# Patient Record
Sex: Female | Born: 1950 | Race: Black or African American | Hispanic: No | Marital: Married | Smoking: Never smoker
Health system: Southern US, Community
[De-identification: ages and names within clinical notes are randomized; demographics above are authoritative.]

## PROBLEM LIST (undated history)

## (undated) DIAGNOSIS — I82409 Acute embolism and thrombosis of unspecified deep veins of unspecified lower extremity: Secondary | ICD-10-CM

## (undated) DIAGNOSIS — I10 Essential (primary) hypertension: Secondary | ICD-10-CM

## (undated) DIAGNOSIS — E7800 Pure hypercholesterolemia, unspecified: Secondary | ICD-10-CM

## (undated) DIAGNOSIS — F329 Major depressive disorder, single episode, unspecified: Secondary | ICD-10-CM

## (undated) DIAGNOSIS — F32A Depression, unspecified: Secondary | ICD-10-CM

## (undated) DIAGNOSIS — Z8719 Personal history of other diseases of the digestive system: Secondary | ICD-10-CM

## (undated) DIAGNOSIS — M1990 Unspecified osteoarthritis, unspecified site: Secondary | ICD-10-CM

## (undated) HISTORY — PX: BACK SURGERY: SHX140

## (undated) HISTORY — PX: KNEE SURGERY: SHX244

## (undated) HISTORY — PX: ABDOMINAL HYSTERECTOMY: SHX81

## (undated) HISTORY — PX: APPENDECTOMY: SHX54

---

## 2006-09-09 ENCOUNTER — Ambulatory Visit (HOSPITAL_COMMUNITY): Admission: RE | Admit: 2006-09-09 | Discharge: 2006-09-09 | Payer: Self-pay | Admitting: Internal Medicine

## 2007-03-29 ENCOUNTER — Encounter: Admission: RE | Admit: 2007-03-29 | Discharge: 2007-03-29 | Payer: Self-pay | Admitting: Internal Medicine

## 2007-04-05 ENCOUNTER — Encounter: Admission: RE | Admit: 2007-04-05 | Discharge: 2007-04-05 | Payer: Self-pay | Admitting: Internal Medicine

## 2007-04-30 ENCOUNTER — Inpatient Hospital Stay (HOSPITAL_COMMUNITY): Admission: RE | Admit: 2007-04-30 | Discharge: 2007-05-03 | Payer: Self-pay | Admitting: Neurological Surgery

## 2007-06-01 ENCOUNTER — Encounter: Admission: RE | Admit: 2007-06-01 | Discharge: 2007-06-01 | Payer: Self-pay | Admitting: Neurological Surgery

## 2007-06-29 ENCOUNTER — Encounter: Admission: RE | Admit: 2007-06-29 | Discharge: 2007-06-29 | Payer: Self-pay | Admitting: Neurological Surgery

## 2007-10-19 ENCOUNTER — Emergency Department (HOSPITAL_BASED_OUTPATIENT_CLINIC_OR_DEPARTMENT_OTHER): Admission: EM | Admit: 2007-10-19 | Discharge: 2007-10-19 | Payer: Self-pay | Admitting: Emergency Medicine

## 2008-01-31 ENCOUNTER — Encounter: Admission: RE | Admit: 2008-01-31 | Discharge: 2008-01-31 | Payer: Self-pay | Admitting: Neurological Surgery

## 2008-06-06 ENCOUNTER — Encounter: Admission: RE | Admit: 2008-06-06 | Discharge: 2008-06-06 | Payer: Self-pay | Admitting: Neurological Surgery

## 2008-06-21 ENCOUNTER — Ambulatory Visit: Payer: Self-pay | Admitting: Diagnostic Radiology

## 2008-06-21 ENCOUNTER — Emergency Department (HOSPITAL_BASED_OUTPATIENT_CLINIC_OR_DEPARTMENT_OTHER): Admission: EM | Admit: 2008-06-21 | Discharge: 2008-06-21 | Payer: Self-pay | Admitting: Emergency Medicine

## 2008-09-17 ENCOUNTER — Emergency Department (HOSPITAL_BASED_OUTPATIENT_CLINIC_OR_DEPARTMENT_OTHER): Admission: EM | Admit: 2008-09-17 | Discharge: 2008-09-17 | Payer: Self-pay | Admitting: Emergency Medicine

## 2008-09-18 ENCOUNTER — Ambulatory Visit (HOSPITAL_BASED_OUTPATIENT_CLINIC_OR_DEPARTMENT_OTHER): Admission: RE | Admit: 2008-09-18 | Discharge: 2008-09-18 | Payer: Self-pay | Admitting: Emergency Medicine

## 2008-09-18 ENCOUNTER — Ambulatory Visit: Payer: Self-pay | Admitting: Diagnostic Radiology

## 2008-11-10 ENCOUNTER — Encounter (INDEPENDENT_AMBULATORY_CARE_PROVIDER_SITE_OTHER): Payer: Self-pay | Admitting: Orthopedic Surgery

## 2008-11-10 ENCOUNTER — Ambulatory Visit (HOSPITAL_BASED_OUTPATIENT_CLINIC_OR_DEPARTMENT_OTHER): Admission: RE | Admit: 2008-11-10 | Discharge: 2008-11-10 | Payer: Self-pay | Admitting: Orthopedic Surgery

## 2010-05-28 NOTE — Op Note (Signed)
NAMEMERLEAN, PIZZINI              ACCOUNT NO.:  [id number]   MEDICAL RECORD NO.:  [id number]          PATIENT TYPE:  INP   LOCATION:  3022                         FACILITY:  [facility]   PHYSICIAN:  Tia Alert, MD     DATE OF BIRTH:  [birth year]   DATE OF PROCEDURE:  04/30/2007  DATE OF DISCHARGE:                               OPERATIVE REPORT   PREOPERATIVE DIAGNOSIS:  Spondylolisthesis L4-L5 with spinal stenosis L4-  L5 with back and left leg pain.   POSTOPERATIVE DIAGNOSIS:  Spondylolisthesis L4-L5 with spinal stenosis  L4-L5 with back and left leg pain.   PROCEDURES:  1. Decompressive lumbar hemilaminectomy, hemi facetectomy, and      foraminotomies of L4-L5 with decompression of the L4 and the L5      nerve roots requiring more work than is typically required in      posterior lumbar interbody fusion procedure.  2. Posterior lumbar interbody fusion L4-L5 utilizing 10 x 22-mm PEEK      interbody cage packed with local autograft and Actifuse and a 10 x      22-mm Tangent interbody bone wedge.  3. Intertransverse arthrodesis L4-L5 utilizing locally harvested      morselized autologous bone graft and Actifuse putty.  4. Nonsegmental fixation L4-L5 utilizing the Legacy pedicle screw      system.   SURGEON:  Marikay Alar, MD.   ASSISTANT:  Kathaleen Maser. Pool, M.D.   ANESTHESIA:  General endotracheal.   COMPLICATIONS:  None apparent.   INDICATIONS FOR PROCEDURE:  Ms. Lindamood is a 60 year old female who is  referred with back pain and severe left leg pain.  She had an MRI which  showed significant degenerative disk disease at L5-S1 with Modic  endplate changes and a broad-based disk bulge with a degenerative  anterolisthesis of L4-L5 with stenosis at that level.  She had more of  an L5 radiculopathy on the left side.  I recommended a two-level placed  at L4-L5 and L5-S1.  She understood the risks, benefits, and expected  outcome and wished to proceed.   FINDINGS AT OPERATION:  At  the time of operation, we felt like the L5-S1  level was quite stable and likely was fused at the facet level.  We  found no movement when pulling on the spinous process with a Leksell  rongeur.  She did not have significant nerve compression at that level  though she did have degenerative disk disease but did not have a long  history of chronic mechanical diskogenic-like back pain and therefore,  we felt it was reasonable to proceed with PLIF at only L4-L5 where she  had segmental instability and spinal stenosis.  This was an  intraoperative decision.   DESCRIPTION OF PROCEDURE:  The patient was taken to the operating room  and after induction of adequate generalized endotracheal anesthesia, she  was placed in prone position on chest rolls and all pressure points were  padded.  Her lumbar region was prepped with DuraPrep and then draped in  the usual sterile fashion.  A 10 mL of local anesthesia was injected and  a dorsal midline incision was made and carried down through the  lumbosacral fascia.  The fascia was opened and the paraspinous  musculature was taken down in subperiosteal fashion to expose the L4-L5  and L5-S1.  Intraoperative fluoroscopy confirmed the level.  We pulled  on the spinous processes with Leksell rongeur.  The L4-L5 level was  quite loose, however, L5-S1 seemed to not move at all.  It seemed to be  fused at the facet level and the  fluoroscopic images made it look like  it was fused by bridging osteophyte at L5-S1.  We considered our  options.  We decided it was probably less risky to leave this alone and  not perform a decompression and an instrumented fusion at this level and  simply move forward with a decompression instrumented fusion at the L4-  L5 level where she had segmental instability and spinal stenosis,  especially given her preoperative symptoms.  Therefore, we removed the  spinous process of L4 and then utilizing the Kerrison punch and Leksell   rongeur, completed the laminectomy, hemi facetectomy, and foraminotomies  at L4-L5.  The underlying yellow ligament was opened and removed to  expose the underlying dura.  The L4 and L5 nerve roots were identified  and we marched along the pedicle walls to decompress these distally into  their respective foramina.  We coagulated the epidural venous  vasculature and identified our disk space.  We incised the disk space  and used sequential distraction to distract the disk space up to 10 mm  and had a fairly nice reduction of her spondylolisthesis.  We then used  the rotating cutter, round scraper and a 10-mm cutting chisel to prepare  the endplates for PLIF.  We prepared the midline with an Epstein  curette.  Once the complete diskectomy was performed, we placed a 10 x  22-mm Tangent interbody bone wedge on the patient's left side and 10 x  22-mm PEEK interbody cage packed with local autograft  and Actifuse on  the patient's right side and the midline was packed with local autograft  and Actifuse.  We then turned our attention to the nonsegmental  fixation.  We localized the pedicle screw entry zones at L4-L5 utilizing  surface landmarks and lateral fluoroscopy.  We probed each pedicle with  a pedicle probe, tapped each pedicle with a 5-5 tap then placed a 65 x  45-mm pedicle screws into the pedicles of L4-L5 after probing the  pedicles of the ball probe to assure no break in the cortex.  We then  decorticated the transverse processes and laid a mixture of autograft  and Actifuse out over these to complete our intertransverse arthrodesis  L4-L5.  We then placed lordotic rods into the multiaxial screw heads of  the pedicle screws and locked these into position with locking caps and  anti-torque device after achieving compression of our grafts.  We then  irrigated with saline solution containing bacitracin, dried all bleeding  points, lined the dura with Gelfoam, placed a medium Hemovac  drain  through a separate stab incision and then closed the muscle and the  fascia with 0 Vicryl, closed the subcutaneous and subcuticular tissue  with 2-0 and 3-0 Vicryl, and closed the skin with benzoin and Steri-  Strips.  The drapes were removed.  A sterile dressing was applied.  The  patient was awakened from general anesthesia and transferred to the  recovery room in stable condition.  At the end of the  procedure, all  sponge, needle, and instrument counts were correct.      Tia Alert, MD  Electronically Signed     DSJ/MEDQ  D:  04/30/2007  T:  05/01/2007  Job:  [id number]

## 2010-05-28 NOTE — Discharge Summary (Signed)
Kristin Anthony, Kristin Anthony              ACCOUNT NO.:  [id number]   MEDICAL RECORD NO.:  [id number]          PATIENT TYPE:  INP   LOCATION:  3022                         FACILITY:  [facility]   PHYSICIAN:  Tia Alert, MD     DATE OF BIRTH:  11/24/1950   DATE OF ADMISSION:  04/30/2007  DATE OF DISCHARGE:  05/03/2007                               DISCHARGE SUMMARY   ADMISSION DIAGNOSES:  1. Spondylolisthesis, L4-5 with spinal stenosis.  2. Back and left leg pain.   PROCEDURE:  Posterior lumbar interbody fusion, L4-5.   SECONDARY DIAGNOSIS:  Diabetes mellitus.   BRIEF HISTORY OF PRESENT ILLNESS:  Ms. Knouff is a 60 year old female  who is referred with back and severe left leg pain.  She had an MRI,  which showed degenerative disk disease at L5-S1 with motor endplate  changes and a anterior listhesis at L4-5 with associated stenosis.  She  had a left L5 radiculopathy.  We recommended a posterior lumbar  interbody fusion at L4-5.  She understood the risks, benefits, and  suspected outcome and wished to proceed.   HOSPITAL COURSE:  The patient was admitted on April 30, 2007, and taken  to the operating room where she underwent a posterior lumbar interbody  fusion at L4-5.  The patient tolerated the procedure well and taken to  recovery room and then to the floor in stable condition.  For details of  the operative procedure, please see the dictated operative note.  The  patient's hospital course was routine.  There were no complications.  She spent the first night at bedrest with a Dilaudid PCA protocol.  Foley catheter in place.  She was started on a sliding-scale insulin for  her diabetes mellitus and she was continued on her home Lantus and  Avandia.  She did very well following surgery.  She had improvement in  her radiculopathy.  She had appropriate back soreness.  She was allowed  out of bed on postop day #1, in a horseshoe brace.  She ambulated well  without difficulty.  Her incision  remained clean, dry, and intact.  She  was advanced to a regular diet, which she tolerated well.  Her blood  sugars ran in the low 200s to mid 100 and was nicely covered with  sliding-scale insulin.  Her Dilaudid PCA was discontinued on postop day  #2 and she did well with this on oral pain medications rating her back  pain about of 3/10 on the pain scale.  She denied any leg pain or any  numbness, tingling, or weakness.  Her incision remained clean, dry, and  intact.  She was afebrile with stable vital signs.  She was discharged  home in stable condition on May 03, 2007, with plans to follow up in 2  weeks.   FINAL DIAGNOSIS:  Posterior lumbar interbody fusion at L4-5.      Tia Alert, MD  Electronically Signed     DSJ/MEDQ  D:  05/03/2007  T:  05/03/2007  Job:  [id number]

## 2010-10-08 LAB — CBC
MCV: 91.8
Platelets: 202

## 2010-10-08 LAB — DIFFERENTIAL
Basophils Absolute: 0
Basophils Relative: 1
Lymphocytes Relative: 44
Monocytes Relative: 11
Neutro Abs: 2.6
Neutrophils Relative %: 41 — ABNORMAL LOW

## 2010-10-08 LAB — APTT: aPTT: 29

## 2010-10-08 LAB — BASIC METABOLIC PANEL
CO2: 25
Chloride: 102
GFR calc Af Amer: >60
Glucose, Bld: 155 — ABNORMAL HIGH
Potassium: 4.2
Sodium: 135

## 2010-10-08 LAB — POCT I-STAT GLUCOSE: Operator id: 125961

## 2010-10-08 LAB — TYPE AND SCREEN: ABO/RH(D): O POS

## 2010-10-08 LAB — PROTIME-INR
INR: 0.9
Prothrombin Time: 12.5

## 2010-10-12 ENCOUNTER — Emergency Department (HOSPITAL_BASED_OUTPATIENT_CLINIC_OR_DEPARTMENT_OTHER)
Admission: EM | Admit: 2010-10-12 | Discharge: 2010-10-12 | Disposition: A | Discharge status: Home / Self Care | Payer: Managed Care, Other (non HMO) | Attending: Emergency Medicine | Admitting: Emergency Medicine

## 2010-10-12 ENCOUNTER — Encounter: Payer: Self-pay | Admitting: *Deleted

## 2010-10-12 ENCOUNTER — Emergency Department (HOSPITAL_BASED_OUTPATIENT_CLINIC_OR_DEPARTMENT_OTHER): Payer: Managed Care, Other (non HMO)

## 2010-10-12 ENCOUNTER — Emergency Department (INDEPENDENT_AMBULATORY_CARE_PROVIDER_SITE_OTHER): Payer: Managed Care, Other (non HMO)

## 2010-10-12 DIAGNOSIS — M25559 Pain in unspecified hip: Secondary | ICD-10-CM

## 2010-10-12 DIAGNOSIS — I10 Essential (primary) hypertension: Secondary | ICD-10-CM | POA: Insufficient documentation

## 2010-10-12 DIAGNOSIS — E119 Type 2 diabetes mellitus without complications: Secondary | ICD-10-CM | POA: Insufficient documentation

## 2010-10-12 DIAGNOSIS — M5430 Sciatica, unspecified side: Secondary | ICD-10-CM | POA: Insufficient documentation

## 2010-10-12 DIAGNOSIS — X500XXA Overexertion from strenuous movement or load, initial encounter: Secondary | ICD-10-CM

## 2010-10-12 DIAGNOSIS — R109 Unspecified abdominal pain: Secondary | ICD-10-CM

## 2010-10-12 DIAGNOSIS — E7800 Pure hypercholesterolemia, unspecified: Secondary | ICD-10-CM | POA: Insufficient documentation

## 2010-10-12 HISTORY — DX: Essential (primary) hypertension: I10

## 2010-10-12 HISTORY — DX: Pure hypercholesterolemia, unspecified: E78.00

## 2010-10-12 MED ORDER — HYDROCODONE-ACETAMINOPHEN 5-500 MG PO TABS: 1.0000 | ORAL_TABLET | Freq: Four times a day (QID) | ORAL | Status: AC | PRN

## 2010-10-12 MED ORDER — PREDNISONE 10 MG PO TABS: 20.0000 mg | ORAL_TABLET | Freq: Every day | ORAL | Status: AC

## 2010-10-12 MED ORDER — KETOROLAC TROMETHAMINE 60 MG/2ML IM SOLN
60.0000 mg | Freq: Once | INTRAMUSCULAR | Status: AC
  Administered 2010-10-12: 60 mg via INTRAMUSCULAR
  Filled 2010-10-12: qty 2

## 2010-10-12 MED ORDER — OXYCODONE-ACETAMINOPHEN 5-325 MG PO TABS
2.0000 | ORAL_TABLET | Freq: Once | ORAL | Status: AC
  Administered 2010-10-12: 2 via ORAL
  Filled 2010-10-12: qty 2

## 2010-10-12 NOTE — ED Notes (Signed)
C/o right hip and side pain after doing heavy lifting

## 2010-10-12 NOTE — ED Provider Notes (Signed)
History     CSN: [id number] Arrival date & time: 10/12/2010  2:41 AM  Chief Complaint  Patient presents with  . Hip Pain    (Consider location/radiation/quality/duration/timing/severity/associated sxs/prior treatment) HPI Comments: Has been doing housework and carrying a heavy vacuum cleaner up the stairs.  No having bad pain in right buttock and hip.  Patient is a 60 y.o. female presenting with hip pain. The history is provided by the patient.  Hip Pain This is a new problem. The current episode started 2 days ago. The problem occurs constantly. The problem has been rapidly worsening. Pertinent negatives include no chest pain and no shortness of breath. The symptoms are aggravated by bending, standing and walking. The symptoms are relieved by nothing. She has tried ASA for the symptoms.    Past Medical History  Diagnosis Date  . Hypertension   . Diabetes mellitus   . High cholesterol     Past Surgical History  Procedure Date  . Back surgery   . Abdominal hysterectomy   . Appendectomy     History reviewed. No pertinent family history.  History  Substance Use Topics  . Smoking status: Never Smoker   . Smokeless tobacco: Not on file  . Alcohol Use: No    OB History    Grav Para Term Preterm Abortions TAB SAB Ect Mult Living                  Review of Systems  Respiratory: Negative for shortness of breath.   Cardiovascular: Negative for chest pain.  All other systems reviewed and are negative.    Allergies  Review of patient's allergies indicates not on file.  Home Medications   Current Outpatient Rx  Name Route Sig Dispense Refill  . AMLODIPINE BESY-BENAZEPRIL HCL 10-20 MG PO CAPS Oral Take 1 capsule by mouth daily.      Marland Kitchen ESCITALOPRAM OXALATE 10 MG PO TABS Oral Take 10 mg by mouth daily.      . INSULIN GLARGINE 100 UNIT/ML [place] SOLN Subcutaneous Inject into the skin at bedtime.      Marland Kitchen SIMVASTATIN 10 MG PO TABS Oral Take 10 mg by mouth at bedtime.         BP 122/67  Pulse 82  Temp(Src) 98.1 F (36.7 C) (Oral)  Resp 18  Ht 5\' 1"  (1.549 m)  Wt 188 lb (85.276 kg)  BMI 35.52 kg/m2  SpO2 100%  Physical Exam  Constitutional: She is oriented to person, place, and time. She appears well-developed and well-nourished.       Appears uncomfortable.  HENT:  Head: Normocephalic and atraumatic.  Neck: Normal range of motion. Neck supple.  Musculoskeletal:       There is ttp over the right hip and buttock along with pain with range of motion.    Neurological: She is alert and oriented to person, place, and time.       Dtr's are 2+ and equal in the ble.  Strength is 5/5 in the ble.    ED Course  Procedures (including critical care time)  Labs Reviewed - No data to display No results found.   No diagnosis found.    MDM  Xrays show chronic changes but no acute process.  Pain sounds like sciatica to me.          Geoffery Lyons, MD 10/12/10 [phone]

## 2011-02-12 ENCOUNTER — Other Ambulatory Visit: Payer: Self-pay | Admitting: Gastroenterology

## 2011-03-18 ENCOUNTER — Emergency Department (INDEPENDENT_AMBULATORY_CARE_PROVIDER_SITE_OTHER): Payer: Managed Care, Other (non HMO)

## 2011-03-18 ENCOUNTER — Emergency Department (HOSPITAL_BASED_OUTPATIENT_CLINIC_OR_DEPARTMENT_OTHER)
Admission: EM | Admit: 2011-03-18 | Discharge: 2011-03-18 | Disposition: A | Discharge status: Home / Self Care | Payer: Managed Care, Other (non HMO) | Attending: Emergency Medicine | Admitting: Emergency Medicine

## 2011-03-18 DIAGNOSIS — I10 Essential (primary) hypertension: Secondary | ICD-10-CM | POA: Insufficient documentation

## 2011-03-18 DIAGNOSIS — R109 Unspecified abdominal pain: Secondary | ICD-10-CM

## 2011-03-18 DIAGNOSIS — E7800 Pure hypercholesterolemia, unspecified: Secondary | ICD-10-CM | POA: Insufficient documentation

## 2011-03-18 DIAGNOSIS — R110 Nausea: Secondary | ICD-10-CM | POA: Insufficient documentation

## 2011-03-18 DIAGNOSIS — K7689 Other specified diseases of liver: Secondary | ICD-10-CM | POA: Insufficient documentation

## 2011-03-18 DIAGNOSIS — Z9079 Acquired absence of other genital organ(s): Secondary | ICD-10-CM | POA: Insufficient documentation

## 2011-03-18 DIAGNOSIS — K760 Fatty (change of) liver, not elsewhere classified: Secondary | ICD-10-CM

## 2011-03-18 DIAGNOSIS — E119 Type 2 diabetes mellitus without complications: Secondary | ICD-10-CM | POA: Insufficient documentation

## 2011-03-18 DIAGNOSIS — R10811 Right upper quadrant abdominal tenderness: Secondary | ICD-10-CM | POA: Insufficient documentation

## 2011-03-18 LAB — COMPREHENSIVE METABOLIC PANEL
ALT: 11 U/L (ref 0–35)
AST: 14 U/L (ref 0–37)
Albumin: 3.6 g/dL (ref 3.5–5.2)
Alkaline Phosphatase: 77 U/L (ref 39–117)
BUN: 14 mg/dL (ref 6–23)
Chloride: 103 mEq/L (ref 96–112)
Potassium: 3.9 mEq/L (ref 3.5–5.1)
Sodium: 137 mEq/L (ref 135–145)
Total Bilirubin: 0.2 mg/dL — ABNORMAL LOW (ref 0.3–1.2)
Total Protein: 7.1 g/dL (ref 6.0–8.3)

## 2011-03-18 LAB — CBC
HCT: 37 % (ref 36.0–46.0)
Hemoglobin: 12.2 g/dL (ref 12.0–15.0)
MCHC: 33 g/dL (ref 30.0–36.0)
MCV: 91.8 fL (ref 78.0–100.0)
RDW: 12.1 % (ref 11.5–15.5)

## 2011-03-18 LAB — LIPASE, BLOOD: Lipase: 40 U/L (ref 11–59)

## 2011-03-18 LAB — DIFFERENTIAL
Basophils Absolute: 0 10*3/uL (ref 0.0–0.1)
Basophils Relative: 1 % (ref 0–1)
Eosinophils Relative: 3 % (ref 0–5)
Monocytes Absolute: 0.7 10*3/uL (ref 0.1–1.0)
Monocytes Relative: 9 % (ref 3–12)
Neutro Abs: 3.2 10*3/uL (ref 1.7–7.7)

## 2011-03-18 LAB — URINALYSIS, ROUTINE W REFLEX MICROSCOPIC
Bilirubin Urine: NEGATIVE
Leukocytes, UA: NEGATIVE
Nitrite: NEGATIVE
Specific Gravity, Urine: 1.023 (ref 1.005–1.030)
Urobilinogen, UA: 2 mg/dL — ABNORMAL HIGH (ref 0.0–1.0)
pH: 6.5 (ref 5.0–8.0)

## 2011-03-18 MED ORDER — MORPHINE SULFATE 4 MG/ML IJ SOLN
4.0000 mg | Freq: Once | INTRAMUSCULAR | Status: AC
  Administered 2011-03-18: 4 mg via INTRAVENOUS
  Filled 2011-03-18: qty 1

## 2011-03-18 MED ORDER — HYDROCODONE-ACETAMINOPHEN 5-500 MG PO TABS: 1.0000 | ORAL_TABLET | Freq: Four times a day (QID) | ORAL | Status: AC | PRN

## 2011-03-18 MED ORDER — ONDANSETRON HCL 4 MG/2ML IJ SOLN
4.0000 mg | Freq: Once | INTRAMUSCULAR | Status: AC
  Administered 2011-03-18: 4 mg via INTRAVENOUS
  Filled 2011-03-18: qty 2

## 2011-03-18 NOTE — ED Provider Notes (Signed)
History     CSN: [id number]  Arrival date & time 03/18/11  1503   First MD Initiated Contact with Patient 03/18/11 1521      Chief Complaint  Patient presents with  . Abdominal Pain    Pt. reports after she eats she has onset of R side abd. pain.    (Consider location/radiation/quality/duration/timing/severity/associated sxs/prior treatment) HPI Comments: Pt states that she has had intermittent right sided upper abdominal pain over the last 2 months, but it seems to be increasing in nature:pt states that it is worse with eating although she is unsure if it is all food or certain foods  Patient is a 61 y.o. female presenting with abdominal pain. The history is provided by the patient. No language interpreter was used.  Abdominal Pain The primary symptoms of the illness include abdominal pain and nausea. The primary symptoms of the illness do not include fever, vomiting or diarrhea. The current episode started more than 2 days ago. The onset of the illness was gradual.  Symptoms associated with the illness do not include urgency, frequency or back pain.    Past Medical History  Diagnosis Date  . Hypertension   . Diabetes mellitus   . High cholesterol     Past Surgical History  Procedure Date  . Back surgery   . Abdominal hysterectomy   . Appendectomy     No family history on file.  History  Substance Use Topics  . Smoking status: Never Smoker   . Smokeless tobacco: Not on file  . Alcohol Use: No    OB History    Grav Para Term Preterm Abortions TAB SAB Ect Mult Living                  Review of Systems  Constitutional: Negative for fever.  Gastrointestinal: Positive for nausea and abdominal pain. Negative for vomiting and diarrhea.  Genitourinary: Negative for urgency and frequency.  Musculoskeletal: Negative for back pain.  All other systems reviewed and are negative.    Allergies  Codeine  Home Medications   Current Outpatient Rx  Name Route Sig  Dispense Refill  . METFORMIN HCL 1000 MG PO TABS Oral Take 1,000 mg by mouth 2 (two) times daily with a meal.    . PIOGLITAZONE HCL 15 MG PO TABS Oral Take 15 mg by mouth daily.    Marland Kitchen AMLODIPINE BESY-BENAZEPRIL HCL 10-20 MG PO CAPS Oral Take 1 capsule by mouth daily.      Marland Kitchen ESCITALOPRAM OXALATE 10 MG PO TABS Oral Take 10 mg by mouth daily.      . INSULIN GLARGINE 100 UNIT/ML [place] SOLN Subcutaneous Inject into the skin at bedtime.      Marland Kitchen SIMVASTATIN 10 MG PO TABS Oral Take 10 mg by mouth at bedtime.        BP 137/62  Pulse 83  Temp(Src) 98.3 F (36.8 C) (Oral)  Resp 18  Ht 5\' 9"  (1.753 m)  Wt 188 lb (85.276 kg)  BMI 27.76 kg/m2  SpO2 98%  Physical Exam  Nursing note and vitals reviewed. Constitutional: She is oriented to person, place, and time. She appears well-developed and well-nourished.  HENT:  Head: Normocephalic and atraumatic.  Cardiovascular: Normal rate and regular rhythm.   Pulmonary/Chest: Effort normal.  Abdominal: Soft. Bowel sounds are normal. There is tenderness in the right upper quadrant.  Musculoskeletal: Normal range of motion.  Neurological: She is alert and oriented to person, place, and time.  Skin: Skin is warm  and dry.  Psychiatric: She has a normal mood and affect.    ED Course  Procedures (including critical care time)  Labs Reviewed  URINALYSIS, ROUTINE W REFLEX MICROSCOPIC - Abnormal; Notable for the following:    Urobilinogen, UA 2.0 (*)    All other components within normal limits  COMPREHENSIVE METABOLIC PANEL - Abnormal; Notable for the following:    Glucose, Bld 170 (*)    Total Bilirubin 0.2 (*)    GFR calc non Af Amer 67 (*)    GFR calc Af Amer 78 (*)    All other components within normal limits  LIPASE, BLOOD  CBC  DIFFERENTIAL   US Abdomen Complete  03/18/2011  *RADIOLOGY REPORT*  Clinical Data:  Post prandial right-sided abdominal pain for the past 2 months.  COMPLETE ABDOMINAL ULTRASOUND  Comparison:  09/09/2006  Findings:   Gallbladder:  No gallstones, gallbladder wall thickening, or pericholecystic fluid.  Common bile duct:  Measures up to 6 mm in diameter, within normal limits.  Liver:  Echogenic, suggesting diffuse hepatic steatosis.  No focal lesion observed.  IVC:  Normal where visualized; portions not well seen due to overlying bowel gas.  Pancreas:  Not well seen due to overlying bowel gas.  Spleen:  Measures 5.1 cm craniocaudad and appears normal.  Right Kidney:  Measures 11.2 cm in length and appears normal.  Left Kidney:  Measures 11.8 cm in length and appears normal.  Abdominal aorta:  Normal distally, not well seen proximally or midportion.  IMPRESSION:  1.  Echogenic liver, suggesting diffuse hepatic steatosis. 2.  Poor visualization of portions of the pancreas, abdominal aorta, and IVC due to overlying bowel gas. 3.   Otherwise, no significant abnormality identified.  Original Report Authenticated By: Dellia Cloud, M.D.     1. Abdominal pain   2. Hepatic steatosis       MDM  Discussed fatty liver with pt:no other acute finding:will send home and treat pt symptomatically       Teressa Lower, NP 03/18/11 1738

## 2011-03-18 NOTE — Discharge Instructions (Signed)
Abdominal Pain Abdominal pain can be caused by many things. Your caregiver decides the seriousness of your pain by an examination and possibly blood tests and X-rays. Many cases can be observed and treated at home. Most abdominal pain is not caused by a disease and will probably improve without treatment. However, in many cases, more time must pass before a clear cause of the pain can be found. Before that point, it may not be known if you need more testing, or if hospitalization or surgery is needed. HOME CARE INSTRUCTIONS   Do not take laxatives unless directed by your caregiver.   Take pain medicine only as directed by your caregiver.   Only take over-the-counter or prescription medicines for pain, discomfort, or fever as directed by your caregiver.   Try a clear liquid diet (broth, tea, or water) for as long as directed by your caregiver. Slowly move to a bland diet as tolerated.  SEEK IMMEDIATE MEDICAL CARE IF:   The pain does not go away.   You have a fever.   You keep throwing up (vomiting).   The pain is felt only in portions of the abdomen. Pain in the right side could possibly be appendicitis. In an adult, pain in the left lower portion of the abdomen could be colitis or diverticulitis.   You pass bloody or black tarry stools.  MAKE SURE YOU:   Understand these instructions.   Will watch your condition.   Will get help right away if you are not doing well or get worse.  Document Released: 10/09/2004 Document Revised: 12/19/2010 Document Reviewed: 08/18/2007 [facility] Patient Information 2012 [place], [place].Fatty Liver Fatty liver is the accumulation of fat in liver cells. It is also called hepatosteatosis or steatohepatitis. It is normal for your liver to contain some fat. If fat is more than 5 to 10% of your liver's weight, you have fatty liver.  There are often no symptoms (problems) for years while damage is still occurring. People often learn about their fatty liver when  they have medical tests for other reasons. Fat can damage your liver for years or even decades without causing problems. When it becomes severe, it can cause fatigue, weight loss, weakness, and confusion. This makes you more likely to develop more serious liver problems. The liver is the largest organ in the body. It does a lot of work and often gives no warning signs when it is sick until late in a disease. The liver has many important jobs including:  Breaking down foods.   Storing vitamins, iron, and other minerals.   Making proteins.   Making bile for food digestion.   Breaking down many products including medications, alcohol and some poisons.  CAUSES  There are a number of different conditions, medications, and poisons that can cause a fatty liver. Eating too many calories causes fat to build up in the liver. Not processing and breaking fats down normally may also cause this. Certain conditions, such as obesity, diabetes, and high triglycerides also cause this. Most fatty liver patients tend to be middle-aged and over weight.  Some causes of fatty liver are:  Alcohol over consumption.   Malnutrition.   Steroid use.   Valproic acid toxicity.   Obesity.   Cushing's syndrome.   Poisons.   Tetracycline in high dosages.   Pregnancy.   Diabetes.   Hyperlipidemia.   Rapid weight loss.  Some people develop fatty liver even having none of these conditions. SYMPTOMS  Fatty liver most often causes  no problems. This is called asymptomatic.  It can be diagnosed with blood tests and also by a liver biopsy.   It is one of the most common causes of minor elevations of liver enzymes on routine blood tests.   Specialized Imaging of the liver using ultrasound, CT (computed tomography) scan, or MRI (magnetic resonance imaging) can suggest a fatty liver but a biopsy is needed to confirm it.   A biopsy involves taking a small sample of liver tissue. This is done by using a needle.  It is then looked at under a microscope by a specialist.  TREATMENT  It is important to treat the cause. Simple fatty liver without a medical reason may not need treatment.  Weight loss, fat restriction, and exercise in overweight patients produces inconsistent results but is worth trying.   Fatty liver due to alcohol toxicity may not improve even with stopping drinking.   Good control of diabetes may reduce fatty liver.   Lower your triglycerides through diet, medication or both.   Eat a balanced, healthy diet.   Increase your physical activity.   Get regular checkups from a liver specialist.   There are no medical or surgical treatments for a fatty liver or NASH, but improving your diet and increasing your exercise may help prevent or reverse some of the damage.  PROGNOSIS  Fatty liver may cause no damage or it can lead to an inflammation of the liver. This is, called steatohepatitis. When it is linked to alcohol abuse, it is called alcoholic steatohepatitis. It often is not linked to alcohol. It is then called nonalcoholic steatohepatitis, or NASH. Over time the liver may become scarred and hardened. This condition is called cirrhosis. Cirrhosis is serious and may lead to liver failure or cancer. NASH is one of the leading causes of cirrhosis. About 10-20% of Americans have fatty liver and a smaller 2-5% has NASH. Document Released: 02/14/2005 Document Revised: 12/19/2010 Document Reviewed: 04/09/2005 [facility] Patient Information 2012 [place], [place].

## 2011-03-18 NOTE — ED Notes (Signed)
Pt. Reports she is in pain in the R side of the abd. At present time.  Pt. Ate lunch at approx. 1430 today.  Pt. Reports she ate grits today.

## 2011-03-19 NOTE — ED Provider Notes (Signed)
Medical screening examination/treatment/procedure(s) were performed by non-physician practitioner and as supervising physician I was immediately available for consultation/collaboration.   Geoffery Lyons, MD 03/19/11 [phone]

## 2011-07-25 ENCOUNTER — Emergency Department (HOSPITAL_BASED_OUTPATIENT_CLINIC_OR_DEPARTMENT_OTHER)
Admission: EM | Admit: 2011-07-25 | Discharge: 2011-07-25 | Disposition: A | Discharge status: Home / Self Care | Payer: Managed Care, Other (non HMO) | Attending: Emergency Medicine | Admitting: Emergency Medicine

## 2011-07-25 ENCOUNTER — Encounter (HOSPITAL_BASED_OUTPATIENT_CLINIC_OR_DEPARTMENT_OTHER): Payer: Self-pay

## 2011-07-25 DIAGNOSIS — Z79899 Other long term (current) drug therapy: Secondary | ICD-10-CM | POA: Insufficient documentation

## 2011-07-25 DIAGNOSIS — E7800 Pure hypercholesterolemia, unspecified: Secondary | ICD-10-CM | POA: Insufficient documentation

## 2011-07-25 DIAGNOSIS — I10 Essential (primary) hypertension: Secondary | ICD-10-CM | POA: Insufficient documentation

## 2011-07-25 DIAGNOSIS — Z794 Long term (current) use of insulin: Secondary | ICD-10-CM | POA: Insufficient documentation

## 2011-07-25 DIAGNOSIS — E119 Type 2 diabetes mellitus without complications: Secondary | ICD-10-CM | POA: Insufficient documentation

## 2011-07-25 DIAGNOSIS — M7660 Achilles tendinitis, unspecified leg: Secondary | ICD-10-CM

## 2011-07-25 MED ORDER — NAPROXEN 500 MG PO TABS: 500.0000 mg | ORAL_TABLET | Freq: Two times a day (BID) | ORAL | Status: AC

## 2011-07-25 MED ORDER — HYDROCODONE-ACETAMINOPHEN 5-325 MG PO TABS: ORAL_TABLET | ORAL | Status: AC

## 2011-07-25 MED ORDER — IBUPROFEN 400 MG PO TABS
600.0000 mg | ORAL_TABLET | Freq: Once | ORAL | Status: AC
  Administered 2011-07-25: 600 mg via ORAL
  Filled 2011-07-25: qty 1

## 2011-07-25 NOTE — ED Notes (Signed)
MD at bedside. 

## 2011-07-25 NOTE — Discharge Instructions (Signed)
 Achilles Tendinitis Tendinitis a swelling and soreness of the tendon. The pain in the tendon (cord-like structure which attaches muscle to bone) is produced by tiny tears and the inflammation present in that tendon. It commonly occurs at the shoulders, heels, and elbows. It is usually caused by overusing the tendon and joint involved. Achilles tendinitis involves the Achilles tendon. This is the large tendon in the back of the leg just above the foot. It attaches the large muscles of the lower leg to the heel bone (called calcaneus).  This diagnosis (learning what is wrong) is made by examination. X-rays will be generally be normal if only tendinitis is present. HOME CARE INSTRUCTIONS   Apply ice to the injury for 15 to 20 minutes, 3 to 4 times per day. Put the ice in a plastic bag and place a towel between the bag of ice and your skin.   Try to avoid use other than gentle range of motion while the tendon is painful. Do not resume use until instructed by your caregiver. Then begin use gradually. Do not increase use to the point of pain. If pain does develop, decrease use and continue the above measures. Gradually increase activities that do not cause discomfort until you gradually achieve normal use.   Only take over-the-counter or prescription medicines for pain, discomfort, or fever as directed by your caregiver.  SEEK MEDICAL CARE IF:   Your pain and swelling increase or pain is uncontrolled with medications.   You develop new, unexplained problems (symptoms) or an increase of the symptoms that brought you to your caregiver.   You develop an inability to move your toes or foot, develop warmth and swelling in your foot, or begin running an unexplained temperature.  MAKE SURE YOU:   Understand these instructions.   Will watch your condition.   Will get help right away if you are not doing well or get worse.  Document Released: 10/09/2004 Document Revised: 12/19/2010 Document Reviewed:  08/18/2007 [facility] Patient Information 2012 [place], [place].    Narcotic and benzodiazepine use may cause drowsiness, slowed breathing or dependence.  Please use with caution and do not drive, operate machinery or watch young children alone while taking them.  Taking combinations of these medications or drinking alcohol  will potentiate these effects.

## 2011-07-25 NOTE — ED Provider Notes (Signed)
History     CSN: [id number]  Arrival date & time 07/25/11  1002   First MD Initiated Contact with Patient 07/25/11 1009      Chief Complaint  Patient presents with  . Ankle Pain  . Foot Pain    (Consider location/radiation/quality/duration/timing/severity/associated sxs/prior treatment) HPI Comments: Pt reports severe pain to back of ankle along achilles of left foot without injury.  No abrasions, laceration, no twist of ankle.  She is disabled due to back problems, denies running, jumping, lifting or pushing anything sig.  No h/o gout.  No redness to skin.  She had wrapped it, put ice on it without much relief.  Lasting for 1+ days.   No other joint or limb problems.  Denies numbness or weakness.  Movement of ankle and letting leg hang down makes worse.    Patient is a 61 y.o. female presenting with ankle pain and lower extremity pain. The history is provided by the patient.  Ankle Pain  Pertinent negatives include no numbness.  Foot Pain    Past Medical History  Diagnosis Date  . Hypertension   . Diabetes mellitus   . High cholesterol     Past Surgical History  Procedure Date  . Back surgery   . Abdominal hysterectomy   . Appendectomy     History reviewed. No pertinent family history.  History  Substance Use Topics  . Smoking status: Never Smoker   . Smokeless tobacco: Not on file  . Alcohol Use: No    OB History    Grav Para Term Preterm Abortions TAB SAB Ect Mult Living                  Review of Systems  Constitutional: Negative for fever and chills.  Musculoskeletal: Positive for joint swelling and arthralgias.  Skin: Negative for color change, rash and wound.  Neurological: Positive for weakness. Negative for numbness.    Allergies  Codeine  Home Medications   Current Outpatient Rx  Name Route Sig Dispense Refill  . AMLODIPINE BESY-BENAZEPRIL HCL 10-20 MG PO CAPS Oral Take 1 capsule by mouth daily.      . ASPIRIN 81 MG PO TABS Oral Take 81  mg by mouth daily.    Marland Kitchen ESCITALOPRAM OXALATE 10 MG PO TABS Oral Take 10 mg by mouth daily.      Marland Kitchen HYDROCODONE-ACETAMINOPHEN 5-325 MG PO TABS  1-2 tablets po q 6 hours prn moderate to severe pain 20 tablet 0  . INSULIN GLARGINE 100 UNIT/ML [place] SOLN Subcutaneous Inject 32 Units into the skin at bedtime.     Marland Kitchen NAPROXEN 500 MG PO TABS Oral Take 1 tablet (500 mg total) by mouth 2 (two) times daily. Take with food to help prevent upset stomach. Discontinue if severe abdominal pain occurs. 20 tablet 0  . PIOGLITAZONE HCL-METFORMIN HCL 15-500 MG PO TABS Oral Take 1 tablet by mouth 2 (two) times daily with a meal.    . SIMVASTATIN 10 MG PO TABS Oral Take 10 mg by mouth at bedtime.        BP 128/73  Pulse 107  Temp 98.2 F (36.8 C) (Oral)  Resp 16  Ht 5\' 7"  (1.702 m)  Wt 188 lb (85.276 kg)  BMI 29.44 kg/m2  SpO2 98%  Physical Exam  Nursing note and vitals reviewed. Constitutional: She appears well-developed and well-nourished.  Cardiovascular:  Pulses:      Dorsalis pedis pulses are 2+ on the left side.  Posterior tibial pulses are 2+ on the left side.  Musculoskeletal: She exhibits no edema.       Left ankle: She exhibits swelling. She exhibits normal range of motion, no deformity, no laceration and normal pulse. No lateral malleolus, no medial malleolus, no head of 5th metatarsal and no proximal fibula tenderness found. Achilles tendon exhibits pain. Achilles tendon exhibits no defect and normal Thompson's test results.       Feet:  Neurological: She is alert. She has normal strength. No sensory deficit.  Skin: Skin is warm, dry and intact. No abrasion, no ecchymosis, no laceration and no rash noted. No pallor.    ED Course  Procedures (including critical care time)  Labs Reviewed - No data to display No results found.   1. Achilles tendonitis       MDM  Atraumatic.  Focal pain, consistent with tendonitis.  No warmth, rednesss, no rash, intact sensation, not weak, except  slightly limited due to pain.  RICE at home, analgesic prescription.          Gavin Pound. Cashmere Harmes, MD 07/25/11 1032

## 2011-07-25 NOTE — ED Notes (Signed)
Pt reports she awakened with ankle/heel pain this am.  Denies injury.

## 2012-01-25 ENCOUNTER — Emergency Department (HOSPITAL_BASED_OUTPATIENT_CLINIC_OR_DEPARTMENT_OTHER)
Admission: EM | Admit: 2012-01-25 | Discharge: 2012-01-25 | Discharge status: Against Medical Advice | Payer: Managed Care, Other (non HMO) | Attending: Emergency Medicine | Admitting: Emergency Medicine

## 2012-01-25 ENCOUNTER — Encounter (HOSPITAL_BASED_OUTPATIENT_CLINIC_OR_DEPARTMENT_OTHER): Payer: Self-pay | Admitting: *Deleted

## 2012-01-25 DIAGNOSIS — K089 Disorder of teeth and supporting structures, unspecified: Secondary | ICD-10-CM | POA: Insufficient documentation

## 2012-01-25 NOTE — ED Notes (Signed)
C/o right upper tooth pain that started one week ago. Denies fevers. Taking ibuprofen without relief. Does  Not currently have a dentist.

## 2012-07-07 ENCOUNTER — Emergency Department (HOSPITAL_BASED_OUTPATIENT_CLINIC_OR_DEPARTMENT_OTHER): Payer: Managed Care, Other (non HMO)

## 2012-07-07 ENCOUNTER — Emergency Department (HOSPITAL_BASED_OUTPATIENT_CLINIC_OR_DEPARTMENT_OTHER)
Admission: EM | Admit: 2012-07-07 | Discharge: 2012-07-07 | Disposition: A | Discharge status: Home / Self Care | Payer: Managed Care, Other (non HMO) | Attending: Emergency Medicine | Admitting: Emergency Medicine

## 2012-07-07 ENCOUNTER — Encounter (HOSPITAL_BASED_OUTPATIENT_CLINIC_OR_DEPARTMENT_OTHER): Payer: Self-pay | Admitting: Family Medicine

## 2012-07-07 DIAGNOSIS — S61219A Laceration without foreign body of unspecified finger without damage to nail, initial encounter: Secondary | ICD-10-CM

## 2012-07-07 DIAGNOSIS — S62639A Displaced fracture of distal phalanx of unspecified finger, initial encounter for closed fracture: Secondary | ICD-10-CM | POA: Insufficient documentation

## 2012-07-07 DIAGNOSIS — S6710XA Crushing injury of unspecified finger(s), initial encounter: Secondary | ICD-10-CM | POA: Insufficient documentation

## 2012-07-07 DIAGNOSIS — E119 Type 2 diabetes mellitus without complications: Secondary | ICD-10-CM | POA: Insufficient documentation

## 2012-07-07 DIAGNOSIS — I10 Essential (primary) hypertension: Secondary | ICD-10-CM | POA: Insufficient documentation

## 2012-07-07 DIAGNOSIS — E7800 Pure hypercholesterolemia, unspecified: Secondary | ICD-10-CM | POA: Insufficient documentation

## 2012-07-07 DIAGNOSIS — Y9289 Other specified places as the place of occurrence of the external cause: Secondary | ICD-10-CM | POA: Insufficient documentation

## 2012-07-07 DIAGNOSIS — Y9389 Activity, other specified: Secondary | ICD-10-CM | POA: Insufficient documentation

## 2012-07-07 DIAGNOSIS — W230XXA Caught, crushed, jammed, or pinched between moving objects, initial encounter: Secondary | ICD-10-CM | POA: Insufficient documentation

## 2012-07-07 DIAGNOSIS — Z794 Long term (current) use of insulin: Secondary | ICD-10-CM | POA: Insufficient documentation

## 2012-07-07 DIAGNOSIS — Z79899 Other long term (current) drug therapy: Secondary | ICD-10-CM | POA: Insufficient documentation

## 2012-07-07 DIAGNOSIS — S61209A Unspecified open wound of unspecified finger without damage to nail, initial encounter: Secondary | ICD-10-CM | POA: Insufficient documentation

## 2012-07-07 DIAGNOSIS — Z23 Encounter for immunization: Secondary | ICD-10-CM | POA: Insufficient documentation

## 2012-07-07 MED ORDER — GELATIN ABSORBABLE 12-7 MM EX MISC
1.0000 | Freq: Once | CUTANEOUS | Status: AC
  Administered 2012-07-07: 1 via TOPICAL
  Filled 2012-07-07: qty 1

## 2012-07-07 MED ORDER — CEPHALEXIN 500 MG PO CAPS: 500.0000 mg | ORAL_CAPSULE | Freq: Four times a day (QID) | ORAL | Status: DC

## 2012-07-07 MED ORDER — TETANUS-DIPHTH-ACELL PERTUSSIS 5-2.5-18.5 LF-MCG/0.5 IM SUSP
0.5000 mL | Freq: Once | INTRAMUSCULAR | Status: AC
  Administered 2012-07-07: 0.5 mL via INTRAMUSCULAR
  Filled 2012-07-07: qty 0.5

## 2012-07-07 MED ORDER — HYDROCODONE-ACETAMINOPHEN 5-325 MG PO TABS: 2.0000 | ORAL_TABLET | ORAL | Status: DC | PRN

## 2012-07-07 NOTE — ED Provider Notes (Signed)
History    CSN: [id number] Arrival date & time 07/07/12  1343  First MD Initiated Contact with Patient 07/07/12 1516     Chief Complaint  Patient presents with  . Laceration   (Consider location/radiation/quality/duration/timing/severity/associated sxs/prior Treatment) Patient is a 62 y.o. female presenting with skin laceration. The history is provided by the patient. No language interpreter was used.  Laceration Location:  Finger Finger laceration location:  R thumb Length (cm):  0.4 Quality: straight   Bleeding: venous and controlled   Pain details:    Quality:  Aching   Severity:  Moderate   Timing:  Constant Relieved by:  Nothing Pt reports she crushed her finger in a car door Past Medical History  Diagnosis Date  . Hypertension   . Diabetes mellitus   . High cholesterol    Past Surgical History  Procedure Laterality Date  . Back surgery    . Abdominal hysterectomy    . Appendectomy     No family history on file. History  Substance Use Topics  . Smoking status: Never Smoker   . Smokeless tobacco: Not on file  . Alcohol Use: No   OB History   Grav Para Term Preterm Abortions TAB SAB Ect Mult Living                 Review of Systems  All other systems reviewed and are negative.    Allergies  Codeine  Home Medications   Current Outpatient Rx  Name  Route  Sig  Dispense  Refill  . amLODipine-benazepril (LOTREL) 10-20 MG per capsule   Oral   Take 1 capsule by mouth daily.           Marland Kitchen aspirin 81 MG tablet   Oral   Take 81 mg by mouth daily.         Marland Kitchen escitalopram (LEXAPRO) 10 MG tablet   Oral   Take 10 mg by mouth daily.           . insulin glargine (LANTUS) 100 UNIT/ML injection   Subcutaneous   Inject 32 Units into the skin at bedtime.          . naproxen (NAPROSYN) 500 MG tablet   Oral   Take 1 tablet (500 mg total) by mouth 2 (two) times daily. Take with food to help prevent upset stomach. Discontinue if severe abdominal pain  occurs.   20 tablet   0   . pioglitazone-metformin (ACTOPLUS MET) 15-500 MG per tablet   Oral   Take 1 tablet by mouth 2 (two) times daily with a meal.         . simvastatin (ZOCOR) 10 MG tablet   Oral   Take 10 mg by mouth at bedtime.            BP 127/67  Pulse 84  Temp(Src) 98.6 F (37 C) (Oral)  SpO2 98% Physical Exam  Constitutional: She is oriented to person, place, and time. She appears well-developed and well-nourished.  Musculoskeletal: She exhibits tenderness.  5mm laceration lateral aspect of thumb,  No current bleeding,  nv and ns intact  Neurological: She is alert and oriented to person, place, and time. She has normal reflexes.  Skin: Skin is warm.  Psychiatric: She has a normal mood and affect.    ED Course  Procedures (including critical care time) Labs Reviewed - No data to display Dg Finger Thumb Right  07/07/2012   *RADIOLOGY REPORT*  Clinical Data: Thumb injury with  laceration and bleeding.  RIGHT THUMB 2+V  Comparison: None.  Findings: There is cortical irregularity and an obliquely oriented lucency in the distal phalangeal tuft, seen best on the lateral view.  Probable mild degenerative change along the base of the distal phalanx.  Soft tissue swelling without radiopaque foreign body.  IMPRESSION: Strongly suspect a nondisplaced distal phalangeal tuft fracture.   Original Report Authenticated By: Leanna Battles, M.D.   1. Laceration of finger, complicated, initial encounter   2. Fracture of distal phalanx of finger, closed, initial encounter     MDM  Gelfoam to area,  Splint.   Wound recheck in 2 days.   Keflex 500mg  28 qid and hydrocodone  Lonia Skinner [place], [place] 07/07/12 1533

## 2012-07-07 NOTE — ED Notes (Signed)
Pt c/o slamming right thumb in truck door and can't get bleeding to stop to right thumb. Bleeding controlled at present with dressing.

## 2012-07-07 NOTE — ED Provider Notes (Signed)
Medical screening examination/treatment/procedure(s) were performed by non-physician practitioner and as supervising physician I was immediately available for consultation/collaboration.   Charles B. Sheldon, MD 07/07/12 1539 

## 2012-08-30 ENCOUNTER — Encounter: Payer: Self-pay | Admitting: Family Medicine

## 2012-08-30 ENCOUNTER — Ambulatory Visit (INDEPENDENT_AMBULATORY_CARE_PROVIDER_SITE_OTHER): Payer: Managed Care, Other (non HMO) | Admitting: Family Medicine

## 2012-08-30 VITALS — BP 104/68 | HR 84 | Ht 66.0 in | Wt 180.0 lb

## 2012-08-30 DIAGNOSIS — S62639A Displaced fracture of distal phalanx of unspecified finger, initial encounter for closed fracture: Secondary | ICD-10-CM

## 2012-08-30 DIAGNOSIS — S62521A Displaced fracture of distal phalanx of right thumb, initial encounter for closed fracture: Secondary | ICD-10-CM

## 2012-08-30 NOTE — Patient Instructions (Addendum)
You suffered a tuft fracture of the tip of your thumb. I would recommend continued conservative treatment for this until you're about 3 months out (around September 26th). Repeating X-rays and/or occupational therapy are options - call me if you want to go ahead with either of these. Icing, anti-inflammatories as needed. I would not splint this at this point. Follow up with me around 9/26 if you're still having trouble. The numbness can take months to resolve.  (numbness and swelling tend to be the last things to resolve from this type of injury)

## 2012-08-31 ENCOUNTER — Encounter: Payer: Self-pay | Admitting: Family Medicine

## 2012-08-31 DIAGNOSIS — S62521A Displaced fracture of distal phalanx of right thumb, initial encounter for closed fracture: Secondary | ICD-10-CM | POA: Insufficient documentation

## 2012-08-31 NOTE — Progress Notes (Signed)
Patient ID: Kristin Anthony, female   DOB: 06/06/1950, 62 y.o.   MRN: [id number]  PCP: Creola Corn MD  Subjective:   HPI: Patient is a 62 y.o. female here for right thumb injury.  Patient reports she accidentally shut right thumb in a truck door on 6/25. + swelling, bruising, throbbing at the time. Went to ED and had x-rays showing a distal phalanx tuft fracture - given splint which she wore for about 10 days. Given tetanus shot and antibiotics which she finished. Took pain medicine initially. Still gets throbbing at tip of thumb especially at end of day. Tip of thumb is also still numb. Is right handed.  Past Medical History  Diagnosis Date  . Hypertension   . Diabetes mellitus   . High cholesterol     Current Outpatient Prescriptions on File Prior to Visit  Medication Sig Dispense Refill  . amLODipine-benazepril (LOTREL) 10-20 MG per capsule Take 1 capsule by mouth daily.        . insulin glargine (LANTUS) 100 UNIT/ML injection Inject 32 Units into the skin at bedtime.       . pioglitazone-metformin (ACTOPLUS MET) 15-500 MG per tablet Take 1 tablet by mouth 2 (two) times daily with a meal.      . simvastatin (ZOCOR) 10 MG tablet Take 10 mg by mouth at bedtime.        Marland Kitchen aspirin 81 MG tablet Take 81 mg by mouth daily.      Marland Kitchen escitalopram (LEXAPRO) 10 MG tablet Take 10 mg by mouth daily.         No current facility-administered medications on file prior to visit.    Past Surgical History  Procedure Laterality Date  . Back surgery    . Abdominal hysterectomy    . Appendectomy      Allergies  Allergen Reactions  . Codeine Nausea And Vomiting    History   Social History  . Marital Status: Married    Spouse Name: N/A    Number of Children: N/A  . Years of Education: N/A   Occupational History  . Not on file.   Social History Main Topics  . Smoking status: Never Smoker   . Smokeless tobacco: Not on file  . Alcohol Use: No  . Drug Use: No  . Sexual Activity:  Not on file   Other Topics Concern  . Not on file   Social History Narrative  . No narrative on file    Family History  Problem Relation Age of Onset  . Heart attack Mother   . Diabetes Father   . Hypertension Father   . Hypertension Sister   . Diabetes Sister   . Hypertension Brother   . Diabetes Brother   . Hyperlipidemia Neg Hx   . Sudden death Neg Hx     BP 104/68  Pulse 84  Ht 5\' 6"  (1.676 m)  Wt 180 lb (81.647 kg)  BMI 29.07 kg/m2  Review of Systems: See HPI above.    Objective:  Physical Exam:  Gen: NAD  R thumb: Mild swelling over pad.  No bruising, other deformity.  No malrotation or angulation. TTP mildly distal phalanx.  No other TTP thumb. Full flexion, extension at IP and MCP joints and against resistance without pain. Collateral ligaments intact. NVI distally.    Assessment & Plan:  1. Right thumb distal phalanx tuft fracture - advised patient typically these take about 6 weeks to heal and she is taking a little longer  than expected.  However she doesn't have any malrotation or angulation.  No evidence of infection.  Exam is overall reassuring.  Discussed could repeat imaging at this point but I would recommend monitoring until she is about 3 months out given she has no red flags.  Numbness may take months to resolve.  She would like to wait and follow-up at 3 months if she's still having problems.

## 2012-08-31 NOTE — Assessment & Plan Note (Signed)
Right thumb distal phalanx tuft fracture - advised patient typically these take about 6 weeks to heal and she is taking a little longer than expected.  However she doesn't have any malrotation or angulation.  No evidence of infection.  Exam is overall reassuring.  Discussed could repeat imaging at this point but I would recommend monitoring until she is about 3 months out given she has no red flags.  Numbness may take months to resolve.  She would like to wait and follow-up at 3 months if she's still having problems.

## 2012-11-01 ENCOUNTER — Emergency Department (HOSPITAL_BASED_OUTPATIENT_CLINIC_OR_DEPARTMENT_OTHER)
Admission: EM | Admit: 2012-11-01 | Discharge: 2012-11-01 | Disposition: A | Discharge status: Home / Self Care | Payer: Managed Care, Other (non HMO) | Attending: Emergency Medicine | Admitting: Emergency Medicine

## 2012-11-01 ENCOUNTER — Encounter (HOSPITAL_BASED_OUTPATIENT_CLINIC_OR_DEPARTMENT_OTHER): Payer: Self-pay | Admitting: Emergency Medicine

## 2012-11-01 DIAGNOSIS — E7800 Pure hypercholesterolemia, unspecified: Secondary | ICD-10-CM | POA: Insufficient documentation

## 2012-11-01 DIAGNOSIS — M62838 Other muscle spasm: Secondary | ICD-10-CM | POA: Insufficient documentation

## 2012-11-01 DIAGNOSIS — Z79899 Other long term (current) drug therapy: Secondary | ICD-10-CM | POA: Insufficient documentation

## 2012-11-01 DIAGNOSIS — E119 Type 2 diabetes mellitus without complications: Secondary | ICD-10-CM | POA: Insufficient documentation

## 2012-11-01 DIAGNOSIS — Z7982 Long term (current) use of aspirin: Secondary | ICD-10-CM | POA: Insufficient documentation

## 2012-11-01 DIAGNOSIS — M542 Cervicalgia: Secondary | ICD-10-CM | POA: Insufficient documentation

## 2012-11-01 DIAGNOSIS — Z794 Long term (current) use of insulin: Secondary | ICD-10-CM | POA: Insufficient documentation

## 2012-11-01 DIAGNOSIS — I10 Essential (primary) hypertension: Secondary | ICD-10-CM | POA: Insufficient documentation

## 2012-11-01 MED ORDER — NAPROXEN 500 MG PO TABS: 500.0000 mg | ORAL_TABLET | Freq: Two times a day (BID) | ORAL | Status: DC

## 2012-11-01 MED ORDER — CYCLOBENZAPRINE HCL 10 MG PO TABS: 10.0000 mg | ORAL_TABLET | Freq: Three times a day (TID) | ORAL | Status: DC | PRN

## 2012-11-01 NOTE — ED Notes (Signed)
Pt reports neck stiffness x 3 weeks.  Denies injury.  Reports pain is worse at night while lying down.

## 2012-11-01 NOTE — ED Notes (Signed)
MD at bedside. 

## 2012-11-01 NOTE — ED Provider Notes (Signed)
CSN: [id number]     Arrival date & time 11/01/12  0730 History   First MD Initiated Contact with Patient 11/01/12 [phone]     Chief Complaint  Patient presents with  . Torticollis   (Consider location/radiation/quality/duration/timing/severity/associated sxs/prior Treatment) HPI Pt reports 3 weeks of moderate to severe aching pain in R neck, worse with movement and worse at night. Denies any injury or trauma. No numbness tingling. Has tried motrin without improvement. Has not been to see PCP for same.  Past Medical History  Diagnosis Date  . Hypertension   . Diabetes mellitus   . High cholesterol    Past Surgical History  Procedure Laterality Date  . Back surgery    . Abdominal hysterectomy    . Appendectomy     Family History  Problem Relation Age of Onset  . Heart attack Mother   . Diabetes Father   . Hypertension Father   . Hypertension Sister   . Diabetes Sister   . Hypertension Brother   . Diabetes Brother   . Hyperlipidemia Neg Hx   . Sudden death Neg Hx    History  Substance Use Topics  . Smoking status: Never Smoker   . Smokeless tobacco: Not on file  . Alcohol Use: No   OB History   Grav Para Term Preterm Abortions TAB SAB Ect Mult Living                 Review of Systems All other systems reviewed and are negative except as noted in HPI.   Allergies  Codeine  Home Medications   Current Outpatient Rx  Name  Route  Sig  Dispense  Refill  . SitaGLIPtin Phosphate (JANUVIA PO)   Oral   Take by mouth.         Marland Kitchen amLODipine-benazepril (LOTREL) 10-20 MG per capsule   Oral   Take 1 capsule by mouth daily.           Marland Kitchen aspirin 81 MG tablet   Oral   Take 81 mg by mouth daily.         . B-D INS SYRINGE 0.5CC/31GX5/16 31G X 5/16" 0.5 ML MISC               . escitalopram (LEXAPRO) 10 MG tablet   Oral   Take 10 mg by mouth daily.           . insulin glargine (LANTUS) 100 UNIT/ML injection   Subcutaneous   Inject 32 Units into the skin at  bedtime.          . simvastatin (ZOCOR) 10 MG tablet   Oral   Take 10 mg by mouth at bedtime.            There were no vitals taken for this visit. Physical Exam  Nursing note and vitals reviewed. Constitutional: She is oriented to person, place, and time. She appears well-developed and well-nourished.  HENT:  Head: Normocephalic and atraumatic.  Eyes: EOM are normal. Pupils are equal, round, and reactive to light.  Neck: Normal range of motion. Neck supple.  Cardiovascular: Normal rate, normal heart sounds and intact distal pulses.   Pulmonary/Chest: Effort normal and breath sounds normal.  Abdominal: Bowel sounds are normal. She exhibits no distension. There is no tenderness.  Musculoskeletal: Normal range of motion. She exhibits tenderness (R lateral cervical paraspinal muscle tenderness). She exhibits no edema.  Neurological: She is alert and oriented to person, place, and time. She has normal strength. She  displays normal reflexes. No cranial nerve deficit or sensory deficit.  Skin: Skin is warm and dry. No rash noted.  Psychiatric: She has a normal mood and affect.    ED Course  Procedures (including critical care time) Labs Review Labs Reviewed - No data to display Imaging Review No results found.  EKG Interpretation   None       MDM   1. Muscle spasms of neck     Muscle spasm in neck ongoing for 3 weeks. Will Rx NSAID, muscle relaxer, advised warm compress/heating pad, massage and PCP follow up if not improving.     Charles B. Bernette Mayers, MD 11/01/12 [phone]

## 2013-07-11 ENCOUNTER — Other Ambulatory Visit: Payer: Self-pay | Admitting: Internal Medicine

## 2013-07-11 DIAGNOSIS — R1011 Right upper quadrant pain: Secondary | ICD-10-CM

## 2013-07-18 ENCOUNTER — Ambulatory Visit
Admission: RE | Admit: 2013-07-18 | Discharge: 2013-07-18 | Disposition: A | Discharge status: Home / Self Care | Payer: Managed Care, Other (non HMO) | Source: Ambulatory Visit | Attending: Internal Medicine | Admitting: Internal Medicine

## 2013-07-18 DIAGNOSIS — R1011 Right upper quadrant pain: Secondary | ICD-10-CM

## 2013-07-29 ENCOUNTER — Other Ambulatory Visit: Payer: Self-pay | Admitting: Neurological Surgery

## 2013-07-29 DIAGNOSIS — IMO0002 Reserved for concepts with insufficient information to code with codable children: Secondary | ICD-10-CM

## 2013-08-03 ENCOUNTER — Emergency Department (HOSPITAL_BASED_OUTPATIENT_CLINIC_OR_DEPARTMENT_OTHER)
Admission: EM | Admit: 2013-08-03 | Discharge: 2013-08-03 | Disposition: A | Discharge status: Home / Self Care | Payer: Managed Care, Other (non HMO) | Attending: Emergency Medicine | Admitting: Emergency Medicine

## 2013-08-03 ENCOUNTER — Ambulatory Visit (INDEPENDENT_AMBULATORY_CARE_PROVIDER_SITE_OTHER): Payer: Managed Care, Other (non HMO)

## 2013-08-03 ENCOUNTER — Encounter (HOSPITAL_BASED_OUTPATIENT_CLINIC_OR_DEPARTMENT_OTHER): Payer: Self-pay | Admitting: Emergency Medicine

## 2013-08-03 DIAGNOSIS — E7800 Pure hypercholesterolemia, unspecified: Secondary | ICD-10-CM | POA: Insufficient documentation

## 2013-08-03 DIAGNOSIS — E119 Type 2 diabetes mellitus without complications: Secondary | ICD-10-CM | POA: Insufficient documentation

## 2013-08-03 DIAGNOSIS — R197 Diarrhea, unspecified: Secondary | ICD-10-CM | POA: Insufficient documentation

## 2013-08-03 DIAGNOSIS — Z7982 Long term (current) use of aspirin: Secondary | ICD-10-CM | POA: Insufficient documentation

## 2013-08-03 DIAGNOSIS — I10 Essential (primary) hypertension: Secondary | ICD-10-CM | POA: Insufficient documentation

## 2013-08-03 DIAGNOSIS — Z90710 Acquired absence of both cervix and uterus: Secondary | ICD-10-CM | POA: Insufficient documentation

## 2013-08-03 DIAGNOSIS — M5126 Other intervertebral disc displacement, lumbar region: Secondary | ICD-10-CM

## 2013-08-03 DIAGNOSIS — A084 Viral intestinal infection, unspecified: Secondary | ICD-10-CM

## 2013-08-03 DIAGNOSIS — Z9089 Acquired absence of other organs: Secondary | ICD-10-CM | POA: Insufficient documentation

## 2013-08-03 DIAGNOSIS — Z791 Long term (current) use of non-steroidal anti-inflammatories (NSAID): Secondary | ICD-10-CM | POA: Insufficient documentation

## 2013-08-03 DIAGNOSIS — IMO0002 Reserved for concepts with insufficient information to code with codable children: Secondary | ICD-10-CM

## 2013-08-03 DIAGNOSIS — Z794 Long term (current) use of insulin: Secondary | ICD-10-CM | POA: Insufficient documentation

## 2013-08-03 DIAGNOSIS — A088 Other specified intestinal infections: Secondary | ICD-10-CM | POA: Insufficient documentation

## 2013-08-03 DIAGNOSIS — R110 Nausea: Secondary | ICD-10-CM | POA: Insufficient documentation

## 2013-08-03 DIAGNOSIS — Z79899 Other long term (current) drug therapy: Secondary | ICD-10-CM | POA: Insufficient documentation

## 2013-08-03 LAB — CBC WITH DIFFERENTIAL/PLATELET
BASOS PCT: 0 % (ref 0–1)
Basophils Absolute: 0 10*3/uL (ref 0.0–0.1)
Eosinophils Absolute: 0.3 10*3/uL (ref 0.0–0.7)
Eosinophils Relative: 3 % (ref 0–5)
HEMATOCRIT: 38 % (ref 36.0–46.0)
Hemoglobin: 12.8 g/dL (ref 12.0–15.0)
LYMPHS PCT: 39 % (ref 12–46)
Lymphs Abs: 3.3 10*3/uL (ref 0.7–4.0)
MCH: 30.2 pg (ref 26.0–34.0)
MCHC: 33.7 g/dL (ref 30.0–36.0)
MCV: 89.6 fL (ref 78.0–100.0)
MONOS PCT: 7 % (ref 3–12)
Monocytes Absolute: 0.6 10*3/uL (ref 0.1–1.0)
Neutro Abs: 4.2 10*3/uL (ref 1.7–7.7)
Neutrophils Relative %: 50 % (ref 43–77)
Platelets: 173 10*3/uL (ref 150–400)
RBC: 4.24 MIL/uL (ref 3.87–5.11)
RDW: 13.1 % (ref 11.5–15.5)
WBC: 8.3 10*3/uL (ref 4.0–10.5)

## 2013-08-03 LAB — URINALYSIS, ROUTINE W REFLEX MICROSCOPIC
Bilirubin Urine: NEGATIVE
Glucose, UA: NEGATIVE mg/dL
Hgb urine dipstick: NEGATIVE
KETONES UR: NEGATIVE mg/dL
LEUKOCYTES UA: NEGATIVE
NITRITE: NEGATIVE
PROTEIN: NEGATIVE mg/dL
Specific Gravity, Urine: 1.012 (ref 1.005–1.030)
Urobilinogen, UA: 1 mg/dL (ref 0.0–1.0)
pH: 6 (ref 5.0–8.0)

## 2013-08-03 LAB — COMPREHENSIVE METABOLIC PANEL
ALT: 18 U/L (ref 0–35)
ANION GAP: 11 (ref 5–15)
AST: 19 U/L (ref 0–37)
Albumin: 3.5 g/dL (ref 3.5–5.2)
Alkaline Phosphatase: 80 U/L (ref 39–117)
BILIRUBIN TOTAL: 0.3 mg/dL (ref 0.3–1.2)
BUN: 10 mg/dL (ref 6–23)
CHLORIDE: 104 meq/L (ref 96–112)
CO2: 24 meq/L (ref 19–32)
CREATININE: 0.6 mg/dL (ref 0.50–1.10)
Calcium: 9.2 mg/dL (ref 8.4–10.5)
GFR calc Af Amer: >90 mL/min (ref >90)
Glucose, Bld: 160 mg/dL — ABNORMAL HIGH (ref 70–99)
Potassium: 4.5 mEq/L (ref 3.7–5.3)
Sodium: 139 mEq/L (ref 137–147)
Total Protein: 7.3 g/dL (ref 6.0–8.3)

## 2013-08-03 LAB — LIPASE, BLOOD: Lipase: 30 U/L (ref 11–59)

## 2013-08-03 MED ORDER — ONDANSETRON 4 MG PO TBDP: 4.0000 mg | ORAL_TABLET | Freq: Three times a day (TID) | ORAL | Status: DC | PRN

## 2013-08-03 MED ORDER — SODIUM CHLORIDE 0.9 % IV BOLUS (SEPSIS)
1000.0000 mL | Freq: Once | INTRAVENOUS | Status: AC
  Administered 2013-08-03: 1000 mL via INTRAVENOUS

## 2013-08-03 MED ORDER — MORPHINE SULFATE 4 MG/ML IJ SOLN
2.0000 mg | Freq: Once | INTRAMUSCULAR | Status: AC
  Administered 2013-08-03: 2 mg via INTRAVENOUS
  Filled 2013-08-03: qty 1

## 2013-08-03 MED ORDER — TRAMADOL HCL 50 MG PO TABS: 50.0000 mg | ORAL_TABLET | Freq: Four times a day (QID) | ORAL | Status: DC | PRN

## 2013-08-03 MED ORDER — ONDANSETRON HCL 4 MG/2ML IJ SOLN
4.0000 mg | Freq: Once | INTRAMUSCULAR | Status: AC
  Administered 2013-08-03: 4 mg via INTRAVENOUS
  Filled 2013-08-03: qty 2

## 2013-08-03 MED ORDER — MORPHINE SULFATE 4 MG/ML IJ SOLN
2.0000 mg | Freq: Once | INTRAMUSCULAR | Status: AC
Start: 2013-08-03 — End: 2013-08-03
  Administered 2013-08-03: 2 mg via INTRAVENOUS
  Filled 2013-08-03: qty 1

## 2013-08-03 NOTE — Discharge Instructions (Signed)
Take zofran as needed for nausea. Take Tramadol as needed for pain. Refer to attached documents for more information.

## 2013-08-03 NOTE — ED Notes (Signed)
Pt c/o abd pain with n/v/d/ x 34 days

## 2013-08-03 NOTE — ED Provider Notes (Signed)
CSN: [id number]     Arrival date & time 08/03/13  1810 History   First MD Initiated Contact with Patient 08/03/13 1821     Chief Complaint  Patient presents with  . Abdominal Pain     (Consider location/radiation/quality/duration/timing/severity/associated sxs/prior Treatment) HPI Comments: Patient is a 63 year old female with a past medical history of hypertension and diabetes who presents with abdominal pain for the past 4 days. The pain is located in her upper abdomen and does not radiate. The pain is described as cramping and moderate. The pain started gradually and progressively worsened since the onset. No alleviating/aggravating factors. The patient has tried nothing for symptoms without relief. Associated symptoms include nausea and diarrhea. Patient denies fever, headache, vomiting, chest pain, SOB, dysuria, constipation. No known sick contacts.    Past Medical History  Diagnosis Date  . Hypertension   . Diabetes mellitus   . High cholesterol    Past Surgical History  Procedure Laterality Date  . Back surgery    . Abdominal hysterectomy    . Appendectomy     Family History  Problem Relation Age of Onset  . Heart attack Mother   . Diabetes Father   . Hypertension Father   . Hypertension Sister   . Diabetes Sister   . Hypertension Brother   . Diabetes Brother   . Hyperlipidemia Neg Hx   . Sudden death Neg Hx    History  Substance Use Topics  . Smoking status: Never Smoker   . Smokeless tobacco: Not on file  . Alcohol Use: No   OB History   Grav Para Term Preterm Abortions TAB SAB Ect Mult Living                 Review of Systems  Constitutional: Negative for fever, chills and fatigue.  HENT: Negative for trouble swallowing.   Eyes: Negative for visual disturbance.  Respiratory: Negative for shortness of breath.   Cardiovascular: Negative for chest pain and palpitations.  Gastrointestinal: Positive for nausea, abdominal pain and diarrhea.  Genitourinary:  Negative for dysuria and difficulty urinating.  Musculoskeletal: Negative for arthralgias and neck pain.  Skin: Negative for color change.  Neurological: Negative for dizziness and weakness.  Psychiatric/Behavioral: Negative for dysphoric mood.      Allergies  Codeine  Home Medications   Prior to Admission medications   Medication Sig Start Date End Date Taking? Authorizing Provider  amLODipine-benazepril (LOTREL) 10-20 MG per capsule Take 1 capsule by mouth daily.      Historical Provider, MD  aspirin 81 MG tablet Take 81 mg by mouth daily.    Historical Provider, MD  B-D INS SYRINGE 0.5CC/31GX5/16 31G X 5/16" 0.5 ML MISC  06/09/12   Historical Provider, MD  cyclobenzaprine (FLEXERIL) 10 MG tablet Take 1 tablet (10 mg total) by mouth 3 (three) times daily as needed for muscle spasms. 11/01/12   Charles B. Bernette Mayers, MD  escitalopram (LEXAPRO) 10 MG tablet Take 10 mg by mouth daily.      Historical Provider, MD  insulin glargine (LANTUS) 100 UNIT/ML injection Inject 32 Units into the skin at bedtime.     Historical Provider, MD  naproxen (NAPROSYN) 500 MG tablet Take 1 tablet (500 mg total) by mouth 2 (two) times daily. 11/01/12   Charles B. Bernette Mayers, MD  simvastatin (ZOCOR) 10 MG tablet Take 10 mg by mouth at bedtime.      Historical Provider, MD  SitaGLIPtin Phosphate (JANUVIA PO) Take by mouth.  Historical Provider, MD   BP 145/79  Pulse 87  Temp(Src) 98.3 F (36.8 C) (Oral)  Resp 16  Wt 180 lb (81.647 kg)  SpO2 100% Physical Exam  Nursing note and vitals reviewed. Constitutional: She is oriented to person, place, and time. She appears well-developed and well-nourished. No distress.  HENT:  Head: Normocephalic and atraumatic.  Eyes: Conjunctivae and EOM are normal.  Neck: Normal range of motion.  Cardiovascular: Normal rate and regular rhythm.  Exam reveals no gallop and no friction rub.   No murmur heard. Pulmonary/Chest: Effort normal and breath sounds normal. She has no  wheezes. She has no rales. She exhibits no tenderness.  Abdominal: Soft. She exhibits no distension. There is tenderness. There is no rebound and no guarding.  Generalized upper abdominal tenderness to palpation. No focal tenderness or peritoneal signs.   Musculoskeletal: Normal range of motion.  Neurological: She is alert and oriented to person, place, and time. Coordination normal.  Speech is goal-oriented. Moves limbs without ataxia.   Skin: Skin is warm and dry.  Psychiatric: She has a normal mood and affect. Her behavior is normal.    ED Course  Procedures (including critical care time) Labs Review Labs Reviewed  COMPREHENSIVE METABOLIC PANEL - Abnormal; Notable for the following:    Glucose, Bld 160 (*)    All other components within normal limits  CBC WITH DIFFERENTIAL  URINALYSIS, ROUTINE W REFLEX MICROSCOPIC  LIPASE, BLOOD    Imaging Review Mr Lumbar Spine Wo Contrast  08/03/2013   CLINICAL DATA:  Low back pain and right leg pain in weakness.  EXAM: MRI LUMBAR SPINE WITHOUT CONTRAST  TECHNIQUE: Multiplanar, multisequence MR imaging of the lumbar spine was performed. No intravenous contrast was administered.  COMPARISON:  04/14/2011  FINDINGS: T12-L1:  Normal.  L1-2: Shallow disc herniation more prominent towards the right with slight upward migration in the midline. No apparent neural compression.  L2-3:  Normal interspace.  L3-4: Mild circumferential bulging of the disc. Mild facet and ligamentous hypertrophy. No apparent compressive stenosis.  L4-5: Previous decompression and fusion. Fusion appears solid. Wide patency of the canal and foramina.  L5-S1: Endplate osteophytes and bulging of the disc. No compressive narrowing of the canal or foramina.  Since the previous study, the appearance is unchanged.  IMPRESSION: No change since 04/14/2011.  Shallow right posterior lateral disc herniation at L1-2 with slight upward migration in the midline. No apparent neural compression.  Disc  bulge and mild facet hypertrophy at L3-4 but without apparent neural compression.  Endplate osteophytes and disc bulge at L5-S1 without apparent neural compression.   Electronically Signed   By: Paulina FusiMark  Shogry M.D.   On: 08/03/2013 16:18     EKG Interpretation None      MDM   Final diagnoses:  Viral gastroenteritis    6:35 PM Labs and urinalysis pending. Vitals stable and patient afebrile. Patient will have morphine and zofran for symptoms.   8:18 PM Patient feeling better. Patient's labs and urinalysis unremarkable for acute changes. Vitals stable and patient afebrile. She likely has viral illness and will be discharged with zofran and tramadol.     Emilia BeckKaitlyn Osha Rane, PA-C 08/03/13 2020

## 2013-08-03 NOTE — ED Provider Notes (Signed)
Medical screening examination/treatment/procedure(s) were performed by non-physician practitioner and as supervising physician I was immediately available for consultation/collaboration.   EKG Interpretation None        Dagmar HaitWilliam Abundio Teuscher, MD 08/03/13 [phone]

## 2013-08-03 NOTE — ED Notes (Signed)
Pt ambulating independently w/ steady gait on d/c in no acute distress, A&Ox4. D/c instructions reviewed w/ pt and family - pt and family deny any further questions or concerns at present. Rx given x2  

## 2013-11-24 ENCOUNTER — Encounter (HOSPITAL_BASED_OUTPATIENT_CLINIC_OR_DEPARTMENT_OTHER): Payer: Self-pay | Admitting: *Deleted

## 2013-11-24 ENCOUNTER — Emergency Department (HOSPITAL_BASED_OUTPATIENT_CLINIC_OR_DEPARTMENT_OTHER)
Admission: EM | Admit: 2013-11-24 | Discharge: 2013-11-24 | Disposition: A | Discharge status: Home / Self Care | Payer: Managed Care, Other (non HMO) | Attending: Emergency Medicine | Admitting: Emergency Medicine

## 2013-11-24 DIAGNOSIS — E780 Pure hypercholesterolemia: Secondary | ICD-10-CM | POA: Insufficient documentation

## 2013-11-24 DIAGNOSIS — I10 Essential (primary) hypertension: Secondary | ICD-10-CM | POA: Insufficient documentation

## 2013-11-24 DIAGNOSIS — M436 Torticollis: Secondary | ICD-10-CM | POA: Insufficient documentation

## 2013-11-24 DIAGNOSIS — E119 Type 2 diabetes mellitus without complications: Secondary | ICD-10-CM | POA: Insufficient documentation

## 2013-11-24 DIAGNOSIS — Z794 Long term (current) use of insulin: Secondary | ICD-10-CM | POA: Insufficient documentation

## 2013-11-24 DIAGNOSIS — Z791 Long term (current) use of non-steroidal anti-inflammatories (NSAID): Secondary | ICD-10-CM | POA: Insufficient documentation

## 2013-11-24 DIAGNOSIS — Z79899 Other long term (current) drug therapy: Secondary | ICD-10-CM | POA: Insufficient documentation

## 2013-11-24 DIAGNOSIS — M542 Cervicalgia: Secondary | ICD-10-CM | POA: Diagnosis present

## 2013-11-24 DIAGNOSIS — Z7982 Long term (current) use of aspirin: Secondary | ICD-10-CM | POA: Diagnosis not present

## 2013-11-24 MED ORDER — CYCLOBENZAPRINE HCL 10 MG PO TABS: 10.0000 mg | ORAL_TABLET | Freq: Three times a day (TID) | ORAL | Status: DC | PRN

## 2013-11-24 MED ORDER — HYDROCODONE-ACETAMINOPHEN 5-325 MG PO TABS
1.0000 | ORAL_TABLET | ORAL | Status: AC
  Administered 2013-11-24: 1 via ORAL
  Filled 2013-11-24: qty 1

## 2013-11-24 MED ORDER — HYDROCODONE-ACETAMINOPHEN 5-325 MG PO TABS: 1.0000 | ORAL_TABLET | Freq: Four times a day (QID) | ORAL | Status: DC | PRN

## 2013-11-24 NOTE — Discharge Instructions (Signed)

## 2013-11-24 NOTE — ED Notes (Signed)
C/o rt side neck pain radiating into rt shoulder and arm x 4 days  Diff turning neck,  Increased pain w movement and walking

## 2013-11-24 NOTE — ED Provider Notes (Signed)
CSN: [id number]     Arrival date & time 11/24/13  1827 History   First MD Initiated Contact with Patient 11/24/13 2057     This chart was scribed for Linwood DibblesJon Viki Carrera, MD by Arlan OrganAshley Leger, ED Scribe. This patient was seen in room MH07/MH07 and the patient's care was started 9:30 PM.   Chief Complaint  Patient presents with  . Neck Pain   Patient is a 63 y.o. female presenting with neck pain. The history is provided by the patient. No language interpreter was used.  Neck Pain Pain location:  R side Pain radiates to:  R arm and R shoulder Pain severity:  Moderate Pain is:  Same all the time Duration:  4 days Timing:  Constant Progression:  Unchanged Chronicity:  Recurrent Worsened by:  Nothing tried Ineffective treatments:  NSAIDs Associated symptoms: no fever, no numbness and no weakness     HPI Comments: Kristin Anthony is a 63 y.o. female with a PMHx of HTN and DM who presents to the Emergency Department complaining of constant, moderate R sided neck pain that radiates into the R shoulder and into the R arm x 4 days that is unchanged. Pt states she is unable to fully rotate her neck from L to R. She denies any recent injury or trauma. [name] has tried OTC Ibuprofen without any improvement for symptoms. No numbness or weakness to extremities. She denies any fever, chills, or swelling. Pt with known allergies to Codeine. No other concerns this visit.  Past Medical History  Diagnosis Date  . Hypertension   . Diabetes mellitus   . High cholesterol    Past Surgical History  Procedure Laterality Date  . Back surgery    . Abdominal hysterectomy    . Appendectomy     Family History  Problem Relation Age of Onset  . Heart attack Mother   . Diabetes Father   . Hypertension Father   . Hypertension Sister   . Diabetes Sister   . Hypertension Brother   . Diabetes Brother   . Hyperlipidemia Neg Hx   . Sudden death Neg Hx    History  Substance Use Topics  . Smoking status: Never  Smoker   . Smokeless tobacco: Not on file  . Alcohol Use: No   OB History    No data available     Review of Systems  Constitutional: Negative for fever and chills.  Musculoskeletal: Positive for neck pain.  Neurological: Negative for weakness and numbness.  All other systems reviewed and are negative.     Allergies  Codeine  Home Medications   Prior to Admission medications   Medication Sig Start Date End Date Taking? Authorizing Provider  amLODipine-benazepril (LOTREL) 10-20 MG per capsule Take 1 capsule by mouth daily.      Historical Provider, MD  aspirin 81 MG tablet Take 81 mg by mouth daily.    Historical Provider, MD  B-D INS SYRINGE 0.5CC/31GX5/16 31G X 5/16" 0.5 ML MISC  06/09/12   Historical Provider, MD  cyclobenzaprine (FLEXERIL) 10 MG tablet Take 1 tablet (10 mg total) by mouth 3 (three) times daily as needed for muscle spasms. 11/24/13   Linwood DibblesJon Makaiya Geerdes, MD  escitalopram (LEXAPRO) 10 MG tablet Take 10 mg by mouth daily.      Historical Provider, MD  HYDROcodone-acetaminophen (NORCO/VICODIN) 5-325 MG per tablet Take 1-2 tablets by mouth every 6 (six) hours as needed for moderate pain. 11/24/13   Linwood DibblesJon Rebbie Lauricella, MD  insulin glargine (LANTUS)  100 UNIT/ML injection Inject 32 Units into the skin at bedtime.     Historical Provider, MD  naproxen (NAPROSYN) 500 MG tablet Take 1 tablet (500 mg total) by mouth 2 (two) times daily. 11/01/12   Charles B. Bernette MayersSheldon, MD  ondansetron (ZOFRAN ODT) 4 MG disintegrating tablet Take 1 tablet (4 mg total) by mouth every 8 (eight) hours as needed for nausea or vomiting. 08/03/13   Emilia BeckKaitlyn Szekalski, PA-C  simvastatin (ZOCOR) 10 MG tablet Take 10 mg by mouth at bedtime.      Historical Provider, MD  SitaGLIPtin Phosphate (JANUVIA PO) Take by mouth.    Historical Provider, MD  traMADol (ULTRAM) 50 MG tablet Take 1 tablet (50 mg total) by mouth every 6 (six) hours as needed. 08/03/13   Emilia BeckKaitlyn Szekalski, PA-C   Triage Vitals: BP 140/74 mmHg  Pulse 90   Temp(Src) 98.3 F (36.8 C) (Oral)  Resp 16  Ht 5\' 6"  (1.676 m)  Wt 180 lb (81.647 kg)  BMI 29.07 kg/m2  SpO2 96%   Physical Exam  Constitutional: She appears well-developed and well-nourished. No distress.  HENT:  Head: Normocephalic and atraumatic.  Right Ear: External ear normal.  Left Ear: External ear normal.  Eyes: Conjunctivae are normal. Right eye exhibits no discharge. Left eye exhibits no discharge. No scleral icterus.  Neck: Neck supple. Muscular tenderness present. No rigidity. Decreased range of motion present. No tracheal deviation present. No thyroid mass present.    Cardiovascular: Normal rate.   Pulmonary/Chest: Effort normal. No stridor. No respiratory distress.  Musculoskeletal: She exhibits no edema.  Neurological: She is alert. Cranial nerve deficit: no gross deficits.  Skin: Skin is warm and dry. No rash noted.  Psychiatric: She has a normal mood and affect.  Nursing note and vitals reviewed.   ED Course  Procedures (including critical care time)  DIAGNOSTIC STUDIES: Oxygen Saturation is 96% on RA, adequate by my interpretation.    COORDINATION OF CARE: 9:30 PM- Will give Norco here in ED. Discussed treatment plan with pt at bedside and pt agreed to plan.     Labs Review Labs Reviewed - No data to display  Imaging Review No results found.   EKG Interpretation   Date/Time:  Thursday November 24 2013 20:35:54 EST Ventricular Rate:  85 PR Interval:  158 QRS Duration: 94 QT Interval:  420 QTC Calculation: 499 R Axis:   42 Text Interpretation:  Normal sinus rhythm Possible Anterior infarct , age  undetermined Abnormal ECG nonspecific t wave changes leads I and II since  last tracing Confirmed by Joevanni Roddey  MD-J, Myana Schlup ([id number]) on 11/24/2013 8:49:17  PM      MDM   Final diagnoses:  Torticollis, acute   Consistent with muscle spasm, torticollis.  Nl neuro exam.  No fever.  No signs of infection  At this time there does not appear to be any  evidence of an acute emergency medical condition and the patient appears stable for discharge with appropriate outpatient follow up.   I personally performed the services described in this documentation, which was scribed in my presence. The recorded information has been reviewed and is accurate.    Linwood DibblesJon Lamika Connolly, MD 11/24/13 2131

## 2013-11-24 NOTE — ED Notes (Signed)
Pt to desk to ask about length of wait- delay explained- ambulatory with steady gait

## 2013-11-24 NOTE — ED Notes (Signed)
Pt c/o neck pain which radiates to right shoulder  Denies injury

## 2014-11-08 ENCOUNTER — Other Ambulatory Visit (HOSPITAL_COMMUNITY): Payer: Self-pay | Admitting: Internal Medicine

## 2014-11-08 ENCOUNTER — Ambulatory Visit (HOSPITAL_COMMUNITY)
Admission: RE | Admit: 2014-11-08 | Discharge: 2014-11-08 | Disposition: A | Discharge status: Home / Self Care | Payer: Managed Care, Other (non HMO) | Source: Ambulatory Visit | Attending: Vascular Surgery | Admitting: Vascular Surgery

## 2014-11-08 DIAGNOSIS — M79605 Pain in left leg: Secondary | ICD-10-CM | POA: Insufficient documentation

## 2014-11-08 DIAGNOSIS — R600 Localized edema: Secondary | ICD-10-CM

## 2014-11-20 ENCOUNTER — Encounter (HOSPITAL_BASED_OUTPATIENT_CLINIC_OR_DEPARTMENT_OTHER): Payer: Self-pay | Admitting: Emergency Medicine

## 2014-11-20 ENCOUNTER — Emergency Department (HOSPITAL_BASED_OUTPATIENT_CLINIC_OR_DEPARTMENT_OTHER)
Admission: EM | Admit: 2014-11-20 | Discharge: 2014-11-20 | Disposition: A | Discharge status: Home / Self Care | Payer: Managed Care, Other (non HMO) | Attending: Emergency Medicine | Admitting: Emergency Medicine

## 2014-11-20 DIAGNOSIS — E7800 Pure hypercholesterolemia, unspecified: Secondary | ICD-10-CM | POA: Diagnosis not present

## 2014-11-20 DIAGNOSIS — E119 Type 2 diabetes mellitus without complications: Secondary | ICD-10-CM | POA: Insufficient documentation

## 2014-11-20 DIAGNOSIS — Z7982 Long term (current) use of aspirin: Secondary | ICD-10-CM | POA: Insufficient documentation

## 2014-11-20 DIAGNOSIS — Z86718 Personal history of other venous thrombosis and embolism: Secondary | ICD-10-CM | POA: Insufficient documentation

## 2014-11-20 DIAGNOSIS — Z794 Long term (current) use of insulin: Secondary | ICD-10-CM | POA: Insufficient documentation

## 2014-11-20 DIAGNOSIS — T148XXA Other injury of unspecified body region, initial encounter: Secondary | ICD-10-CM

## 2014-11-20 DIAGNOSIS — I10 Essential (primary) hypertension: Secondary | ICD-10-CM | POA: Diagnosis not present

## 2014-11-20 DIAGNOSIS — M7981 Nontraumatic hematoma of soft tissue: Secondary | ICD-10-CM | POA: Diagnosis present

## 2014-11-20 DIAGNOSIS — Z79899 Other long term (current) drug therapy: Secondary | ICD-10-CM | POA: Diagnosis not present

## 2014-11-20 HISTORY — DX: Acute embolism and thrombosis of unspecified deep veins of unspecified lower extremity: I82.409

## 2014-11-20 NOTE — ED Notes (Signed)
States has a migraine headache which started on Nov 2  After having a seizure..  Visitor states he thinks she struck her head when she fell.

## 2014-11-20 NOTE — ED Provider Notes (Signed)
CSN: [id number]     Arrival date & time 11/20/14  0865 History   First MD Initiated Contact with Patient 11/20/14 0804     Chief Complaint  Patient presents with  . Bleeding/Bruising     (Consider location/radiation/quality/duration/timing/severity/associated sxs/prior Treatment) HPI Comments: Patient presents with a knot to her right arm. 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 She states that she's currently taking 15 mg twice a day. She states the discomfort in her leg has improved. She denies any chest pain or shortness of breath. She noticed yesterday that she had a bruised swollen area to her right upper arm. She states that it's a little tender. She denies any generalized swelling to the arm. She denies any fevers. She denies any known injury to the area.   Past Medical History  Diagnosis Date  . Hypertension   . Diabetes mellitus   . High cholesterol   . DVT (deep venous thrombosis) [facility])    Past Surgical History  Procedure Laterality Date  . Back surgery    . Abdominal hysterectomy    . Appendectomy     Family History  Problem Relation Age of Onset  . Heart attack Mother   . Diabetes Father   . Hypertension Father   . Hypertension Sister   . Diabetes Sister   . Hypertension Brother   . Diabetes Brother   . Hyperlipidemia Neg Hx   . Sudden death Neg Hx    Social History  Substance Use Topics  . Smoking status: Never Smoker   . Smokeless tobacco: None  . Alcohol Use: No   OB History    No data available     Review of Systems  Constitutional: Negative for fever.  Gastrointestinal: Negative for nausea and vomiting.  Musculoskeletal: Negative for back pain, joint swelling, arthralgias and neck pain.  Skin: Positive for color change. Negative for wound.  Neurological: Negative for weakness, numbness and headaches.  All other systems reviewed and are negative.     Allergies  Codeine  Home Medications   Prior to Admission  medications   Medication Sig Start Date End Date Taking? Authorizing Provider  rivaroxaban (XARELTO) 10 MG TABS tablet Take 10 mg by mouth daily.   Yes Historical Provider, MD  sitaGLIPtin-metformin (JANUMET) 50-1000 MG tablet Take 1 tablet by mouth 2 (two) times daily with a meal.   Yes Historical Provider, MD  amLODipine-benazepril (LOTREL) 10-20 MG per capsule Take 1 capsule by mouth daily.      Historical Provider, MD  aspirin 81 MG tablet Take 81 mg by mouth daily.    Historical Provider, MD  B-D INS SYRINGE 0.5CC/31GX5/16 31G X 5/16" 0.5 ML MISC  06/09/12   Historical Provider, MD  escitalopram (LEXAPRO) 10 MG tablet Take 10 mg by mouth daily.      Historical Provider, MD  insulin glargine (LANTUS) 100 UNIT/ML injection Inject 32 Units into the skin at bedtime.     Historical Provider, MD  simvastatin (ZOCOR) 10 MG tablet Take 10 mg by mouth at bedtime.      Historical Provider, MD   BP 152/94 mmHg  Pulse 92  Temp(Src) 98.6 F (37 C) (Oral)  Resp 16  Ht  (1.676 m)  Wt 188 lb (85.276 kg)  BMI 30.36 kg/m2  SpO2 98% Physical Exam  Constitutional: She is oriented to person, place, and time. She appears well-developed and well-nourished.  HENT:  Head: Normocephalic and atraumatic.  Eyes: Pupils are equal,  round, and reactive to light.  Neck: Normal range of motion. Neck supple.  Cardiovascular: Normal rate, regular rhythm and normal heart sounds.   Pulmonary/Chest: Effort normal and breath sounds normal. No respiratory distress. She has no wheezes. She has no rales. She exhibits no tenderness.  Abdominal: Soft. Bowel sounds are normal. There is no tenderness. There is no rebound and no guarding.  Musculoskeletal: Normal range of motion.  Patient has a 3-4 cm in diameter circular ecchymotic area to her right upper arm, on the ulnar side just proximal to the elbow. There is no swelling to the arm. There is no warmth or erythema. There is a small tender knot about 1 cm in diameter in  the center of the ecchymotic area. There is no induration or fluctuance. Radial pulses are intact.  Lymphadenopathy:    She has no cervical adenopathy.  Neurological: She is alert and oriented to person, place, and time.  Skin: Skin is warm and dry. No rash noted.  Psychiatric: She has a normal mood and affect.    ED Course  Procedures (including critical care time) Labs Review Labs Reviewed - No data to display  Imaging Review No results found. I have personally reviewed and evaluated these images and lab results as part of my medical decision-making.   EKG Interpretation None      MDM   Final diagnoses:  Hematoma    Patient has a small area of ecchymosis with a likely a small underlying hematoma. I don't see any evidence of infection. There is no generalized swelling of the arm which would be more suggestive of a upper extremity DVT. Advised patient to use warm compresses. Advised her to use Tylenol as needed for pain and follow-up with her PCP as needed.    Rolan BuccoMelanie Kahle Mcqueen, MD 11/20/14 [phone]

## 2015-01-29 ENCOUNTER — Encounter (HOSPITAL_COMMUNITY): Payer: Managed Care, Other (non HMO)

## 2015-01-31 ENCOUNTER — Ambulatory Visit (HOSPITAL_COMMUNITY)
Admission: RE | Admit: 2015-01-31 | Discharge: 2015-01-31 | Disposition: A | Discharge status: Home / Self Care | Payer: Managed Care, Other (non HMO) | Source: Ambulatory Visit | Attending: Vascular Surgery | Admitting: Vascular Surgery

## 2015-01-31 ENCOUNTER — Other Ambulatory Visit (HOSPITAL_COMMUNITY): Payer: Self-pay | Admitting: Internal Medicine

## 2015-01-31 DIAGNOSIS — M79662 Pain in left lower leg: Secondary | ICD-10-CM

## 2015-09-09 ENCOUNTER — Encounter (HOSPITAL_BASED_OUTPATIENT_CLINIC_OR_DEPARTMENT_OTHER): Payer: Self-pay | Admitting: Emergency Medicine

## 2015-09-09 ENCOUNTER — Emergency Department (HOSPITAL_BASED_OUTPATIENT_CLINIC_OR_DEPARTMENT_OTHER): Payer: Managed Care, Other (non HMO)

## 2015-09-09 ENCOUNTER — Emergency Department (HOSPITAL_BASED_OUTPATIENT_CLINIC_OR_DEPARTMENT_OTHER)
Admission: EM | Admit: 2015-09-09 | Discharge: 2015-09-09 | Disposition: A | Discharge status: Home / Self Care | Payer: Managed Care, Other (non HMO) | Attending: Emergency Medicine | Admitting: Emergency Medicine

## 2015-09-09 DIAGNOSIS — Z7982 Long term (current) use of aspirin: Secondary | ICD-10-CM | POA: Insufficient documentation

## 2015-09-09 DIAGNOSIS — M25561 Pain in right knee: Secondary | ICD-10-CM | POA: Diagnosis present

## 2015-09-09 DIAGNOSIS — Z794 Long term (current) use of insulin: Secondary | ICD-10-CM | POA: Insufficient documentation

## 2015-09-09 DIAGNOSIS — I10 Essential (primary) hypertension: Secondary | ICD-10-CM | POA: Insufficient documentation

## 2015-09-09 DIAGNOSIS — Z79899 Other long term (current) drug therapy: Secondary | ICD-10-CM | POA: Insufficient documentation

## 2015-09-09 DIAGNOSIS — E119 Type 2 diabetes mellitus without complications: Secondary | ICD-10-CM | POA: Insufficient documentation

## 2015-09-09 HISTORY — DX: Unspecified osteoarthritis, unspecified site: M19.90

## 2015-09-09 MED ORDER — HYDROCODONE-ACETAMINOPHEN 5-325 MG PO TABS
1.0000 | ORAL_TABLET | Freq: Once | ORAL | Status: AC
  Administered 2015-09-09: 1 via ORAL
  Filled 2015-09-09: qty 1

## 2015-09-09 MED ORDER — HYDROCODONE-ACETAMINOPHEN 5-325 MG PO TABS
0.5000 | ORAL_TABLET | Freq: Once | ORAL | Status: DC
  Filled 2015-09-09: qty 1

## 2015-09-09 MED ORDER — MELOXICAM 7.5 MG PO TABS: 7.5000 mg | ORAL_TABLET | Freq: Every day | ORAL | 0 refills | Status: DC

## 2015-09-09 NOTE — ED Notes (Signed)
Went to give pt her med and she told me that "that won't touch that". States she took someone else's Vicodin yesterday along with Meloxicam and Aleve and it didn't help.Pt's RN advised and will notify EDP.

## 2015-09-09 NOTE — Discharge Instructions (Signed)
It was my pleasure taking care of you today!  Keep your scheduled appointment with the orthopedist.  Return to ER for new or worsening symptoms, any additional concerns.   COLD THERAPY DIRECTIONS:  Ice or gel packs can be used to reduce both pain and swelling. Ice is the most helpful within the first 24 to 48 hours after an injury or flareup from overusing a muscle or joint.  Ice is effective, has very few side effects, and is safe for most people to use.   If you expose your skin to cold temperatures for too long or without the proper protection, you can damage your skin or nerves. Watch for signs of skin damage due to cold.   HOME CARE INSTRUCTIONS  Follow these tips to use ice and cold packs safely.  Place a dry or damp towel between the ice and skin. A damp towel will cool the skin more quickly, so you may need to shorten the time that the ice is used.  For a more rapid response, add gentle compression to the ice.  Ice for no more than 10 to 20 minutes at a time. The bonier the area you are icing, the less time it will take to get the benefits of ice.  Check your skin after 5 minutes to make sure there are no signs of a poor response to cold or skin damage.  Rest 20 minutes or more in between uses.  Once your skin is numb, you can end your treatment. You can test numbness by very lightly touching your skin. The touch should be so light that you do not see the skin dimple from the pressure of your fingertip. When using ice, most people will feel these normal sensations in this order: cold, burning, aching, and numbness.

## 2015-09-09 NOTE — ED Notes (Signed)
PA-C at bedside discussing plan of care.

## 2015-09-09 NOTE — ED Triage Notes (Signed)
Patient states that she has pain to her right knee, The patient reports arthritis to her knee and constant pain, however today she was on her way into the [place] and her knee gave out. Denies a fall

## 2015-09-09 NOTE — ED Notes (Signed)
Pt given d/c instructions as per chart. Verbalizes understanding. No questions. Rx x 1 

## 2015-09-09 NOTE — ED Provider Notes (Signed)
[facility] Provider Note   CSN: [id number] Arrival date & time: 09/09/15  1434     History   Chief Complaint Chief Complaint  Patient presents with  . Knee Pain    HPI Kristin Anthony is a 65 y.o. female.  The history is provided by the patient and medical records. No language interpreter was used.   Kristin Anthony is a 65 y.o. female  with a PMH of DM, HTN, HLD and arthritis who presents to the Emergency Department complaining of acute on chronic right knee pain. Patient states she has struggled with right knee pain now for several months and has an appointment with the orthopedist on Tuesday (in two days). Today, patient was going to the store when her right knee "locked up". She states that she could not move it and that it was stuck in that position for a few minutes. She now can bend it, but knee is causing her much more pain than usual. Endorses right knee swelling. Denies numbness, tingling, muscle weakness. No hx of surgeries to RLE. Aleve taken with no relief. Took a friend's mobic in the last which which did help much more than Aleve.    Past Medical History:  Diagnosis Date  . Arthritis   . Diabetes mellitus   . DVT (deep venous thrombosis) (HCC)   . High cholesterol   . Hypertension     Patient Active Problem List   Diagnosis Date Noted  . Closed fracture of tuft of distal phalanx of right thumb 08/31/2012    Past Surgical History:  Procedure Laterality Date  . ABDOMINAL HYSTERECTOMY    . APPENDECTOMY    . BACK SURGERY      OB History    No data available       Home Medications    Prior to Admission medications   Medication Sig Start Date End Date Taking? Authorizing Provider  amLODipine-benazepril (LOTREL) 10-20 MG per capsule Take 1 capsule by mouth daily.      Historical Provider, MD  aspirin 81 MG tablet Take 81 mg by mouth daily.    Historical Provider, MD  B-D INS SYRINGE 0.5CC/31GX5/16 31G X 5/16" 0.5 ML MISC  06/09/12    Historical Provider, MD  escitalopram (LEXAPRO) 10 MG tablet Take 10 mg by mouth daily.      Historical Provider, MD  insulin glargine (LANTUS) 100 UNIT/ML injection Inject 32 Units into the skin at bedtime.     Historical Provider, MD  meloxicam (MOBIC) 7.5 MG tablet Take 1 tablet (7.5 mg total) by mouth daily. 09/09/15   Chase PicketJaime Pilcher Nishant Schrecengost, PA-C  rivaroxaban (XARELTO) 10 MG TABS tablet Take 10 mg by mouth daily.    Historical Provider, MD  simvastatin (ZOCOR) 10 MG tablet Take 10 mg by mouth at bedtime.      Historical Provider, MD  sitaGLIPtin-metformin (JANUMET) 50-1000 MG tablet Take 1 tablet by mouth 2 (two) times daily with a meal.    Historical Provider, MD    Family History Family History  Problem Relation Age of Onset  . Heart attack Mother   . Diabetes Father   . Hypertension Father   . Hypertension Sister   . Diabetes Sister   . Hypertension Brother   . Diabetes Brother   . Hyperlipidemia Neg Hx   . Sudden death Neg Hx     Social History Social History  Substance Use Topics  . Smoking status: Never Smoker  . Smokeless tobacco: Never Used  .  Alcohol use No     Allergies   Codeine   Review of Systems Review of Systems  Musculoskeletal: Positive for arthralgias and joint swelling.  Neurological: Negative for weakness and numbness.     Physical Exam Updated Vital Signs BP 156/89 (BP Location: Right Arm)   Pulse 79   Temp 98.5 F (36.9 C) (Oral)   Resp 20   Ht 5\' 6"  (1.676 m)   Wt 84.4 kg   SpO2 100%   BMI 30.02 kg/m   Physical Exam  Constitutional: She is oriented to person, place, and time. She appears well-developed and well-nourished. No distress.  HENT:  Head: Normocephalic and atraumatic.  Cardiovascular: Normal rate, regular rhythm, normal heart sounds and intact distal pulses.   No murmur heard. Pulmonary/Chest: Effort normal. No respiratory distress.  Abdominal: Soft. She exhibits no distension. There is no tenderness.  Musculoskeletal:    Right knee: No gross deformity noted. Knee with full ROM, however pain with flexion/extension. + ttp of medial joint line.There is no joint effusion or swelling appreciated. No abnormal alignment or patellar mobility. No bruising, erythema, or warmth overlaying the joint. No varus/valgus laxity. Negative drawer's, negative Lachman's, pain with McMurray's but no crepitus.  2+ DP pulses bilaterally. All compartments are soft. Sensation intact distal to injury.  Neurological: She is alert and oriented to person, place, and time.  Skin: Skin is warm and dry.  Nursing note and vitals reviewed.    ED Treatments / Results  Labs (all labs ordered are listed, but only abnormal results are displayed) Labs Reviewed - No data to display  EKG  EKG Interpretation None       Radiology Dg Knee Complete 4 Views Right  Result Date: 09/09/2015 CLINICAL DATA:  RIGHT knee pain EXAM: RIGHT KNEE - COMPLETE 4+ VIEW COMPARISON:  None. FINDINGS: No fracture of the proximal tibia or distal femur. Patella is normal. No joint effusion. IMPRESSION: No acute osseous abnormality. Electronically Signed   By: Genevive Bi M.D.   On: 09/09/2015 16:03    Procedures Procedures (including critical care time)  Medications Ordered in ED Medications  HYDROcodone-acetaminophen (NORCO/VICODIN) 5-325 MG per tablet 1 tablet (1 tablet Oral Given 09/09/15 1604)     Initial Impression / Assessment and Plan / ED Course  I have reviewed the triage vital signs and the nursing notes.  Pertinent labs & imaging results that were available during my care of the patient were reviewed by me and considered in my medical decision making (see chart for details).  Clinical Course   Kristin Anthony is a 65 y.o. female who presents to ED for acute on chronic right knee pain. Right knee locked up on her today causing increase in pain. On exam, RLE is NVI. She does exhibit tenderness to palpation over the medial joint line and have  pain with McMurray's. No crepitus. X-ray was obtained which was unremarkable. Knee sleeve provided in ED. Mobic rx given. RICE discussed. Patient has an appointment with ortho the day after tomorrow and I encouraged her to keep scheduled appointment. Reasons to return to ED discussed and all questions answered.   Final Clinical Impressions(s) / ED Diagnoses   Final diagnoses:  Knee pain, right    New Prescriptions Discharge Medication List as of 09/09/2015  5:11 PM    START taking these medications   Details  meloxicam (MOBIC) 7.5 MG tablet Take 1 tablet (7.5 mg total) by mouth daily., Starting Sun 09/09/2015, Print  [facility] Augusta Hilbert, PA-C 09/09/15 1809    Laurence Spates, MD 09/10/15 2156

## 2015-09-13 ENCOUNTER — Other Ambulatory Visit: Payer: Self-pay | Admitting: Orthopaedic Surgery

## 2015-09-13 ENCOUNTER — Ambulatory Visit
Admission: RE | Admit: 2015-09-13 | Discharge: 2015-09-13 | Disposition: A | Discharge status: Home / Self Care | Payer: Managed Care, Other (non HMO) | Source: Ambulatory Visit | Attending: Orthopaedic Surgery | Admitting: Orthopaedic Surgery

## 2015-09-13 DIAGNOSIS — M25561 Pain in right knee: Secondary | ICD-10-CM

## 2015-10-02 ENCOUNTER — Other Ambulatory Visit: Payer: Self-pay | Admitting: Orthopaedic Surgery

## 2015-10-02 DIAGNOSIS — M7989 Other specified soft tissue disorders: Secondary | ICD-10-CM

## 2015-10-02 DIAGNOSIS — M79604 Pain in right leg: Secondary | ICD-10-CM

## 2015-10-03 ENCOUNTER — Ambulatory Visit
Admission: RE | Admit: 2015-10-03 | Discharge: 2015-10-03 | Disposition: A | Discharge status: Home / Self Care | Payer: Managed Care, Other (non HMO) | Source: Ambulatory Visit | Attending: Orthopaedic Surgery | Admitting: Orthopaedic Surgery

## 2015-10-03 DIAGNOSIS — M7989 Other specified soft tissue disorders: Secondary | ICD-10-CM

## 2015-10-03 DIAGNOSIS — M79604 Pain in right leg: Secondary | ICD-10-CM

## 2015-10-20 ENCOUNTER — Emergency Department (HOSPITAL_BASED_OUTPATIENT_CLINIC_OR_DEPARTMENT_OTHER): Payer: Managed Care, Other (non HMO)

## 2015-10-20 ENCOUNTER — Emergency Department (HOSPITAL_BASED_OUTPATIENT_CLINIC_OR_DEPARTMENT_OTHER)
Admission: EM | Admit: 2015-10-20 | Discharge: 2015-10-20 | Disposition: A | Discharge status: Home / Self Care | Payer: Managed Care, Other (non HMO) | Attending: Emergency Medicine | Admitting: Emergency Medicine

## 2015-10-20 ENCOUNTER — Encounter (HOSPITAL_BASED_OUTPATIENT_CLINIC_OR_DEPARTMENT_OTHER): Payer: Self-pay | Admitting: *Deleted

## 2015-10-20 DIAGNOSIS — Z7982 Long term (current) use of aspirin: Secondary | ICD-10-CM | POA: Insufficient documentation

## 2015-10-20 DIAGNOSIS — Z79899 Other long term (current) drug therapy: Secondary | ICD-10-CM | POA: Insufficient documentation

## 2015-10-20 DIAGNOSIS — M62830 Muscle spasm of back: Secondary | ICD-10-CM | POA: Insufficient documentation

## 2015-10-20 DIAGNOSIS — I10 Essential (primary) hypertension: Secondary | ICD-10-CM | POA: Insufficient documentation

## 2015-10-20 DIAGNOSIS — Z794 Long term (current) use of insulin: Secondary | ICD-10-CM | POA: Diagnosis not present

## 2015-10-20 DIAGNOSIS — E119 Type 2 diabetes mellitus without complications: Secondary | ICD-10-CM | POA: Insufficient documentation

## 2015-10-20 DIAGNOSIS — M549 Dorsalgia, unspecified: Secondary | ICD-10-CM | POA: Diagnosis present

## 2015-10-20 DIAGNOSIS — M62838 Other muscle spasm: Secondary | ICD-10-CM

## 2015-10-20 LAB — URINALYSIS, ROUTINE W REFLEX MICROSCOPIC
BILIRUBIN URINE: NEGATIVE
Glucose, UA: 250 mg/dL — AB
HGB URINE DIPSTICK: NEGATIVE
KETONES UR: NEGATIVE mg/dL
Leukocytes, UA: NEGATIVE
Nitrite: NEGATIVE
PH: 7 (ref 5.0–8.0)
Protein, ur: NEGATIVE mg/dL
Specific Gravity, Urine: 1.012 (ref 1.005–1.030)

## 2015-10-20 MED ORDER — METHOCARBAMOL 500 MG PO TABS: 500.0000 mg | ORAL_TABLET | Freq: Two times a day (BID) | ORAL | 0 refills | Status: DC

## 2015-10-20 NOTE — ED Provider Notes (Signed)
[facility] Provider Note   CSN: [id number] Arrival date & time: 10/20/15  0106     History   Chief Complaint Chief Complaint  Patient presents with  . Flank Pain    HPI Kristin Anthony is a 65 y.o. female.  HPI  PT with hx of DVT, DM comes in with cc of back pain. Pt has R sided back pain x 2-3 months. Pain is getting more constant. PT is concerned for rib fx or gall bladder dz. The pain is not worse with breathing, food. Pain is worse with certain movements. No hx of pain like this in the past.   Past Medical History:  Diagnosis Date  . Arthritis   . Diabetes mellitus   . DVT (deep venous thrombosis) (HCC)   . High cholesterol   . Hypertension     Patient Active Problem List   Diagnosis Date Noted  . Closed fracture of tuft of distal phalanx of right thumb 08/31/2012    Past Surgical History:  Procedure Laterality Date  . ABDOMINAL HYSTERECTOMY    . APPENDECTOMY    . BACK SURGERY    . KNEE SURGERY      OB History    No data available       Home Medications    Prior to Admission medications   Medication Sig Start Date End Date Taking? Authorizing Provider  losartan (COZAAR) 25 MG tablet Take 25 mg by mouth daily.   Yes Historical Provider, MD  amLODipine-benazepril (LOTREL) 10-20 MG per capsule Take 1 capsule by mouth daily.      Historical Provider, MD  aspirin 81 MG tablet Take 81 mg by mouth daily.    Historical Provider, MD  B-D INS SYRINGE 0.5CC/31GX5/16 31G X 5/16" 0.5 ML MISC  06/09/12   Historical Provider, MD  escitalopram (LEXAPRO) 10 MG tablet Take 10 mg by mouth daily.      Historical Provider, MD  insulin glargine (LANTUS) 100 UNIT/ML injection Inject 32 Units into the skin at bedtime.     Historical Provider, MD  meloxicam (MOBIC) 7.5 MG tablet Take 1 tablet (7.5 mg total) by mouth daily. 09/09/15   Chase PicketJaime Pilcher Ward, PA-C  methocarbamol (ROBAXIN) 500 MG tablet Take 1 tablet (500 mg total) by mouth 2 (two) times daily. 10/20/15    Derwood KaplanAnkit Caroline Matters, MD  rivaroxaban (XARELTO) 10 MG TABS tablet Take 10 mg by mouth daily.    Historical Provider, MD  simvastatin (ZOCOR) 10 MG tablet Take 10 mg by mouth at bedtime.      Historical Provider, MD  sitaGLIPtin-metformin (JANUMET) 50-1000 MG tablet Take 1 tablet by mouth 2 (two) times daily with a meal.    Historical Provider, MD    Family History Family History  Problem Relation Age of Onset  . Heart attack Mother   . Diabetes Father   . Hypertension Father   . Hypertension Sister   . Diabetes Sister   . Hypertension Brother   . Diabetes Brother   . Hyperlipidemia Neg Hx   . Sudden death Neg Hx     Social History Social History  Substance Use Topics  . Smoking status: Never Smoker  . Smokeless tobacco: Never Used  . Alcohol use No     Allergies   Codeine   Review of Systems Review of Systems  ROS 10 Systems reviewed and are negative for acute change except as noted in the HPI.     Physical Exam Updated Vital Signs BP 141/71 (  BP Location: Left Arm)   Pulse 88   Temp 98.1 F (36.7 C) (Oral)   Resp 18   Ht 5\' 6"  (1.676 m)   Wt 188 lb (85.3 kg)   SpO2 97%   BMI 30.34 kg/m   Physical Exam  Constitutional: She is oriented to person, place, and time. She appears well-developed and well-nourished.  HENT:  Head: Normocephalic and atraumatic.  Eyes: EOM are normal. Pupils are equal, round, and reactive to light.  Neck: Neck supple.  Cardiovascular: Normal rate, regular rhythm and normal heart sounds.   No murmur heard. Pulmonary/Chest: Effort normal. No respiratory distress.  Abdominal: Soft. She exhibits no distension. There is no tenderness. There is no rebound and no guarding.  Musculoskeletal:  Reproducible tenderness over the Rt lower chest region posteriorly   Neurological: She is alert and oriented to person, place, and time.  Skin: Skin is warm and dry.  Nursing note and vitals reviewed.    ED Treatments / Results  Labs (all labs  ordered are listed, but only abnormal results are displayed) Labs Reviewed  URINALYSIS, ROUTINE W REFLEX MICROSCOPIC (NOT AT [facility]) - Abnormal; Notable for the following:       Result Value   Glucose, UA 250 (*)    All other components within normal limits    EKG  EKG Interpretation None       Radiology Dg Ribs Unilateral W/chest Right  Result Date: 10/20/2015 CLINICAL DATA:  Acute onset of right anterior and lateral rib pain. Initial encounter. EXAM: RIGHT RIBS AND CHEST - 3+ VIEW COMPARISON:  Chest radiograph and CTA of the chest performed 10/06/2015 FINDINGS: No displaced rib fractures are seen. The lungs are well-aerated. Mild vascular congestion is noted. There is no evidence of focal opacification, pleural effusion or pneumothorax. The cardiomediastinal silhouette is borderline enlarged. No acute osseous abnormalities are seen. IMPRESSION: Mild vascular congestion and borderline cardiomegaly. Lungs remain grossly clear. No displaced rib fracture seen. Electronically Signed   By: Roanna Raider M.D.   On: 10/20/2015 05:05    Procedures Procedures (including critical care time)  Medications Ordered in ED Medications - No data to display   Initial Impression / Assessment and Plan / ED Course  I have reviewed the triage vital signs and the nursing notes.  Pertinent labs & imaging results that were available during my care of the patient were reviewed by me and considered in my medical decision making (see chart for details).  Clinical Course   Pt has R sided tenderness with spasms as the cause. Xrays are normal. Will d.c.  Final Clinical Impressions(s) / ED Diagnoses   Final diagnoses:  Muscle spasm    New Prescriptions New Prescriptions   METHOCARBAMOL (ROBAXIN) 500 MG TABLET    Take 1 tablet (500 mg total) by mouth 2 (two) times daily.     Derwood Kaplan, MD 10/20/15 [phone]

## 2015-10-20 NOTE — ED Notes (Signed)
Pt returned from xray

## 2015-10-20 NOTE — ED Notes (Signed)
Pt verbalizes understanding of d/c instructions and denies any further needs at this time. 

## 2015-10-20 NOTE — ED Triage Notes (Signed)
C/o rt  flank pain x 2-3 months that comes and goes but is more often  States is here because it is not getting any better, denies urinary sx

## 2015-10-20 NOTE — ED Notes (Signed)
Patient transported to X-ray 

## 2015-10-29 ENCOUNTER — Ambulatory Visit (INDEPENDENT_AMBULATORY_CARE_PROVIDER_SITE_OTHER): Payer: Managed Care, Other (non HMO) | Admitting: Orthopaedic Surgery

## 2015-10-29 DIAGNOSIS — M25561 Pain in right knee: Secondary | ICD-10-CM

## 2015-11-22 ENCOUNTER — Inpatient Hospital Stay (INDEPENDENT_AMBULATORY_CARE_PROVIDER_SITE_OTHER): Payer: Self-pay | Admitting: Orthopaedic Surgery

## 2015-11-27 ENCOUNTER — Ambulatory Visit (INDEPENDENT_AMBULATORY_CARE_PROVIDER_SITE_OTHER): Payer: Managed Care, Other (non HMO) | Admitting: Orthopaedic Surgery

## 2015-11-27 ENCOUNTER — Encounter (INDEPENDENT_AMBULATORY_CARE_PROVIDER_SITE_OTHER): Payer: Self-pay | Admitting: Orthopaedic Surgery

## 2015-11-27 DIAGNOSIS — M1711 Unilateral primary osteoarthritis, right knee: Secondary | ICD-10-CM | POA: Diagnosis not present

## 2015-11-27 NOTE — Progress Notes (Signed)
Office Visit Note   Patient: Kristin Anthony           Date of Birth: 02-14-1950           MRN: [id number] Visit Date: 11/27/2015              Requested by: Kristin CornJohn Russo, MD [address] [place], [place] [address] PCP: Kristin PoundsUSSO,JOHN M, MD   Assessment & Plan: Visit Diagnoses:  1. Unilateral primary osteoarthritis, right knee     Plan:  - right knee cortisone injection given today - pamphlet for TKA and monovisc given today - f/u for monovisc injection - patient is severely limited by her DJD  Follow-Up Instructions: No Follow-up on file.   Orders:  No orders of the defined types were placed in this encounter.  No orders of the defined types were placed in this encounter.     Procedures: Large Joint Inj Date/Time: 11/27/2015 12:50 PM Performed by: Tarry KosXU, NAIPING M Authorized by: Tarry KosXU, NAIPING M   Consent Given by:  Patient Timeout: prior to procedure the correct patient, procedure, and site was verified   Indications:  Pain Location:  Knee Site:  R knee Prep: patient was prepped and draped in usual sterile fashion   Needle Size:  22 G Approach:  Anterolateral Ultrasound Guidance: No   Fluoroscopic Guidance: No       Clinical Data: No additional findings.   Subjective: Chief Complaint  Patient presents with  . Right Knee - Pain    2 month follow up for right knee scope.  Continues to have pain almost entirely medially that's worse with activity.  Doesn't feel surgery has helped much at all.  Subjective swelling.      Review of Systems   Objective: Vital Signs: There were no vitals taken for this visit.  Physical Exam  Musculoskeletal:       Right knee: She exhibits no effusion.    Right Knee Exam   Tenderness  The patient is experiencing tenderness in the medial joint line.  Range of Motion  The patient has normal right knee ROM.  Tests  Varus: negative Valgus: negative  Other  Sensation: normal Pulse: present Swelling: none Other  tests: no effusion present      Specialty Comments:  No specialty comments available.  Imaging: No results found.   PMFS History: Patient Active Problem List   Diagnosis Date Noted  . Closed fracture of tuft of distal phalanx of right thumb 08/31/2012   Past Medical History:  Diagnosis Date  . Arthritis   . Diabetes mellitus   . DVT (deep venous thrombosis) (HCC)   . High cholesterol   . Hypertension     Family History  Problem Relation Age of Onset  . Heart attack Mother   . Diabetes Father   . Hypertension Father   . Hypertension Sister   . Diabetes Sister   . Hypertension Brother   . Diabetes Brother   . Hyperlipidemia Neg Hx   . Sudden death Neg Hx     Past Surgical History:  Procedure Laterality Date  . ABDOMINAL HYSTERECTOMY    . APPENDECTOMY    . BACK SURGERY    . KNEE SURGERY     Social History   Occupational History  . Not on file.   Social History Main Topics  . Smoking status: Never Smoker  . Smokeless tobacco: Never Used  . Alcohol use No  . Drug use: No  . Sexual activity: Not on  file

## 2015-12-03 ENCOUNTER — Telehealth (INDEPENDENT_AMBULATORY_CARE_PROVIDER_SITE_OTHER): Payer: Self-pay | Admitting: Radiology

## 2015-12-03 NOTE — Telephone Encounter (Signed)
Patient is calling to see if her insurance approved injection and if so when can she come in.

## 2015-12-04 NOTE — Telephone Encounter (Signed)
URGENT Patient is calling again and asking about gel injection.  She is planning to go out of town tomorrow, will be back Friday.  Can she come in Monday if insurance has approved?  She is anxious to get this done since holidays are coming.  Pls call her, thanks.

## 2015-12-04 NOTE — Telephone Encounter (Signed)
Called patient back to advise Gel injections we were waiting to here back from ins. Per ins we are needing PA on the monovisc inj. Patient aware

## 2016-03-18 ENCOUNTER — Ambulatory Visit (INDEPENDENT_AMBULATORY_CARE_PROVIDER_SITE_OTHER): Payer: Managed Care, Other (non HMO)

## 2016-03-18 ENCOUNTER — Ambulatory Visit (INDEPENDENT_AMBULATORY_CARE_PROVIDER_SITE_OTHER): Payer: Managed Care, Other (non HMO) | Admitting: Orthopaedic Surgery

## 2016-03-18 ENCOUNTER — Encounter (INDEPENDENT_AMBULATORY_CARE_PROVIDER_SITE_OTHER): Payer: Self-pay | Admitting: Orthopaedic Surgery

## 2016-03-18 DIAGNOSIS — G8929 Other chronic pain: Secondary | ICD-10-CM

## 2016-03-18 DIAGNOSIS — M25562 Pain in left knee: Secondary | ICD-10-CM | POA: Diagnosis not present

## 2016-03-18 DIAGNOSIS — M1711 Unilateral primary osteoarthritis, right knee: Secondary | ICD-10-CM | POA: Insufficient documentation

## 2016-03-18 MED ORDER — LIDOCAINE HCL 1 % IJ SOLN
2.0000 mL | INTRAMUSCULAR | Status: AC | PRN
  Administered 2016-03-18: 2 mL

## 2016-03-18 MED ORDER — BUPIVACAINE HCL 0.5 % IJ SOLN
2.0000 mL | INTRAMUSCULAR | Status: AC | PRN
  Administered 2016-03-18: 2 mL via INTRA_ARTICULAR

## 2016-03-18 MED ORDER — METHYLPREDNISOLONE ACETATE 40 MG/ML IJ SUSP
40.0000 mg | INTRAMUSCULAR | Status: AC | PRN
  Administered 2016-03-18: 40 mg via INTRA_ARTICULAR

## 2016-03-18 NOTE — Progress Notes (Signed)
Office Visit Note   Patient: Kristin Anthony           Date of Birth: 12-May-1950           MRN: [id number] Visit Date: 03/18/2016              Requested by: Creola Corn, MD [address] [place], [place] [address] PCP: Gwen Pounds, MD   Assessment & Plan: Visit Diagnoses:  1. Unilateral primary osteoarthritis, right knee   2. Chronic pain of left knee     Plan: For the right knee she would like to proceed with a total knee replacement. We'll then have to into her hemoglobin A1c below 8 before he can to proceed. We will plan on putting her on xarelto postoperatively. Left knee was injected today with cortisone. We'll see her back in 6 weeks to redraw hemoglobin A1c  Follow-Up Instructions: Return in about 6 weeks (around 04/29/2016).   Orders:  Orders Placed This Encounter  Procedures  . Large Joint Injection/Arthrocentesis  . XR KNEE 3 VIEW LEFT  . XR KNEE 3 VIEW RIGHT   No orders of the defined types were placed in this encounter.     Procedures: Large Joint Inj Date/Time: 03/18/2016 9:42 AM Performed by: Tarry Kos Authorized by: Tarry Kos   Consent Given by:  Patient Timeout: prior to procedure the correct patient, procedure, and site was verified   Indications:  Pain Location:  Knee Site:  R knee Prep: patient was prepped and draped in usual sterile fashion   Needle Size:  22 G Ultrasound Guidance: No   Fluoroscopic Guidance: No   Arthrogram: No   Medications:  2 mL lidocaine 1 %; 2 mL bupivacaine 0.5 %; 40 mg methylPREDNISolone acetate 40 MG/ML Patient tolerance:  Patient tolerated the procedure well with no immediate complications     Clinical Data: No additional findings.   Subjective: Chief Complaint  Patient presents with  . Left Knee - Pain  . Right Knee - Pain    She comes in today for bilateral knee pain worse on the right. She is status post knee arthroscopy. She is not feeling any better. She wants to have a knee replacement  however her last hemoglobin A1c from a week ago was over 9. She is also had a DVT in the past.    Review of Systems  Constitutional: Negative.   HENT: Negative.   Eyes: Negative.   Respiratory: Negative.   Cardiovascular: Negative.   Endocrine: Negative.   Musculoskeletal: Negative.   Neurological: Negative.   Hematological: Negative.   Psychiatric/Behavioral: Negative.   All other systems reviewed and are negative.    Objective: Vital Signs: There were no vitals taken for this visit.  Physical Exam  Constitutional: She is oriented to person, place, and time. She appears well-developed and well-nourished.  Pulmonary/Chest: Effort normal.  Neurological: She is alert and oriented to person, place, and time.  Skin: Skin is warm. Capillary refill takes less than 2 seconds.  Psychiatric: She has a normal mood and affect. Her behavior is normal. Judgment and thought content normal.  Nursing note and vitals reviewed.   Ortho Exam Exam of bilateral knee shows no joint effusion. Normal range of motion. Collaterals and cruciates are stable. Specialty Comments:  No specialty comments available.  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 Unilateral primary osteoarthritis, right knee 03/18/2016  . Closed fracture of tuft of distal phalanx of right thumb 08/31/2012   Past Medical History:  Diagnosis Date  . Arthritis   . Diabetes mellitus   . DVT (deep venous thrombosis) (HCC)   . High cholesterol   . Hypertension     Family History  Problem Relation Age of Onset  . Heart attack Mother   . Diabetes Father   . Hypertension Father   . Hypertension Sister   . Diabetes Sister   . Hypertension Brother   . Diabetes Brother   .  Hyperlipidemia Neg Hx   . Sudden death Neg Hx     Past Surgical History:  Procedure Laterality Date  . ABDOMINAL HYSTERECTOMY    . APPENDECTOMY    . BACK SURGERY    . KNEE SURGERY     Social History   Occupational History  . Not on file.   Social History Main Topics  . Smoking status: Never Smoker  . Smokeless tobacco: Never Used  . Alcohol use No  . Drug use: No  . Sexual activity: Not on file

## 2016-04-29 ENCOUNTER — Encounter (INDEPENDENT_AMBULATORY_CARE_PROVIDER_SITE_OTHER): Payer: Self-pay | Admitting: Orthopaedic Surgery

## 2016-04-29 ENCOUNTER — Ambulatory Visit (INDEPENDENT_AMBULATORY_CARE_PROVIDER_SITE_OTHER): Payer: Managed Care, Other (non HMO) | Admitting: Orthopaedic Surgery

## 2016-04-29 DIAGNOSIS — M1711 Unilateral primary osteoarthritis, right knee: Secondary | ICD-10-CM

## 2016-04-29 NOTE — Addendum Note (Signed)
Addended by: Albertina Parr on: 04/29/2016 08:28 AM   Modules accepted: Orders

## 2016-04-29 NOTE — Progress Notes (Addendum)
   Office Visit Note   Patient: Kristin Anthony           Date of Birth: February 06, 1950           MRN: [id number] Visit Date: 04/29/2016              Requested by: Creola Corn, MD [address] [place], [place] [address] PCP: Gwen Pounds, MD   Assessment & Plan: Visit Diagnoses:  1. Unilateral primary osteoarthritis, right knee     Plan: A1c level obtained today.  Will call patient with results if able to proceed with right TKA.    Addendum: patient's A1c level was greater than 10.  Patient advised to see PCP about better diabetic control.  Would need A1c < 8 to proceed with knee replacement.    Follow-Up Instructions: Return if symptoms worsen or fail to improve.   Orders:  No orders of the defined types were placed in this encounter.  No orders of the defined types were placed in this encounter.     Procedures: No procedures performed   Clinical Data: No additional findings.   Subjective: Chief Complaint  Patient presents with  . Right Knee - Pain, Follow-up  . Left Knee - Pain, Follow-up    Patient comes in today for f/u right knee OA.  Here to check her a1c level. Denies changes in medical history    Review of Systems   Objective: Vital Signs: There were no vitals taken for this visit.  Physical Exam  Ortho Exam Right knee exam is unchanged Specialty Comments:  No specialty comments available.  Imaging: No results found.   PMFS History: Patient Active Problem List   Diagnosis Date Noted  . Unilateral primary osteoarthritis, right knee 03/18/2016  . Closed fracture of tuft of distal phalanx of right thumb 08/31/2012   Past Medical History:  Diagnosis Date  . Arthritis   . Diabetes mellitus   . DVT (deep venous thrombosis) (HCC)   . High cholesterol   . Hypertension     Family History  Problem Relation Age of Onset  . Heart attack Mother   . Diabetes Father   . Hypertension Father   . Hypertension Sister   . Diabetes Sister   .  Hypertension Brother   . Diabetes Brother   . Hyperlipidemia Neg Hx   . Sudden death Neg Hx     Past Surgical History:  Procedure Laterality Date  . ABDOMINAL HYSTERECTOMY    . APPENDECTOMY    . BACK SURGERY    . KNEE SURGERY     Social History   Occupational History  . Not on file.   Social History Main Topics  . Smoking status: Never Smoker  . Smokeless tobacco: Never Used  . Alcohol use No  . Drug use: No  . Sexual activity: Not on file

## 2016-04-30 LAB — HEMOGLOBIN A1C
Hgb A1c MFr Bld: 10.5 % — ABNORMAL HIGH (ref <5.7)
MEAN PLASMA GLUCOSE: 255 mg/dL

## 2016-04-30 NOTE — Progress Notes (Signed)
Her diabetes is poorly controlled. She needs to have a A1c<8 to be a surgical candidate.

## 2016-05-01 ENCOUNTER — Telehealth (INDEPENDENT_AMBULATORY_CARE_PROVIDER_SITE_OTHER): Payer: Self-pay | Admitting: Orthopaedic Surgery

## 2016-05-01 NOTE — Telephone Encounter (Signed)
Called pt to advise

## 2016-05-01 NOTE — Telephone Encounter (Signed)
Patient called asked for a call back concerning the results of her lab work. The number to contact patient is [phone]

## 2016-06-10 ENCOUNTER — Ambulatory Visit (INDEPENDENT_AMBULATORY_CARE_PROVIDER_SITE_OTHER): Payer: Managed Care, Other (non HMO) | Admitting: Orthopaedic Surgery

## 2016-06-10 ENCOUNTER — Encounter (INDEPENDENT_AMBULATORY_CARE_PROVIDER_SITE_OTHER): Payer: Self-pay | Admitting: Orthopaedic Surgery

## 2016-06-10 DIAGNOSIS — M1711 Unilateral primary osteoarthritis, right knee: Secondary | ICD-10-CM | POA: Diagnosis not present

## 2016-06-10 DIAGNOSIS — M1712 Unilateral primary osteoarthritis, left knee: Secondary | ICD-10-CM

## 2016-06-10 MED ORDER — LIDOCAINE HCL 1 % IJ SOLN
2.0000 mL | INTRAMUSCULAR | Status: AC | PRN
  Administered 2016-06-10: 2 mL

## 2016-06-10 MED ORDER — BUPIVACAINE HCL 0.5 % IJ SOLN
2.0000 mL | INTRAMUSCULAR | Status: AC | PRN
  Administered 2016-06-10: 2 mL via INTRA_ARTICULAR

## 2016-06-10 MED ORDER — METHYLPREDNISOLONE ACETATE 40 MG/ML IJ SUSP
40.0000 mg | INTRAMUSCULAR | Status: AC | PRN
  Administered 2016-06-10: 40 mg via INTRA_ARTICULAR

## 2016-06-10 NOTE — Addendum Note (Signed)
Addended by: Albertina ParrGARCIA, Kayia Billinger on: 06/10/2016 10:57 AM   Modules accepted: Orders

## 2016-06-10 NOTE — Progress Notes (Signed)
Office Visit Note   Patient: Kristin SchullerDianne M Allbee           Date of Birth: December 27, 1950           MRN: [id number] Visit Date: 06/10/2016              Requested by: Creola Cornusso, John, MD [address] [place], [place] [id number] PCP: Creola Cornusso, John, MD   Assessment & Plan: Visit Diagnoses:  1. Unilateral primary osteoarthritis, right knee   2. Unilateral primary osteoarthritis, left knee     Plan: Left knee was injected with cortisone today. Repeat A1c level was obtained today. We will be in touch with the patient with the result. We will schedule knee replacement if appropriate.  Follow-Up Instructions: Return if symptoms worsen or fail to improve.   Orders:  No orders of the defined types were placed in this encounter.  No orders of the defined types were placed in this encounter.     Procedures: Large Joint Inj Date/Time: 06/10/2016 9:37 AM Performed by: Tarry KosXU, Julianna Vanwagner M Authorized by: Tarry KosXU, Wandalee Klang M   Consent Given by:  Patient Timeout: prior to procedure the correct patient, procedure, and site was verified   Indications:  Pain Location:  Knee Site:  R knee Prep: patient was prepped and draped in usual sterile fashion   Needle Size:  22 G Ultrasound Guidance: No   Fluoroscopic Guidance: No   Arthrogram: No   Medications:  2 mL lidocaine 1 %; 2 mL bupivacaine 0.5 %; 40 mg methylPREDNISolone acetate 40 MG/ML Patient tolerance:  Patient tolerated the procedure well with no immediate complications     Clinical Data: No additional findings.   Subjective: Chief Complaint  Patient presents with  . Right Knee - Pain, Follow-up  . Left Knee - Pain, Follow-up    Patient follows up today for bilateral knee osteoarthritis. She is requesting a left knee injection. She has been working on her glucose control. Her last hemoglobin A1c was 10.5%. She is here today to get a repeat A1c level. She would like to have the right knee replaced as soon as possible pending A1c.    Review of  Systems   Objective: Vital Signs: There were no vitals taken for this visit.  Physical Exam  Ortho Exam Bilateral knee exam is stable and unchanged. No signs of infection or effusion. Specialty Comments:  No specialty comments available.  Imaging: No results found.   PMFS History: Patient Active Problem List   Diagnosis Date Noted  . Unilateral primary osteoarthritis, right knee 03/18/2016  . Closed fracture of tuft of distal phalanx of right thumb 08/31/2012   Past Medical History:  Diagnosis Date  . Arthritis   . Diabetes mellitus   . DVT (deep venous thrombosis) (HCC)   . High cholesterol   . Hypertension     Family History  Problem Relation Age of Onset  . Heart attack Mother   . Diabetes Father   . Hypertension Father   . Hypertension Sister   . Diabetes Sister   . Hypertension Brother   . Diabetes Brother   . Hyperlipidemia Neg Hx   . Sudden death Neg Hx     Past Surgical History:  Procedure Laterality Date  . ABDOMINAL HYSTERECTOMY    . APPENDECTOMY    . BACK SURGERY    . KNEE SURGERY     Social History   Occupational History  . Not on file.   Social History Main Topics  . Smoking  status: Never Smoker  . Smokeless tobacco: Never Used  . Alcohol use No  . Drug use: No  . Sexual activity: Not on file

## 2016-06-11 LAB — HEMOGLOBIN A1C
Hgb A1c MFr Bld: 11 % — ABNORMAL HIGH (ref <5.7)
Mean Plasma Glucose: 269 mg/dL

## 2016-06-11 NOTE — Progress Notes (Signed)
Please let her know that her a1c is actually worse than before.  Please have her follow up in 3 months.

## 2016-10-07 ENCOUNTER — Ambulatory Visit (INDEPENDENT_AMBULATORY_CARE_PROVIDER_SITE_OTHER): Payer: Managed Care, Other (non HMO)

## 2016-10-07 ENCOUNTER — Ambulatory Visit (INDEPENDENT_AMBULATORY_CARE_PROVIDER_SITE_OTHER): Payer: Managed Care, Other (non HMO) | Admitting: Orthopaedic Surgery

## 2016-10-07 DIAGNOSIS — M25561 Pain in right knee: Secondary | ICD-10-CM | POA: Diagnosis not present

## 2016-10-07 DIAGNOSIS — M25562 Pain in left knee: Secondary | ICD-10-CM

## 2016-10-07 DIAGNOSIS — M1711 Unilateral primary osteoarthritis, right knee: Secondary | ICD-10-CM | POA: Diagnosis not present

## 2016-10-07 DIAGNOSIS — G8929 Other chronic pain: Secondary | ICD-10-CM

## 2016-10-07 DIAGNOSIS — M1712 Unilateral primary osteoarthritis, left knee: Secondary | ICD-10-CM | POA: Diagnosis not present

## 2016-10-07 NOTE — Progress Notes (Signed)
Office Visit Note   Patient: Kristin Anthony           Date of Birth: 1950-11-12           MRN: [id number] Visit Date: 10/07/2016              Requested by: Creola Corn, MD [address] [place], [place] [address] PCP: Creola Corn, MD   Assessment & Plan: Visit Diagnoses:  1. Chronic pain of both knees   2. Unilateral primary osteoarthritis, right knee   3. Unilateral primary osteoarthritis, left knee     Plan: We will obtain a repeat hemoglobin A1c. Her last one in May was 11. We will also submit request for Visco supplementation injection. We will follow-up with her regarding the results of her hemoglobin A1c today. Total face to face encounter time was greater than 25 minutes and over half of this time was spent in counseling and/or coordination of care.  Follow-Up Instructions: Return if symptoms worsen or fail to improve.   Orders:  Orders Placed This Encounter  Procedures  . XR KNEE 3 VIEW LEFT  . XR KNEE 3 VIEW RIGHT   No orders of the defined types were placed in this encounter.     Procedures: No procedures performed   Clinical Data: No additional findings.   Subjective: Chief Complaint  Patient presents with  . Right Knee - Pain  . Left Knee - Pain    Patient follows up today for her right knee degenerative joint disease. This continues to affect her quality of life. Her diabetes control has not been any significantly better.    Review of Systems  Constitutional: Negative.   HENT: Negative.   Eyes: Negative.   Respiratory: Negative.   Cardiovascular: Negative.   Endocrine: Negative.   Musculoskeletal: Negative.   Neurological: Negative.   Hematological: Negative.   Psychiatric/Behavioral: Negative.   All other systems reviewed and are negative.    Objective: Vital Signs: There were no vitals taken for this visit.  Physical Exam  Constitutional: She is oriented to person, place, and time. She appears well-developed and well-nourished.   Pulmonary/Chest: Effort normal.  Neurological: She is alert and oriented to person, place, and time.  Skin: Skin is warm. Capillary refill takes less than 2 seconds.  Psychiatric: She has a normal mood and affect. Her behavior is normal. Judgment and thought content normal.  Nursing note and vitals reviewed.   Ortho Exam Right knee exam is stable Specialty Comments:  No specialty comments available.  Imaging: Xr Knee 3 View Left  Result Date: 10/07/2016 Moderate DJD  Xr Knee 3 View Right  Result Date: 10/07/2016 Advanced DJD    PMFS History: Patient Active Problem List   Diagnosis Date Noted  . Unilateral primary osteoarthritis, left knee 10/07/2016  . Chronic pain of both knees 10/07/2016  . Unilateral primary osteoarthritis, right knee 03/18/2016  . Closed fracture of tuft of distal phalanx of right thumb 08/31/2012   Past Medical History:  Diagnosis Date  . Arthritis   . Diabetes mellitus   . DVT (deep venous thrombosis) (HCC)   . High cholesterol   . Hypertension     Family History  Problem Relation Age of Onset  . Heart attack Mother   . Diabetes Father   . Hypertension Father   . Hypertension Sister   . Diabetes Sister   . Hypertension Brother   . Diabetes Brother   . Hyperlipidemia Neg Hx   . Sudden death  Neg Hx     Past Surgical History:  Procedure Laterality Date  . ABDOMINAL HYSTERECTOMY    . APPENDECTOMY    . BACK SURGERY    . KNEE SURGERY     Social History   Occupational History  . Not on file.   Social History Main Topics  . Smoking status: Never Smoker  . Smokeless tobacco: Never Used  . Alcohol use No  . Drug use: No  . Sexual activity: Not on file

## 2016-10-07 NOTE — Addendum Note (Signed)
Addended by: Albertina Parr on: 10/07/2016 11:47 AM   Modules accepted: Orders

## 2016-10-08 LAB — HEMOGLOBIN A1C
EAG (MMOL/L): 15.9 (calc)
Hgb A1c MFr Bld: 11.6 % of total Hgb — ABNORMAL HIGH (ref <5.7)
MEAN PLASMA GLUCOSE: 286 (calc)

## 2016-10-08 LAB — TIQ-NTM

## 2016-10-08 NOTE — Progress Notes (Signed)
Her A1c is even worse.  She's heading in the wrong direction.  Follow up in 3 months.

## 2016-10-10 ENCOUNTER — Telehealth (INDEPENDENT_AMBULATORY_CARE_PROVIDER_SITE_OTHER): Payer: Self-pay | Admitting: Orthopaedic Surgery

## 2016-10-10 NOTE — Telephone Encounter (Signed)
If she calls back please advise on her LABS  Her A1c is even worse. She's heading in the wrong direction. Follow up in 3 months.

## 2016-10-10 NOTE — Telephone Encounter (Signed)
Received call from patient advised her of note from Marisue Ivan per request. Asked patient if she needed to speak with Marisue Ivan patient said that's ok and hung up phone. The number to contact patient is [phone]

## 2016-10-10 NOTE — Telephone Encounter (Signed)
Patient called asked if she can get a call back concerning her lab work done on 10/07/16. The number to contact patient is [phone]

## 2016-10-10 NOTE — Telephone Encounter (Signed)
TRIED TO CALL AND SAYS NOT AVAILABLE

## 2016-10-23 ENCOUNTER — Telehealth (INDEPENDENT_AMBULATORY_CARE_PROVIDER_SITE_OTHER): Payer: Self-pay

## 2016-10-23 NOTE — Telephone Encounter (Signed)
Prior Authorization request details: Prior [name]) ID: [id number]  Drug/Service Name: MONOVISC 88 MG/4 ML SYRINGE Patient: Kristin Anthony Date Requested: 10/23/2016 3:05:13 PM  Member ID: [id number] DOB: Sep 15, 1950

## 2016-10-23 NOTE — Telephone Encounter (Signed)
Patient called and would like some pain medicine states Meloxicam does not help at all.   CB [phone]

## 2016-10-24 MED ORDER — TRAMADOL HCL 50 MG PO TABS: 50.0000 mg | ORAL_TABLET | Freq: Two times a day (BID) | ORAL | 0 refills | Status: DC | PRN

## 2016-10-24 NOTE — Addendum Note (Signed)
Addended by: Albertina Parr on: 10/24/2016 12:27 PM   Modules accepted: Orders

## 2016-10-24 NOTE — Telephone Encounter (Signed)
Called Rx into [facility]

## 2016-10-24 NOTE — Telephone Encounter (Signed)
Tramadol #30

## 2016-11-24 ENCOUNTER — Other Ambulatory Visit: Payer: Self-pay | Admitting: Radiology

## 2017-04-27 ENCOUNTER — Other Ambulatory Visit (INDEPENDENT_AMBULATORY_CARE_PROVIDER_SITE_OTHER): Payer: Self-pay

## 2017-04-27 ENCOUNTER — Encounter (INDEPENDENT_AMBULATORY_CARE_PROVIDER_SITE_OTHER): Payer: Self-pay

## 2017-04-27 ENCOUNTER — Ambulatory Visit (INDEPENDENT_AMBULATORY_CARE_PROVIDER_SITE_OTHER): Payer: Managed Care, Other (non HMO)

## 2017-04-27 DIAGNOSIS — M1712 Unilateral primary osteoarthritis, left knee: Secondary | ICD-10-CM

## 2017-04-27 DIAGNOSIS — M1711 Unilateral primary osteoarthritis, right knee: Secondary | ICD-10-CM

## 2017-04-27 NOTE — Progress Notes (Signed)
Patient came in to day to recheck her A1C.

## 2017-04-28 ENCOUNTER — Telehealth (INDEPENDENT_AMBULATORY_CARE_PROVIDER_SITE_OTHER): Payer: Self-pay

## 2017-04-28 LAB — TIQ-NTM

## 2017-04-28 LAB — HEMOGLOBIN A1C
EAG (MMOL/L): 11.7 (calc)
Hgb A1c MFr Bld: 9 % of total Hgb — ABNORMAL HIGH (ref <5.7)
Mean Plasma Glucose: 212 (calc)

## 2017-04-28 NOTE — Progress Notes (Signed)
It's better but not ready for surgery.  It's 9.0.  Needs to be below 8.0.  Recheck in 2 months.

## 2017-04-28 NOTE — Telephone Encounter (Signed)
Tried to call patient no answer could not leave voicemail, mailbox is full. I will try to call her back later.    "It's better but not ready for surgery. It's 9.0. Needs to be below 8.0. Recheck in 2 months. "

## 2017-04-28 NOTE — Telephone Encounter (Signed)
Patient called back and I advised.

## 2017-06-18 ENCOUNTER — Other Ambulatory Visit: Payer: Self-pay | Admitting: Internal Medicine

## 2017-06-18 DIAGNOSIS — R140 Abdominal distension (gaseous): Secondary | ICD-10-CM

## 2017-06-18 DIAGNOSIS — R109 Unspecified abdominal pain: Secondary | ICD-10-CM

## 2017-06-19 ENCOUNTER — Other Ambulatory Visit: Payer: Self-pay

## 2017-06-19 ENCOUNTER — Encounter (HOSPITAL_BASED_OUTPATIENT_CLINIC_OR_DEPARTMENT_OTHER): Payer: Self-pay

## 2017-06-19 ENCOUNTER — Emergency Department (HOSPITAL_BASED_OUTPATIENT_CLINIC_OR_DEPARTMENT_OTHER): Payer: Managed Care, Other (non HMO)

## 2017-06-19 ENCOUNTER — Emergency Department (HOSPITAL_BASED_OUTPATIENT_CLINIC_OR_DEPARTMENT_OTHER)
Admission: EM | Admit: 2017-06-19 | Discharge: 2017-06-19 | Disposition: A | Discharge status: Home / Self Care | Payer: Managed Care, Other (non HMO) | Attending: Emergency Medicine | Admitting: Emergency Medicine

## 2017-06-19 DIAGNOSIS — Z794 Long term (current) use of insulin: Secondary | ICD-10-CM | POA: Diagnosis not present

## 2017-06-19 DIAGNOSIS — R1011 Right upper quadrant pain: Secondary | ICD-10-CM | POA: Diagnosis not present

## 2017-06-19 DIAGNOSIS — Z79899 Other long term (current) drug therapy: Secondary | ICD-10-CM | POA: Diagnosis not present

## 2017-06-19 DIAGNOSIS — E119 Type 2 diabetes mellitus without complications: Secondary | ICD-10-CM | POA: Insufficient documentation

## 2017-06-19 DIAGNOSIS — I10 Essential (primary) hypertension: Secondary | ICD-10-CM | POA: Diagnosis not present

## 2017-06-19 LAB — COMPREHENSIVE METABOLIC PANEL
ALK PHOS: 92 U/L (ref 38–126)
ALT: 20 U/L (ref 14–54)
AST: 24 U/L (ref 15–41)
Albumin: 3.8 g/dL (ref 3.5–5.0)
Anion gap: 7 (ref 5–15)
BILIRUBIN TOTAL: 0.6 mg/dL (ref 0.3–1.2)
BUN: 13 mg/dL (ref 6–20)
CALCIUM: 8.9 mg/dL (ref 8.9–10.3)
CO2: 25 mmol/L (ref 22–32)
Chloride: 106 mmol/L (ref 101–111)
Creatinine, Ser: 0.54 mg/dL (ref 0.44–1.00)
Glucose, Bld: 246 mg/dL — ABNORMAL HIGH (ref 65–99)
Potassium: 3.8 mmol/L (ref 3.5–5.1)
Sodium: 138 mmol/L (ref 135–145)
TOTAL PROTEIN: 7.4 g/dL (ref 6.5–8.1)

## 2017-06-19 LAB — URINALYSIS, MICROSCOPIC (REFLEX)

## 2017-06-19 LAB — CBC WITH DIFFERENTIAL/PLATELET
Basophils Absolute: 0 10*3/uL (ref 0.0–0.1)
Basophils Relative: 0 %
Eosinophils Absolute: 0.2 10*3/uL (ref 0.0–0.7)
Eosinophils Relative: 2 %
HCT: 41.1 % (ref 36.0–46.0)
HEMOGLOBIN: 13.8 g/dL (ref 12.0–15.0)
LYMPHS ABS: 2.5 10*3/uL (ref 0.7–4.0)
LYMPHS PCT: 38 %
MCH: 30.3 pg (ref 26.0–34.0)
MCHC: 33.6 g/dL (ref 30.0–36.0)
MCV: 90.1 fL (ref 78.0–100.0)
MONOS PCT: 8 %
Monocytes Absolute: 0.5 10*3/uL (ref 0.1–1.0)
NEUTROS PCT: 52 %
Neutro Abs: 3.4 10*3/uL (ref 1.7–7.7)
Platelets: 184 10*3/uL (ref 150–400)
RBC: 4.56 MIL/uL (ref 3.87–5.11)
RDW: 13.1 % (ref 11.5–15.5)
WBC: 6.6 10*3/uL (ref 4.0–10.5)

## 2017-06-19 LAB — URINALYSIS, ROUTINE W REFLEX MICROSCOPIC
BILIRUBIN URINE: NEGATIVE
Glucose, UA: 250 mg/dL — AB
HGB URINE DIPSTICK: NEGATIVE
Ketones, ur: NEGATIVE mg/dL
Leukocytes, UA: NEGATIVE
Nitrite: NEGATIVE
PROTEIN: 30 mg/dL — AB
SPECIFIC GRAVITY, URINE: 1.02 (ref 1.005–1.030)
pH: 6.5 (ref 5.0–8.0)

## 2017-06-19 LAB — LIPASE, BLOOD: LIPASE: 30 U/L (ref 11–51)

## 2017-06-19 MED ORDER — SODIUM CHLORIDE 0.9 % IV BOLUS
500.0000 mL | Freq: Once | INTRAVENOUS | Status: AC
  Administered 2017-06-19: 500 mL via INTRAVENOUS

## 2017-06-19 MED ORDER — IOPAMIDOL (ISOVUE-300) INJECTION 61%
100.0000 mL | Freq: Once | INTRAVENOUS | Status: AC | PRN
  Administered 2017-06-19: 100 mL via INTRAVENOUS

## 2017-06-19 NOTE — ED Notes (Signed)
Pt ambulated to CT

## 2017-06-19 NOTE — ED Notes (Signed)
Pt returned from US

## 2017-06-19 NOTE — ED Notes (Signed)
ED Provider at bedside discussing results with patient at this time. 

## 2017-06-19 NOTE — ED Notes (Signed)
EKG delayed due to patient in radiology at this time.

## 2017-06-19 NOTE — Discharge Instructions (Addendum)
The cause of your pain was not identified today.  You can take tylenol or ibuprofen, available over the counter according to label instructions for pain.   You had a CT scan of your abdomen performed today that showed a nodule on your right adrenal gland - this will need followed up by your family doctor with repeat imaging within a year.   Get rechecked immediately if you have any new or concerning symptoms.

## 2017-06-19 NOTE — ED Notes (Signed)
ED Provider at bedside. 

## 2017-06-19 NOTE — ED Triage Notes (Signed)
Pt reports RUQ pain radiating down right side of abdomen.6 months. Also reports radiation into back. Pt sts some nausea at times. Sts pain has been getting worse.

## 2017-06-19 NOTE — ED Notes (Signed)
Patient ambulated to US

## 2017-06-19 NOTE — ED Provider Notes (Signed)
[facility] EMERGENCY DEPARTMENT Provider Note   CSN: [id number] Arrival date & time: 06/19/17  0740     History   Chief Complaint Chief Complaint  Patient presents with  . Abdominal Pain    HPI Kristin Anthony is a 67 y.o. female.  The history is provided by the patient. No language interpreter was used.  Abdominal Pain      Kristin Anthony is a 67 y.o. female who presents to the Emergency Department complaining of abdominal pain. She presents to the emergency department for evaluation of right upper quadrant abdominal pain. Her pain began about six months ago and is located in her right upper quadrant right flank. The pain at times radiates down to her right mid and lower abdomen. Pain is waxing and waning and worse when she lays on her right side as well as about 30 minutes after meals. Over the last six months her pain has progressively worsened. She does have occasional nausea. She denies any fevers, chest pain, shortness of breath, vomiting, diarrhea, dysuria. She does have a history of appendectomy and hysterectomy, diabetes, hypertension, hyperlipidemia.  Past Medical History:  Diagnosis Date  . Arthritis   . Diabetes mellitus   . DVT (deep venous thrombosis) (HCC)   . High cholesterol   . Hypertension     Patient Active Problem List   Diagnosis Date Noted  . Unilateral primary osteoarthritis, left knee 10/07/2016  . Chronic pain of both knees 10/07/2016  . Unilateral primary osteoarthritis, right knee 03/18/2016  . Closed fracture of tuft of distal phalanx of right thumb 08/31/2012    Past Surgical History:  Procedure Laterality Date  . ABDOMINAL HYSTERECTOMY    . APPENDECTOMY    . BACK SURGERY    . KNEE SURGERY       OB History   None      Home Medications    Prior to Admission medications   Medication Sig Start Date End Date Taking? Authorizing Provider  amLODipine-benazepril (LOTREL) 10-20 MG per capsule Take 1 capsule by mouth daily.       [provider]  aspirin 81 MG tablet Take 81 mg by mouth daily.    [provider]  B-D INS SYRINGE 0.5CC/31GX5/16 31G X 5/16" 0.5 ML MISC  06/09/12   [provider]  escitalopram (LEXAPRO) 10 MG tablet Take 10 mg by mouth daily.      [provider]  GAVILYTE-N WITH FLAVOR PACK 420 g solution  02/29/16   [provider]  insulin glargine (LANTUS) 100 UNIT/ML injection Inject 32 Units into the skin at bedtime.     [provider]  JANUVIA 100 MG tablet  03/13/16   [provider]  losartan (COZAAR) 25 MG tablet Take 25 mg by mouth daily.    [provider]  meloxicam (MOBIC) 7.5 MG tablet Take 1 tablet (7.5 mg total) by mouth daily. Patient not taking: Reported on 06/10/2016 09/09/15   Ward, Chase Picket, PA-C  methocarbamol (ROBAXIN) 500 MG tablet Take 1 tablet (500 mg total) by mouth 2 (two) times daily. Patient not taking: Reported on 06/10/2016 10/20/15   Derwood Kaplan, MD  rivaroxaban (XARELTO) 10 MG TABS tablet Take 10 mg by mouth daily.    [provider]  simvastatin (ZOCOR) 10 MG tablet Take 10 mg by mouth at bedtime.      [provider]  sitaGLIPtin-metformin (JANUMET) 50-1000 MG tablet Take 1 tablet by mouth 2 (two) times daily with  a meal.    [provider]  TOUJEO SOLOSTAR 300 UNIT/ML SOPN  11/21/15   [provider]  traMADol (ULTRAM) 50 MG tablet Take 1 tablet (50 mg total) by mouth 2 (two) times daily as needed. 10/24/16   Tarry Kos, MD    Family History Family History  Problem Relation Age of Onset  . Heart attack Mother   . Diabetes Father   . Hypertension Father   . Hypertension Sister   . Diabetes Sister   . Hypertension Brother   . Diabetes Brother   . Hyperlipidemia Neg Hx   . Sudden death Neg Hx     Social History Social History   Tobacco Use  . Smoking status: Never Smoker  . Smokeless tobacco: Never Used  Substance Use Topics  . Alcohol  use: No  . Drug use: No     Allergies   Codeine   Review of Systems Review of Systems  Gastrointestinal: Positive for abdominal pain.  All other systems reviewed and are negative.    Physical Exam Updated Vital Signs BP 130/68 (BP Location: Left Arm)   Pulse 80   Temp 98.5 F (36.9 C) (Oral)   Resp 18   Ht 5\' 7"  (1.702 m)   Wt 86.2 kg (190 lb)   SpO2 98%   BMI 29.76 kg/m   Physical Exam  Constitutional: She is oriented to person, place, and time. She appears well-developed and well-nourished.  HENT:  Head: Normocephalic and atraumatic.  Cardiovascular: Normal rate and regular rhythm.  No murmur heard. Pulmonary/Chest: Effort normal and breath sounds normal. No respiratory distress.  Abdominal: Soft. There is no rebound and no guarding.  Mild right upper quadrant tenderness, negative Murphy's.  Musculoskeletal: She exhibits edema. She exhibits no tenderness.  1+ pitting edema to BLE  Neurological: She is alert and oriented to person, place, and time.  Skin: Skin is warm and dry.  Psychiatric: She has a normal mood and affect. Her behavior is normal.  Nursing note and vitals reviewed.    ED Treatments / Results  Labs (all labs ordered are listed, but only abnormal results are displayed) Labs Reviewed  COMPREHENSIVE METABOLIC PANEL - Abnormal; Notable for the following components:      Result Value   Glucose, Bld 246 (*)    All other components within normal limits  URINALYSIS, ROUTINE W REFLEX MICROSCOPIC - Abnormal; Notable for the following components:   Glucose, UA 250 (*)    Protein, ur 30 (*)    All other components within normal limits  URINALYSIS, MICROSCOPIC (REFLEX) - Abnormal; Notable for the following components:   Bacteria, UA FEW (*)    All other components within normal limits  CBC WITH DIFFERENTIAL/PLATELET  LIPASE, BLOOD    EKG EKG Interpretation  Date/Time:  Friday June 19 2017 08:47:35 EDT Ventricular Rate:  82 PR Interval:      QRS Duration: 97 QT Interval:  399 QTC Calculation: 466 R Axis:   29 Text Interpretation:  Sinus rhythm baseline wander in leads I, II, III, aVR, aVL, aVF Confirmed by Tilden Fossa [phone]) on 06/19/2017 9:06:08 AM Also confirmed by Tilden Fossa [phone]), editor Sheppard Evens ([id number])  on 06/19/2017 2:40:18 PM   Radiology Ct Abdomen Pelvis W Contrast  Result Date: 06/19/2017 CLINICAL DATA:  Upper abdominal pain and nausea EXAM: CT ABDOMEN AND PELVIS WITH CONTRAST TECHNIQUE: Multidetector CT imaging of the abdomen and pelvis was performed using the standard protocol following bolus administration of intravenous contrast.  CONTRAST:  ISOVUE-300 IOPAMIDOL (ISOVUE-300) INJECTION 61% COMPARISON:  None. FINDINGS: Lower chest: There is patchy lower lobe atelectatic change. There is no lung base edema or consolidation evident. Hepatobiliary: Liver measures 19.5 cm in length. No focal liver lesions are evident. Gallbladder wall is not appreciably thickened. There is no biliary duct dilatation. Pancreas: There is no pancreatic mass or inflammatory focus. Spleen: No splenic lesions are evident. Adrenals/Urinary Tract: Left adrenal appears unremarkable. There is a right adrenal mass measuring 1.6 x 1.1 cm which has attenuation value placing this lesion in an indeterminate category. Kidneys bilaterally show no evident mass or hydronephrosis on either side. There is no evident renal or ureteral calculus on either side. Urinary bladder is midline with wall thickness within normal limits. Stomach/Bowel: There is no appreciable bowel wall or mesenteric thickening. There is moderate stool in the colon. There is no evident bowel obstruction. No free air or portal venous air. Vascular/Lymphatic: There is atherosclerotic calcification in the aorta and common iliac arteries. No evident aneurysm. Major mesenteric vessels appear patent. There is no appreciable adenopathy in the abdomen or pelvis. Reproductive: Uterus is  absent.  There is no evident pelvic mass. Other: Appendix is absent. No abscess or ascites is appreciable in the abdomen or pelvis. Musculoskeletal: There is postoperative change in the lower lumbar spine at L4 and L5. There is osteoarthritic change throughout much of the lumbar spine. There are no blastic or lytic bone lesions. There is no intramuscular or abdominal wall lesion. IMPRESSION: 1. No bowel obstruction or bowel wall thickening evident. No abscess. Appendix absent. 2.  No renal or ureteral calculus.  No hydronephrosis. 3. Small right adrenal mass measuring 1.6 x 1.1 cm, considered indeterminate. This finding may warrant a follow-up study in 1 year to assess for stability. 4.  Prominent liver without focal liver lesion. 5.  Aortoiliac atherosclerosis. 6.  Postoperative change lower lumbar spine. 7.  Small hiatal hernia. Aortic Atherosclerosis (ICD10-I70.0). Electronically Signed   By: Bretta Bang III M.D.   On: 06/19/2017 10:26   US Abdomen Limited Ruq  Result Date: 06/19/2017 CLINICAL DATA:  Right upper quadrant pain EXAM: ULTRASOUND ABDOMEN LIMITED RIGHT UPPER QUADRANT COMPARISON:  07/18/2013 FINDINGS: Gallbladder: No gallstones or wall thickening visualized. No sonographic Murphy sign noted by sonographer. Common bile duct: Diameter: Normal caliber, 6 mm Liver: Increased echotexture compatible with fatty infiltration. No focal abnormality or biliary ductal dilatation. Portal vein is patent on color Doppler imaging with normal direction of blood flow towards the liver. IMPRESSION: Fatty infiltration of the liver. No acute findings. Electronically Signed   By: Charlett Nose M.D.   On: 06/19/2017 09:00    Procedures Procedures (including critical care time)  Medications Ordered in ED Medications  sodium chloride 0.9 % bolus 500 mL (0 mLs Intravenous Stopped 06/19/17 0955)  iopamidol (ISOVUE-300) 61 % injection 100 mL (100 mLs Intravenous Contrast Given 06/19/17 0958)     Initial  Impression / Assessment and Plan / ED Course  I have reviewed the triage vital signs and the nursing notes.  Pertinent labs & imaging results that were available during my care of the patient were reviewed by me and considered in my medical decision making (see chart for details).     Patient here for evaluation of several months of right sided abdominal pain. She does have mild tenderness on examination without peritoneal findings. Labs with mild hyperglycemia, no additional abnormalities. Right upper quadrant ultrasound with fatty liver, no evidence of cholecystitis. CT abdomen obtained  with no evidence of appendicitis, diverticulitis, ureteral stone. CT does demonstrate incidental finding of adrenal mass. Discussed with patient these findings. Do not feel that adrenal masses related to her symptoms. Discussed importance of PCP follow-up for MSK abdominal wall pain as well as return precautions. Final Clinical Impressions(s) / ED Diagnoses   Final diagnoses:  RUQ pain  Right upper quadrant abdominal pain    ED Discharge Orders    None       Tilden Fossaees, Chelci [place], MD 06/19/17 1455

## 2017-06-20 ENCOUNTER — Other Ambulatory Visit (HOSPITAL_BASED_OUTPATIENT_CLINIC_OR_DEPARTMENT_OTHER): Payer: Managed Care, Other (non HMO)

## 2017-06-23 ENCOUNTER — Ambulatory Visit (INDEPENDENT_AMBULATORY_CARE_PROVIDER_SITE_OTHER): Payer: Managed Care, Other (non HMO)

## 2017-06-23 ENCOUNTER — Ambulatory Visit (INDEPENDENT_AMBULATORY_CARE_PROVIDER_SITE_OTHER): Payer: Managed Care, Other (non HMO) | Admitting: Orthopaedic Surgery

## 2017-06-23 ENCOUNTER — Encounter (INDEPENDENT_AMBULATORY_CARE_PROVIDER_SITE_OTHER): Payer: Self-pay | Admitting: Orthopaedic Surgery

## 2017-06-23 DIAGNOSIS — G8929 Other chronic pain: Secondary | ICD-10-CM

## 2017-06-23 DIAGNOSIS — M25561 Pain in right knee: Secondary | ICD-10-CM | POA: Diagnosis not present

## 2017-06-23 DIAGNOSIS — M1711 Unilateral primary osteoarthritis, right knee: Secondary | ICD-10-CM

## 2017-06-23 DIAGNOSIS — M1712 Unilateral primary osteoarthritis, left knee: Secondary | ICD-10-CM | POA: Diagnosis not present

## 2017-06-23 DIAGNOSIS — M25562 Pain in left knee: Secondary | ICD-10-CM

## 2017-06-23 NOTE — Progress Notes (Signed)
Office Visit Note   Patient: Kristin Anthony           Date of Birth: 07/05/1950           MRN: [id number] Visit Date: 06/23/2017              Requested by: Creola Corn, MD [address] [place], [place] [address] PCP: Creola Corn, MD   Assessment & Plan: Visit Diagnoses:  1. Unilateral primary osteoarthritis, right knee   2. Chronic pain of both knees   3. Unilateral primary osteoarthritis, left knee     Plan: Impression is right greater than left knee degenerative joint disease.  X-rays were reviewed with the patient which does show significant joint space narrowing of the medial compartment of the right knee.  Patient is now failed conservative treatment and wishes to pursue total knee replacement.  We had a long discussion regarding the risks and benefits and expected postoperative recovery.  Plan is to place her on Xarelto postoperatively given her history of a DVT.  We will also plan on sending her home with home health physical therapy.  We discussed the importance of postoperative physical therapy.  She understands and wishes to proceed.  We will get her scheduled in the near future.  Follow-Up Instructions: Return if symptoms worsen or fail to improve.   Orders:  Orders Placed This Encounter  Procedures  . XR KNEE 3 VIEW LEFT  . XR KNEE 3 VIEW RIGHT   No orders of the defined types were placed in this encounter.     Procedures: No procedures performed   Clinical Data: No additional findings.   Subjective: Chief Complaint  Patient presents with  . Right Knee - Pain  . Left Knee - Pain    Kristin Anthony follows up today for continued bilateral knee pain worse than the right.  Her diabetes is better controlled now.  Her last hemoglobin A1c was 7.7.  She is ready to have a right knee replacement.  Denies any numbness and tingling denies any hip pain denies any back pain.   Review of Systems  Constitutional: Negative.   HENT: Negative.   Eyes: Negative.     Respiratory: Negative.   Cardiovascular: Negative.   Endocrine: Negative.   Musculoskeletal: Negative.   Neurological: Negative.   Hematological: Negative.   Psychiatric/Behavioral: Negative.   All other systems reviewed and are negative.    Objective: Vital Signs: There were no vitals taken for this visit.  Physical Exam  Constitutional: She is oriented to person, place, and time. She appears well-developed and well-nourished.  Pulmonary/Chest: Effort normal.  Neurological: She is alert and oriented to person, place, and time.  Skin: Skin is warm. Capillary refill takes less than 2 seconds.  Psychiatric: She has a normal mood and affect. Her behavior is normal. Judgment and thought content normal.  Nursing note and vitals reviewed.   Ortho Exam Bilateral knee exam shows no joint effusion.  Collaterals and cruciates are stable.  Well-preserved joint range of motion Specialty Comments:  No specialty comments available.  Imaging: Xr Knee 3 View Left  Result Date: 06/23/2017 Moderate osteoarthritis and chondrocalcinosis  Xr Knee 3 View Right  Result Date: 06/23/2017 Significant joint space narrowing consistent with degenerative joint disease    PMFS History: Patient Active Problem List   Diagnosis Date Noted  . Unilateral primary osteoarthritis, left knee 10/07/2016  . Chronic pain of both knees 10/07/2016  . Unilateral primary osteoarthritis, right knee 03/18/2016  . Closed  fracture of tuft of distal phalanx of right thumb 08/31/2012   Past Medical History:  Diagnosis Date  . Arthritis   . Diabetes mellitus   . DVT (deep venous thrombosis) (HCC)   . High cholesterol   . Hypertension     Family History  Problem Relation Age of Onset  . Heart attack Mother   . Diabetes Father   . Hypertension Father   . Hypertension Sister   . Diabetes Sister   . Hypertension Brother   . Diabetes Brother   . Hyperlipidemia Neg Hx   . Sudden death Neg Hx     Past  Surgical History:  Procedure Laterality Date  . ABDOMINAL HYSTERECTOMY    . APPENDECTOMY    . BACK SURGERY    . KNEE SURGERY     Social History   Occupational History  . Not on file  Tobacco Use  . Smoking status: Never Smoker  . Smokeless tobacco: Never Used  Substance and Sexual Activity  . Alcohol use: No  . Drug use: No  . Sexual activity: Not on file

## 2017-06-25 ENCOUNTER — Other Ambulatory Visit (INDEPENDENT_AMBULATORY_CARE_PROVIDER_SITE_OTHER): Payer: Self-pay

## 2017-06-25 ENCOUNTER — Other Ambulatory Visit: Payer: Managed Care, Other (non HMO)

## 2017-07-01 NOTE — Pre-Procedure Instructions (Signed)
Kristin AndersonDianne M Anthony  07/01/2017      [facility] [address] [place], [place] - [address] [place] [address] Phone: [phone] Fax: [phone]  [name] [place], [place] - [name] [place] [address] Phone: [phone] Fax: [phone]    Your procedure is scheduled on July 1  [facility] Ad[facility] at 1030 A.M.  Call this number if you have problems the morning of surgery:  [phone]   Remember:  NOTHING TO EAT OR DRINK AFTER MIDNIGHT     Take these medicines the morning of surgery with A SIP OF WATER  escitalopram (LEXAPRO)  7 days prior to surgery STOP taking any Aspirin(unless otherwise instructed by your surgeon), Aleve, Naproxen, Ibuprofen, Motrin, Advil, Goody's, BC's, all herbal medications, fish oil, and all vitamins  Follow your doctors instructions regarding your Aspirin.  If no instructions were given by your doctor, then you will need to call the prescribing office office to get instructions.     WHAT DO I DO ABOUT MY DIABETES MEDICATION?   Do not take oral diabetes medicines (pills) the morning of surgery. JANUVIA   . THE NIGHT BEFORE SURGERY, take _____16______ units of __TOUJEO_________insulin.       . THE MORNING OF SURGERY, take ______16_______ units of __TOUJEO________insulin.  . The day of surgery, do not take other diabetes injectables, including Byetta (exenatide), Bydureon (exenatide ER), Victoza (liraglutide), or Trulicity (dulaglutide).  . If your CBG is greater than 220 mg/dL, you may take  of your sliding scale (correction) dose of insulin.  insulin lispro (HUMALOG KWIKPEN) the morning of surgery   How to Manage Your Diabetes Before and After Surgery  Why is it important to control my blood sugar before and after surgery? . Improving blood sugar levels before and after surgery helps healing and  can limit problems. . A way of improving blood sugar control is eating a healthy diet by: o  Eating less sugar and carbohydrates o  Increasing activity/exercise o  Talking with your doctor about reaching your blood sugar goals . High blood sugars (greater than 180 mg/dL) can raise your risk of infections and slow your recovery, so you will need to focus on controlling your diabetes during the weeks before surgery. . Make sure that the doctor who takes care of your diabetes knows about your planned surgery including the date and location.  How do I manage my blood sugar before surgery? . Check your blood sugar at least 4 times a day, starting 2 days before surgery, to make sure that the level is not too high or low. o Check your blood sugar the morning of your surgery when you wake up and every 2 hours until you get to the Short Stay unit. . If your blood sugar is less than 70 mg/dL, you will need to treat for low blood sugar: o Do not take insulin. o Treat a low blood sugar (less than 70 mg/dL) with  cup of clear juice (cranberry or apple), 4 glucose tablets, OR glucose gel. o Recheck blood sugar in 15 minutes after treatment (to make sure it is greater than 70 mg/dL). If your blood sugar is not greater than 70 mg/dL on recheck, call [phone] for further instructions. . Report your blood sugar to the short stay nurse when you get to Short Stay.  . If you  are admitted to the hospital after surgery: o Your blood sugar will be checked by the staff and you will probably be given insulin after surgery (instead of oral diabetes medicines) to make sure you have good blood sugar levels. o The goal for blood sugar control after surgery is 80-180 mg/dL.    Do not wear jewelry, make-up or nail polish.  Do not wear lotions, powders, or perfumes, or deodorant.  Do not shave 48 hours prior to surgery.  Men may shave face and neck.  Do not bring valuables to the hospital.  [facility] is not  responsible for any belongings or valuables.  Contacts, dentures or bridgework may not be worn into surgery.  Leave your suitcase in the car.  After surgery it may be brought to your room.  For patients admitted to the hospital, discharge time will be determined by your treatment team.  Patients discharged the day of surgery will not be allowed to drive home.    Special instructions:   [name]- Preparing For Surgery  Before surgery, you can play an important role. Because skin is not sterile, your skin needs to be as free of germs as possible. You can reduce the number of germs on your skin by washing with CHG (chlorahexidine gluconate) Soap before surgery.  CHG is an antiseptic cleaner which kills germs and bonds with the skin to continue killing germs even after washing.    Oral Hygiene is also important to reduce your risk of infection.  Remember - BRUSH YOUR TEETH THE MORNING OF SURGERY WITH YOUR REGULAR TOOTHPASTE  Please do not use if you have an allergy to CHG or antibacterial soaps. If your skin becomes reddened/irritated stop using the CHG.  Do not shave (including legs and underarms) for at least 48 hours prior to first CHG shower. It is OK to shave your face.  Please follow these instructions carefully.   1. Shower the NIGHT BEFORE SURGERY and the MORNING OF SURGERY with CHG.   2. If you chose to wash your hair, wash your hair first as usual with your normal shampoo.  3. After you shampoo, rinse your hair and body thoroughly to remove the shampoo.  4. Use CHG as you would any other liquid soap. You can apply CHG directly to the skin and wash gently with a scrungie or a clean washcloth.   5. Apply the CHG Soap to your body ONLY FROM THE NECK DOWN.  Do not use on open wounds or open sores. Avoid contact with your eyes, ears, mouth and genitals (private parts). Wash Face and genitals (private parts)  with your normal soap.  6. Wash thoroughly, paying special attention to  the area where your surgery will be performed.  7. Thoroughly rinse your body with warm water from the neck down.  8. DO NOT shower/wash with your normal soap after using and rinsing off the CHG Soap.  9. Pat yourself dry with a CLEAN TOWEL.  10. Wear CLEAN PAJAMAS to bed the night before surgery, wear comfortable clothes the morning of surgery  11. Place CLEAN SHEETS on your bed the night of your first shower and DO NOT SLEEP WITH PETS.    Day of Surgery:  Do not apply any deodorants/lotions.  Please wear clean clothes to the hospital/surgery center.   Remember to brush your teeth WITH YOUR REGULAR TOOTHPASTE.    Please read over the following fact sheets that you were given.

## 2017-07-02 ENCOUNTER — Ambulatory Visit (HOSPITAL_COMMUNITY)
Admission: RE | Admit: 2017-07-02 | Discharge: 2017-07-02 | Disposition: A | Discharge status: Home / Self Care | Payer: Managed Care, Other (non HMO) | Source: Ambulatory Visit | Attending: Physician Assistant | Admitting: Physician Assistant

## 2017-07-02 ENCOUNTER — Other Ambulatory Visit: Payer: Self-pay

## 2017-07-02 ENCOUNTER — Encounter (HOSPITAL_COMMUNITY): Payer: Self-pay

## 2017-07-02 ENCOUNTER — Encounter (HOSPITAL_COMMUNITY)
Admission: RE | Admit: 2017-07-02 | Discharge: 2017-07-02 | Disposition: A | Discharge status: Home / Self Care | Payer: Managed Care, Other (non HMO) | Source: Ambulatory Visit | Attending: Orthopaedic Surgery | Admitting: Orthopaedic Surgery

## 2017-07-02 DIAGNOSIS — M1711 Unilateral primary osteoarthritis, right knee: Secondary | ICD-10-CM | POA: Diagnosis present

## 2017-07-02 DIAGNOSIS — Z01818 Encounter for other preprocedural examination: Secondary | ICD-10-CM | POA: Diagnosis not present

## 2017-07-02 HISTORY — DX: Personal history of other diseases of the digestive system: Z87.19

## 2017-07-02 HISTORY — DX: Depression, unspecified: F32.A

## 2017-07-02 HISTORY — DX: Major depressive disorder, single episode, unspecified: F32.9

## 2017-07-02 LAB — COMPREHENSIVE METABOLIC PANEL
ALBUMIN: 3.7 g/dL (ref 3.5–5.0)
ALT: 20 U/L (ref 14–54)
ANION GAP: 8 (ref 5–15)
AST: 25 U/L (ref 15–41)
Alkaline Phosphatase: 111 U/L (ref 38–126)
BUN: 7 mg/dL (ref 6–20)
CHLORIDE: 105 mmol/L (ref 101–111)
CO2: 23 mmol/L (ref 22–32)
Calcium: 9.2 mg/dL (ref 8.9–10.3)
Creatinine, Ser: 0.63 mg/dL (ref 0.44–1.00)
GFR calc Af Amer: >60 mL/min (ref >60)
GFR calc non Af Amer: >60 mL/min (ref >60)
GLUCOSE: 279 mg/dL — AB (ref 65–99)
POTASSIUM: 4.3 mmol/L (ref 3.5–5.1)
Sodium: 136 mmol/L (ref 135–145)
TOTAL PROTEIN: 6.9 g/dL (ref 6.5–8.1)
Total Bilirubin: 0.7 mg/dL (ref 0.3–1.2)

## 2017-07-02 LAB — CBC WITH DIFFERENTIAL/PLATELET
Abs Immature Granulocytes: 0 10*3/uL (ref 0.0–0.1)
BASOS ABS: 0.1 10*3/uL (ref 0.0–0.1)
Basophils Relative: 1 %
EOS ABS: 0.2 10*3/uL (ref 0.0–0.7)
EOS PCT: 3 %
HCT: 42.3 % (ref 36.0–46.0)
Hemoglobin: 13.6 g/dL (ref 12.0–15.0)
IMMATURE GRANULOCYTES: 1 %
LYMPHS ABS: 2.9 10*3/uL (ref 0.7–4.0)
Lymphocytes Relative: 41 %
MCH: 29.5 pg (ref 26.0–34.0)
MCHC: 32.2 g/dL (ref 30.0–36.0)
MCV: 91.8 fL (ref 78.0–100.0)
Monocytes Absolute: 0.5 10*3/uL (ref 0.1–1.0)
Monocytes Relative: 7 %
NEUTROS PCT: 47 %
Neutro Abs: 3.2 10*3/uL (ref 1.7–7.7)
PLATELETS: 179 10*3/uL (ref 150–400)
RBC: 4.61 MIL/uL (ref 3.87–5.11)
RDW: 12.8 % (ref 11.5–15.5)
WBC: 6.9 10*3/uL (ref 4.0–10.5)

## 2017-07-02 LAB — APTT: APTT: 29 s (ref 24–36)

## 2017-07-02 LAB — TYPE AND SCREEN
ABO/RH(D): O POS
ANTIBODY SCREEN: NEGATIVE

## 2017-07-02 LAB — GLUCOSE, CAPILLARY: GLUCOSE-CAPILLARY: 295 mg/dL — AB (ref 65–99)

## 2017-07-02 LAB — SURGICAL PCR SCREEN
MRSA, PCR: NEGATIVE
STAPHYLOCOCCUS AUREUS: NEGATIVE

## 2017-07-02 LAB — PROTIME-INR
INR: 1.06
PROTHROMBIN TIME: 13.7 s (ref 11.4–15.2)

## 2017-07-02 NOTE — Progress Notes (Signed)
PCP - Dr. Creola CornJohn Russo Cardiologist - patient denies  Chest x-ray - 07/02/2017 EKG -06/23/2017  Stress Test - 2017 ECHO - patient denies Cardiac Cath - patient denies  Sleep Study - patient denies  Fasting Blood Sugar - 179 Checks Blood Sugar 4 times a day  Aspirin Instructions: ASA; told to stop ASA 7 days prior to surgery. Pt does not take as a blood thinner.   Anesthesia review: Yes  Patient denies shortness of breath, fever, cough and chest pain at PAT appointment   Patient verbalized understanding of instructions that were given to them at the PAT appointment. Patient was also instructed that they will need to review over the PAT instructions again at home before surgery.  Viviano SimasMorgan N Tulsi Crossett, RN

## 2017-07-07 NOTE — Progress Notes (Signed)
Anesthesia Chart Review:   Case:  [id number] Date/Time:  07/13/17 1215   Procedure:  RIGHT TOTAL KNEE ARTHROPLASTY (Right Knee)   Anesthesia type:  Spinal   Pre-op diagnosis:  right knee degenerative joint disease   Location:  [facility] ROOM 04 / [facility] OR   Surgeon:  Tarry KosXu, Naiping M, MD      DISCUSSION: - Pt is a 67 year old female with hx HTN, DM, DVT  - Glucose high at pre-admission testing (279). Trenda MootsMary Stanberry, PA has reviewed labs.  Dr. Warren DanesXu's note 06/23/17 documents he knows A1c was 7.7 06/15/17 at PCP's office   VS: BP (!) 160/75   Pulse 97   Temp 36.7 C   Resp 18   Ht 5\' 6"  (1.676 m)   Wt 209 lb (94.8 kg)   SpO2 100%   BMI 33.73 kg/m    PROVIDERS: - PCP is Creola Cornusso, John, MD. Last office visit 06/15/17 with Lydia GuilesBrittany Turbeville, NP   LABS: - glucose 279.  By notes from PCP's office, HbA1c was 7.7 on 06/15/17.   (all labs ordered are listed, but only abnormal results are displayed)  Labs Reviewed  GLUCOSE, CAPILLARY - Abnormal; Notable for the following components:      Result Value   Glucose-Capillary 295 (*)    All other components within normal limits  COMPREHENSIVE METABOLIC PANEL - Abnormal; Notable for the following components:   Glucose, Bld 279 (*)    All other components within normal limits  SURGICAL PCR SCREEN  APTT  CBC WITH DIFFERENTIAL/PLATELET  PROTIME-INR  TYPE AND SCREEN     IMAGES:  CXR 07/02/17: No active cardiopulmonary disease.   EKG 06/19/17: Sinus rhythm. baseline wander in leads I, II, III, aVR, aVL, aVF   Past Medical History:  Diagnosis Date  . Arthritis   . Depression   . Diabetes mellitus   . DVT (deep venous thrombosis) (HCC)   . High cholesterol   . History of hiatal hernia   . Hypertension     Past Surgical History:  Procedure Laterality Date  . ABDOMINAL HYSTERECTOMY    . APPENDECTOMY    . BACK SURGERY    . KNEE SURGERY      MEDICATIONS: . amLODipine (NORVASC) 10 MG tablet  . aspirin EC 81 MG tablet  . atorvastatin  (LIPITOR) 40 MG tablet  . escitalopram (LEXAPRO) 20 MG tablet  . ibuprofen (ADVIL,MOTRIN) 200 MG tablet  . insulin lispro (HUMALOG KWIKPEN) 100 UNIT/ML KiwkPen  . JANUVIA 100 MG tablet  . olmesartan (BENICAR) 40 MG tablet  . TOUJEO SOLOSTAR 300 UNIT/ML SOPN   No current facility-administered medications for this encounter.     If glucose acceptable day of surgery, I anticipate pt can proceed with surgery as scheduled.  Rica Mastngela Emon Lance, FNP-BC [facility] Phone: [phone] 07/07/2017 11:58 AM

## 2017-07-10 MED ORDER — TRANEXAMIC ACID 1000 MG/10ML IV SOLN
2000.0000 mg | INTRAVENOUS | Status: AC
  Administered 2017-07-13: 2000 mg via TOPICAL
  Filled 2017-07-10: qty 20

## 2017-07-11 MED ORDER — TRANEXAMIC ACID 1000 MG/10ML IV SOLN
1000.0000 mg | INTRAVENOUS | Status: DC
  Filled 2017-07-11: qty 10

## 2017-07-13 ENCOUNTER — Encounter (HOSPITAL_COMMUNITY)
Admission: RE | Disposition: A | Discharge status: Home / Self Care | Payer: Self-pay | Source: Ambulatory Visit | Attending: Orthopaedic Surgery

## 2017-07-13 ENCOUNTER — Encounter (HOSPITAL_COMMUNITY): Payer: Self-pay | Admitting: Certified Registered Nurse Anesthetist

## 2017-07-13 ENCOUNTER — Inpatient Hospital Stay (HOSPITAL_COMMUNITY): Payer: Managed Care, Other (non HMO) | Admitting: Emergency Medicine

## 2017-07-13 ENCOUNTER — Inpatient Hospital Stay (HOSPITAL_COMMUNITY): Payer: Managed Care, Other (non HMO)

## 2017-07-13 ENCOUNTER — Inpatient Hospital Stay (HOSPITAL_COMMUNITY): Payer: Managed Care, Other (non HMO) | Admitting: Certified Registered Nurse Anesthetist

## 2017-07-13 ENCOUNTER — Other Ambulatory Visit: Payer: Self-pay

## 2017-07-13 ENCOUNTER — Inpatient Hospital Stay (HOSPITAL_COMMUNITY)
Admission: RE | Admit: 2017-07-13 | Discharge: 2017-07-15 | DRG: 470 | Disposition: A | Discharge status: Home / Self Care | Payer: Managed Care, Other (non HMO) | Source: Ambulatory Visit | Attending: Orthopaedic Surgery | Admitting: Orthopaedic Surgery

## 2017-07-13 DIAGNOSIS — Z86718 Personal history of other venous thrombosis and embolism: Secondary | ICD-10-CM

## 2017-07-13 DIAGNOSIS — E119 Type 2 diabetes mellitus without complications: Secondary | ICD-10-CM | POA: Diagnosis present

## 2017-07-13 DIAGNOSIS — Z885 Allergy status to narcotic agent status: Secondary | ICD-10-CM

## 2017-07-13 DIAGNOSIS — Z96659 Presence of unspecified artificial knee joint: Secondary | ICD-10-CM

## 2017-07-13 DIAGNOSIS — E7800 Pure hypercholesterolemia, unspecified: Secondary | ICD-10-CM | POA: Diagnosis present

## 2017-07-13 DIAGNOSIS — Z79899 Other long term (current) drug therapy: Secondary | ICD-10-CM | POA: Diagnosis not present

## 2017-07-13 DIAGNOSIS — Z794 Long term (current) use of insulin: Secondary | ICD-10-CM

## 2017-07-13 DIAGNOSIS — M1711 Unilateral primary osteoarthritis, right knee: Principal | ICD-10-CM | POA: Diagnosis present

## 2017-07-13 DIAGNOSIS — Z7982 Long term (current) use of aspirin: Secondary | ICD-10-CM | POA: Diagnosis not present

## 2017-07-13 DIAGNOSIS — F329 Major depressive disorder, single episode, unspecified: Secondary | ICD-10-CM | POA: Diagnosis present

## 2017-07-13 DIAGNOSIS — I10 Essential (primary) hypertension: Secondary | ICD-10-CM | POA: Diagnosis present

## 2017-07-13 HISTORY — PX: TOTAL KNEE ARTHROPLASTY: SHX125

## 2017-07-13 LAB — GLUCOSE, CAPILLARY
GLUCOSE-CAPILLARY: 118 mg/dL — AB (ref 70–99)
GLUCOSE-CAPILLARY: 151 mg/dL — AB (ref 70–99)
Glucose-Capillary: 111 mg/dL — ABNORMAL HIGH (ref 70–99)
Glucose-Capillary: 67 mg/dL — ABNORMAL LOW (ref 70–99)
Glucose-Capillary: 91 mg/dL (ref 70–99)
Glucose-Capillary: 195 mg/dL — ABNORMAL HIGH (ref 70–99)

## 2017-07-13 SURGERY — ARTHROPLASTY, KNEE, TOTAL
Anesthesia: Spinal | Site: Knee | Laterality: Right

## 2017-07-13 MED ORDER — METHOCARBAMOL 500 MG PO TABS
500.0000 mg | ORAL_TABLET | Freq: Four times a day (QID) | ORAL | Status: DC | PRN
  Administered 2017-07-13 – 2017-07-15 (×4): 500 mg via ORAL
  Filled 2017-07-13 (×3): qty 1

## 2017-07-13 MED ORDER — OXYCODONE HCL 5 MG PO TABS
ORAL_TABLET | ORAL | Status: AC
  Filled 2017-07-13: qty 2

## 2017-07-13 MED ORDER — FENTANYL CITRATE (PF) 100 MCG/2ML IJ SOLN
INTRAMUSCULAR | Status: AC
  Filled 2017-07-13: qty 2

## 2017-07-13 MED ORDER — OXYCODONE HCL ER 10 MG PO T12A
10.0000 mg | EXTENDED_RELEASE_TABLET | Freq: Two times a day (BID) | ORAL | Status: DC
  Administered 2017-07-13 – 2017-07-14 (×3): 10 mg via ORAL
  Filled 2017-07-13 (×3): qty 1

## 2017-07-13 MED ORDER — METOCLOPRAMIDE HCL 5 MG PO TABS: 5.0000 mg | ORAL_TABLET | Freq: Three times a day (TID) | ORAL | Status: DC | PRN

## 2017-07-13 MED ORDER — METOCLOPRAMIDE HCL 5 MG/ML IJ SOLN: 5.0000 mg | Freq: Three times a day (TID) | INTRAMUSCULAR | Status: DC | PRN

## 2017-07-13 MED ORDER — TRANEXAMIC ACID 1000 MG/10ML IV SOLN
1000.0000 mg | Freq: Once | INTRAVENOUS | Status: AC
  Administered 2017-07-13: 1000 mg via INTRAVENOUS
  Filled 2017-07-13: qty 10

## 2017-07-13 MED ORDER — VANCOMYCIN HCL 1000 MG IV SOLR
INTRAVENOUS | Status: AC
  Filled 2017-07-13: qty 1000

## 2017-07-13 MED ORDER — MIDAZOLAM HCL 2 MG/2ML IJ SOLN
2.0000 mg | Freq: Once | INTRAMUSCULAR | Status: AC
  Administered 2017-07-13: 2 mg via INTRAVENOUS

## 2017-07-13 MED ORDER — SULFAMETHOXAZOLE-TRIMETHOPRIM 800-160 MG PO TABS: 1.0000 | ORAL_TABLET | Freq: Two times a day (BID) | ORAL | 0 refills | Status: DC

## 2017-07-13 MED ORDER — CELECOXIB 200 MG PO CAPS
200.0000 mg | ORAL_CAPSULE | Freq: Two times a day (BID) | ORAL | Status: DC
  Administered 2017-07-13 – 2017-07-15 (×4): 200 mg via ORAL
  Filled 2017-07-13 (×4): qty 1

## 2017-07-13 MED ORDER — METOCLOPRAMIDE HCL 5 MG/ML IJ SOLN: 10.0000 mg | Freq: Once | INTRAMUSCULAR | Status: DC | PRN

## 2017-07-13 MED ORDER — OXYCODONE HCL ER 10 MG PO T12A: 10.0000 mg | EXTENDED_RELEASE_TABLET | Freq: Two times a day (BID) | ORAL | 0 refills | Status: DC

## 2017-07-13 MED ORDER — MENTHOL 3 MG MT LOZG: 1.0000 | LOZENGE | OROMUCOSAL | Status: DC | PRN

## 2017-07-13 MED ORDER — DEXAMETHASONE SODIUM PHOSPHATE 10 MG/ML IJ SOLN
10.0000 mg | Freq: Once | INTRAMUSCULAR | Status: AC
  Administered 2017-07-14: 10 mg via INTRAVENOUS
  Filled 2017-07-13: qty 1

## 2017-07-13 MED ORDER — ONDANSETRON HCL 4 MG PO TABS: 4.0000 mg | ORAL_TABLET | Freq: Three times a day (TID) | ORAL | 0 refills | Status: DC | PRN

## 2017-07-13 MED ORDER — KETOROLAC TROMETHAMINE 15 MG/ML IJ SOLN
30.0000 mg | Freq: Four times a day (QID) | INTRAMUSCULAR | Status: AC
  Administered 2017-07-13 – 2017-07-14 (×4): 30 mg via INTRAVENOUS
  Filled 2017-07-13 (×3): qty 2

## 2017-07-13 MED ORDER — BUPIVACAINE LIPOSOME 1.3 % IJ SUSP
20.0000 mL | INTRAMUSCULAR | Status: AC
  Administered 2017-07-13: 20 mL
  Filled 2017-07-13: qty 20

## 2017-07-13 MED ORDER — ONDANSETRON HCL 4 MG PO TABS
4.0000 mg | ORAL_TABLET | Freq: Four times a day (QID) | ORAL | Status: DC | PRN
  Administered 2017-07-15: 4 mg via ORAL
  Filled 2017-07-13: qty 1

## 2017-07-13 MED ORDER — KETOROLAC TROMETHAMINE 15 MG/ML IJ SOLN
INTRAMUSCULAR | Status: AC
  Filled 2017-07-13: qty 1

## 2017-07-13 MED ORDER — ATORVASTATIN CALCIUM 40 MG PO TABS
40.0000 mg | ORAL_TABLET | Freq: Every day | ORAL | Status: DC
  Administered 2017-07-13 – 2017-07-14 (×2): 40 mg via ORAL
  Filled 2017-07-13 (×2): qty 1

## 2017-07-13 MED ORDER — CLONIDINE HCL (ANALGESIA) 100 MCG/ML EP SOLN
EPIDURAL | Status: DC | PRN
  Administered 2017-07-13: 100 ug

## 2017-07-13 MED ORDER — PROPOFOL 10 MG/ML IV BOLUS: INTRAVENOUS | Status: DC | PRN

## 2017-07-13 MED ORDER — BUPIVACAINE IN DEXTROSE 0.75-8.25 % IT SOLN
INTRATHECAL | Status: DC | PRN
  Administered 2017-07-13: 1.5 mL via INTRATHECAL

## 2017-07-13 MED ORDER — CHLORHEXIDINE GLUCONATE 4 % EX LIQD: 60.0000 mL | Freq: Once | CUTANEOUS | Status: DC

## 2017-07-13 MED ORDER — MIDAZOLAM HCL 2 MG/2ML IJ SOLN
INTRAMUSCULAR | Status: AC
  Administered 2017-07-13: 2 mg via INTRAVENOUS
  Filled 2017-07-13: qty 2

## 2017-07-13 MED ORDER — FENTANYL CITRATE (PF) 100 MCG/2ML IJ SOLN
100.0000 ug | Freq: Once | INTRAMUSCULAR | Status: AC
  Administered 2017-07-13: 100 ug via INTRAVENOUS

## 2017-07-13 MED ORDER — FENTANYL CITRATE (PF) 100 MCG/2ML IJ SOLN
25.0000 ug | INTRAMUSCULAR | Status: DC | PRN
  Administered 2017-07-13 (×3): 50 ug via INTRAVENOUS

## 2017-07-13 MED ORDER — GABAPENTIN 300 MG PO CAPS
300.0000 mg | ORAL_CAPSULE | Freq: Three times a day (TID) | ORAL | Status: DC
  Administered 2017-07-13 – 2017-07-15 (×6): 300 mg via ORAL
  Filled 2017-07-13 (×6): qty 1

## 2017-07-13 MED ORDER — POLYETHYLENE GLYCOL 3350 17 G PO PACK: 17.0000 g | PACK | Freq: Every day | ORAL | Status: DC | PRN

## 2017-07-13 MED ORDER — METHOCARBAMOL 750 MG PO TABS: 750.0000 mg | ORAL_TABLET | Freq: Two times a day (BID) | ORAL | 0 refills | Status: DC | PRN

## 2017-07-13 MED ORDER — INSULIN GLARGINE 100 UNIT/ML ~~LOC~~ SOLN
32.0000 [IU] | Freq: Two times a day (BID) | SUBCUTANEOUS | Status: DC
  Administered 2017-07-13 – 2017-07-15 (×4): 32 [IU] via SUBCUTANEOUS
  Filled 2017-07-13 (×4): qty 0.32

## 2017-07-13 MED ORDER — PREGABALIN 25 MG PO CAPS
50.0000 mg | ORAL_CAPSULE | Freq: Three times a day (TID) | ORAL | Status: DC
  Administered 2017-07-13 – 2017-07-15 (×6): 50 mg via ORAL
  Filled 2017-07-13: qty 2
  Filled 2017-07-13: qty 1
  Filled 2017-07-13 (×4): qty 2

## 2017-07-13 MED ORDER — CEFAZOLIN SODIUM-DEXTROSE 2-4 GM/100ML-% IV SOLN
2.0000 g | INTRAVENOUS | Status: AC
  Administered 2017-07-13: 2 g via INTRAVENOUS
  Filled 2017-07-13: qty 100

## 2017-07-13 MED ORDER — OXYCODONE HCL 5 MG PO TABS: 5.0000 mg | ORAL_TABLET | ORAL | 0 refills | Status: DC | PRN

## 2017-07-13 MED ORDER — AMLODIPINE BESYLATE 10 MG PO TABS
10.0000 mg | ORAL_TABLET | Freq: Every day | ORAL | Status: DC
  Administered 2017-07-13 – 2017-07-14 (×2): 10 mg via ORAL
  Filled 2017-07-13 (×2): qty 1

## 2017-07-13 MED ORDER — ACETAMINOPHEN 325 MG PO TABS: 325.0000 mg | ORAL_TABLET | Freq: Four times a day (QID) | ORAL | Status: DC | PRN

## 2017-07-13 MED ORDER — OXYCODONE HCL 5 MG PO TABS
5.0000 mg | ORAL_TABLET | ORAL | Status: DC | PRN
  Administered 2017-07-13 – 2017-07-14 (×3): 10 mg via ORAL
  Administered 2017-07-15: 5 mg via ORAL
  Filled 2017-07-13 (×3): qty 2

## 2017-07-13 MED ORDER — SODIUM CHLORIDE 0.9 % IV SOLN
INTRAVENOUS | Status: DC
  Administered 2017-07-13 – 2017-07-14 (×2): via INTRAVENOUS

## 2017-07-13 MED ORDER — IRBESARTAN 300 MG PO TABS
300.0000 mg | ORAL_TABLET | Freq: Every day | ORAL | Status: DC
  Administered 2017-07-13 – 2017-07-15 (×3): 300 mg via ORAL
  Filled 2017-07-13 (×3): qty 1

## 2017-07-13 MED ORDER — PHENOL 1.4 % MT LIQD: 1.0000 | OROMUCOSAL | Status: DC | PRN

## 2017-07-13 MED ORDER — ROPIVACAINE HCL 7.5 MG/ML IJ SOLN
INTRAMUSCULAR | Status: DC | PRN
  Administered 2017-07-13: 20 mL via PERINEURAL

## 2017-07-13 MED ORDER — 0.9 % SODIUM CHLORIDE (POUR BTL) OPTIME
TOPICAL | Status: DC | PRN
  Administered 2017-07-13: 1000 mL

## 2017-07-13 MED ORDER — OXYCODONE HCL 5 MG PO TABS: 10.0000 mg | ORAL_TABLET | ORAL | Status: DC | PRN

## 2017-07-13 MED ORDER — SENNOSIDES-DOCUSATE SODIUM 8.6-50 MG PO TABS: 1.0000 | ORAL_TABLET | Freq: Every evening | ORAL | 1 refills | Status: DC | PRN

## 2017-07-13 MED ORDER — METHOCARBAMOL 500 MG PO TABS
ORAL_TABLET | ORAL | Status: AC
Start: 2017-07-13 — End: 2017-07-14
  Filled 2017-07-13: qty 1

## 2017-07-13 MED ORDER — ACETAMINOPHEN 500 MG PO TABS
1000.0000 mg | ORAL_TABLET | Freq: Four times a day (QID) | ORAL | Status: AC
  Administered 2017-07-13 – 2017-07-14 (×4): 1000 mg via ORAL
  Filled 2017-07-13 (×4): qty 2

## 2017-07-13 MED ORDER — SORBITOL 70 % SOLN: 30.0000 mL | Freq: Every day | Status: DC | PRN

## 2017-07-13 MED ORDER — DOCUSATE SODIUM 100 MG PO CAPS
100.0000 mg | ORAL_CAPSULE | Freq: Two times a day (BID) | ORAL | Status: DC
  Administered 2017-07-14 – 2017-07-15 (×2): 100 mg via ORAL
  Filled 2017-07-13 (×3): qty 1

## 2017-07-13 MED ORDER — ASPIRIN EC 81 MG PO TBEC: 81.0000 mg | DELAYED_RELEASE_TABLET | Freq: Two times a day (BID) | ORAL | 0 refills | Status: DC

## 2017-07-13 MED ORDER — INSULIN ASPART 100 UNIT/ML ~~LOC~~ SOLN
12.0000 [IU] | Freq: Two times a day (BID) | SUBCUTANEOUS | Status: DC
  Administered 2017-07-13 – 2017-07-15 (×4): 12 [IU] via SUBCUTANEOUS

## 2017-07-13 MED ORDER — TRANEXAMIC ACID 1000 MG/10ML IV SOLN
1000.0000 mg | INTRAVENOUS | Status: AC
  Administered 2017-07-13: 1000 mg via INTRAVENOUS
  Filled 2017-07-13: qty 1100

## 2017-07-13 MED ORDER — FENTANYL CITRATE (PF) 100 MCG/2ML IJ SOLN
INTRAMUSCULAR | Status: AC
  Administered 2017-07-13: 100 ug via INTRAVENOUS
  Filled 2017-07-13: qty 2

## 2017-07-13 MED ORDER — LINAGLIPTIN 5 MG PO TABS
5.0000 mg | ORAL_TABLET | Freq: Every day | ORAL | Status: DC
  Administered 2017-07-14 – 2017-07-15 (×2): 5 mg via ORAL
  Filled 2017-07-13 (×2): qty 1

## 2017-07-13 MED ORDER — CEFAZOLIN SODIUM-DEXTROSE 2-4 GM/100ML-% IV SOLN
2.0000 g | Freq: Four times a day (QID) | INTRAVENOUS | Status: AC
  Administered 2017-07-13 – 2017-07-14 (×3): 2 g via INTRAVENOUS
  Filled 2017-07-13 (×3): qty 100

## 2017-07-13 MED ORDER — SODIUM CHLORIDE 0.9% FLUSH
INTRAVENOUS | Status: DC | PRN
  Administered 2017-07-13: 40 mL

## 2017-07-13 MED ORDER — ONDANSETRON HCL 4 MG/2ML IJ SOLN
4.0000 mg | Freq: Four times a day (QID) | INTRAMUSCULAR | Status: DC | PRN
  Administered 2017-07-13: 4 mg via INTRAVENOUS
  Filled 2017-07-13: qty 2

## 2017-07-13 MED ORDER — METHOCARBAMOL 1000 MG/10ML IJ SOLN
500.0000 mg | Freq: Four times a day (QID) | INTRAVENOUS | Status: DC | PRN
  Filled 2017-07-13: qty 5

## 2017-07-13 MED ORDER — INSULIN ASPART 100 UNIT/ML ~~LOC~~ SOLN
0.0000 [IU] | Freq: Every day | SUBCUTANEOUS | Status: DC
  Administered 2017-07-14: 3 [IU] via SUBCUTANEOUS

## 2017-07-13 MED ORDER — ASPIRIN 81 MG PO CHEW
81.0000 mg | CHEWABLE_TABLET | Freq: Two times a day (BID) | ORAL | Status: DC
  Administered 2017-07-13 – 2017-07-15 (×4): 81 mg via ORAL
  Filled 2017-07-13 (×4): qty 1

## 2017-07-13 MED ORDER — DIPHENHYDRAMINE HCL 12.5 MG/5ML PO ELIX: 25.0000 mg | ORAL_SOLUTION | ORAL | Status: DC | PRN

## 2017-07-13 MED ORDER — VANCOMYCIN HCL 1000 MG IV SOLR
INTRAVENOUS | Status: DC | PRN
  Administered 2017-07-13: 1000 mg

## 2017-07-13 MED ORDER — ALUM & MAG HYDROXIDE-SIMETH 200-200-20 MG/5ML PO SUSP: 30.0000 mL | ORAL | Status: DC | PRN

## 2017-07-13 MED ORDER — MAGNESIUM CITRATE PO SOLN: 1.0000 | Freq: Once | ORAL | Status: DC | PRN

## 2017-07-13 MED ORDER — ONDANSETRON HCL 4 MG/2ML IJ SOLN
INTRAMUSCULAR | Status: DC | PRN
  Administered 2017-07-13: 4 mg via INTRAVENOUS

## 2017-07-13 MED ORDER — LIDOCAINE HCL (CARDIAC) PF 100 MG/5ML IV SOSY
PREFILLED_SYRINGE | INTRAVENOUS | Status: DC | PRN
  Administered 2017-07-13: 40 mg via INTRAVENOUS

## 2017-07-13 MED ORDER — SODIUM CHLORIDE 0.9 % IR SOLN
Status: DC | PRN
  Administered 2017-07-13: 1000 mL

## 2017-07-13 MED ORDER — MEPERIDINE HCL 50 MG/ML IJ SOLN: 6.2500 mg | INTRAMUSCULAR | Status: DC | PRN

## 2017-07-13 MED ORDER — INSULIN ASPART 100 UNIT/ML ~~LOC~~ SOLN
0.0000 [IU] | Freq: Three times a day (TID) | SUBCUTANEOUS | Status: DC
  Administered 2017-07-13: 4 [IU] via SUBCUTANEOUS
  Administered 2017-07-14: 7 [IU] via SUBCUTANEOUS
  Administered 2017-07-14 (×2): 4 [IU] via SUBCUTANEOUS
  Administered 2017-07-15: 7 [IU] via SUBCUTANEOUS

## 2017-07-13 MED ORDER — PROMETHAZINE HCL 25 MG PO TABS: 25.0000 mg | ORAL_TABLET | Freq: Four times a day (QID) | ORAL | 1 refills | Status: DC | PRN

## 2017-07-13 MED ORDER — PROPOFOL 500 MG/50ML IV EMUL
INTRAVENOUS | Status: DC | PRN
  Administered 2017-07-13: 75 ug/kg/min via INTRAVENOUS

## 2017-07-13 MED ORDER — HYDROMORPHONE HCL 1 MG/ML IJ SOLN
0.5000 mg | INTRAMUSCULAR | Status: DC | PRN
  Administered 2017-07-13: 1 mg via INTRAVENOUS
  Filled 2017-07-13: qty 1

## 2017-07-13 MED ORDER — LACTATED RINGERS IV SOLN
INTRAVENOUS | Status: DC
  Administered 2017-07-13 (×2): via INTRAVENOUS

## 2017-07-13 MED ORDER — ESCITALOPRAM OXALATE 10 MG PO TABS
20.0000 mg | ORAL_TABLET | Freq: Every day | ORAL | Status: DC
  Administered 2017-07-13 – 2017-07-15 (×3): 20 mg via ORAL
  Filled 2017-07-13 (×2): qty 2
  Filled 2017-07-13: qty 1

## 2017-07-13 MED ORDER — SULFAMETHOXAZOLE-TRIMETHOPRIM 800-160 MG PO TABS
1.0000 | ORAL_TABLET | Freq: Two times a day (BID) | ORAL | Status: DC
  Administered 2017-07-13 – 2017-07-15 (×4): 1 via ORAL
  Filled 2017-07-13 (×4): qty 1

## 2017-07-13 SURGICAL SUPPLY — 76 items
ALCOHOL ISOPROPYL (RUBBING) (MISCELLANEOUS) ×2 IMPLANT
BAG DECANTER FOR FLEXI CONT (MISCELLANEOUS) ×2 IMPLANT
BANDAGE ACE 6X5 VEL STRL LF (GAUZE/BANDAGES/DRESSINGS) ×2 IMPLANT
BANDAGE ESMARK 6X9 LF (GAUZE/BANDAGES/DRESSINGS) ×1 IMPLANT
BENZOIN TINCTURE PRP APPL 2/3 (GAUZE/BANDAGES/DRESSINGS) ×2 IMPLANT
BLADE SAW SGTL 13.0X1.19X90.0M (BLADE) ×2 IMPLANT
BNDG ELASTIC 6X10 VLCR STRL LF (GAUZE/BANDAGES/DRESSINGS) ×2 IMPLANT
BNDG ESMARK 6X9 LF (GAUZE/BANDAGES/DRESSINGS) ×2
BONE CEMENT PALACOS R-G (Cement) ×4 IMPLANT
BOWL SMART MIX CTS (DISPOSABLE) ×2 IMPLANT
CEMENT BONE PALACOS R-G (Cement) ×2 IMPLANT
CLSR STERI-STRIP ANTIMIC 1/2X4 (GAUZE/BANDAGES/DRESSINGS) ×2 IMPLANT
COMPONENT TIBIA RIGHT SZ 4 (Knees) ×1 IMPLANT
COVER SURGICAL LIGHT HANDLE (MISCELLANEOUS) ×2 IMPLANT
CUFF TOURNIQUET SINGLE 34IN LL (TOURNIQUET CUFF) ×2 IMPLANT
CUFF TOURNIQUET SINGLE 44IN (TOURNIQUET CUFF) IMPLANT
DRAPE EXTREMITY T 121X128X90 (DRAPE) ×2 IMPLANT
DRAPE HALF SHEET 40X57 (DRAPES) ×2 IMPLANT
DRAPE INCISE IOBAN 66X45 STRL (DRAPES) ×2 IMPLANT
DRAPE ORTHO SPLIT 77X108 STRL (DRAPES) ×2
DRAPE POUCH INSTRU U-SHP 10X18 (DRAPES) ×2 IMPLANT
DRAPE SURG 17X11 SM STRL (DRAPES) ×4 IMPLANT
DRAPE SURG ORHT 6 SPLT 77X108 (DRAPES) ×2 IMPLANT
DRSG AQUACEL AG ADV 3.5X14 (GAUZE/BANDAGES/DRESSINGS) ×2 IMPLANT
DRSG PAD ABDOMINAL 8X10 ST (GAUZE/BANDAGES/DRESSINGS) ×2 IMPLANT
DURAPREP 26ML APPLICATOR (WOUND CARE) ×4 IMPLANT
ELECT CAUTERY BLADE 6.4 (BLADE) ×2 IMPLANT
ELECT REM PT RETURN 9FT ADLT (ELECTROSURGICAL) ×2
ELECTRODE REM PT RTRN 9FT ADLT (ELECTROSURGICAL) ×1 IMPLANT
FEM COMP OXINIUM RIGHT SZ 4 (Knees) ×2 IMPLANT
FEMORAL COMP OXINIUM RT KN SZ4 (Knees) ×1 IMPLANT
GAUZE SPONGE 4X4 12PLY STRL (GAUZE/BANDAGES/DRESSINGS) ×2 IMPLANT
GAUZE XEROFORM 1X8 LF (GAUZE/BANDAGES/DRESSINGS) ×2 IMPLANT
GLOVE BIOGEL PI IND STRL 7.0 (GLOVE) ×1 IMPLANT
GLOVE BIOGEL PI INDICATOR 7.0 (GLOVE) ×1
GLOVE ECLIPSE 7.0 STRL STRAW (GLOVE) ×2 IMPLANT
GLOVE SKINSENSE NS SZ7.5 (GLOVE) ×1
GLOVE SKINSENSE STRL SZ7.5 (GLOVE) ×1 IMPLANT
GLOVE SURG SYN 7.5  E (GLOVE) ×4
GLOVE SURG SYN 7.5 E (GLOVE) ×4 IMPLANT
GOWN STRL REIN XL XLG (GOWN DISPOSABLE) ×2 IMPLANT
GOWN STRL REUS W/ TWL LRG LVL3 (GOWN DISPOSABLE) ×1 IMPLANT
GOWN STRL REUS W/TWL LRG LVL3 (GOWN DISPOSABLE) ×1
HANDPIECE INTERPULSE COAX TIP (DISPOSABLE) ×1
HOOD PEEL AWAY FLYTE STAYCOOL (MISCELLANEOUS) ×4 IMPLANT
INSERT XLPE 11MM SZ 3-4 (Knees) ×2 IMPLANT
KIT BASIN OR (CUSTOM PROCEDURE TRAY) ×2 IMPLANT
KIT TURNOVER KIT B (KITS) ×2 IMPLANT
MANIFOLD NEPTUNE II (INSTRUMENTS) ×2 IMPLANT
MARKER SKIN DUAL TIP RULER LAB (MISCELLANEOUS) ×2 IMPLANT
NEEDLE SPNL 18GX3.5 QUINCKE PK (NEEDLE) ×2 IMPLANT
NS IRRIG 1000ML POUR BTL (IV SOLUTION) ×2 IMPLANT
PACK TOTAL JOINT (CUSTOM PROCEDURE TRAY) ×2 IMPLANT
PAD ABD 8X10 STRL (GAUZE/BANDAGES/DRESSINGS) ×2 IMPLANT
PAD ARMBOARD 7.5X6 YLW CONV (MISCELLANEOUS) ×4 IMPLANT
PADDING CAST COTTON 6X4 STRL (CAST SUPPLIES) ×2 IMPLANT
PATELLA 32MM (Knees) ×2 IMPLANT
SAW OSC TIP CART 19.5X105X1.3 (SAW) ×2 IMPLANT
SET HNDPC FAN SPRY TIP SCT (DISPOSABLE) ×1 IMPLANT
STAPLER VISISTAT 35W (STAPLE) IMPLANT
SUCTION FRAZIER HANDLE 10FR (MISCELLANEOUS) ×1
SUCTION TUBE FRAZIER 10FR DISP (MISCELLANEOUS) ×1 IMPLANT
SUT ETHILON 2 0 FS 18 (SUTURE) ×2 IMPLANT
SUT MNCRL AB 4-0 PS2 18 (SUTURE) IMPLANT
SUT VIC AB 0 CT1 27 (SUTURE) ×3
SUT VIC AB 0 CT1 27XBRD ANBCTR (SUTURE) ×3 IMPLANT
SUT VIC AB 1 CTX 27 (SUTURE) ×8 IMPLANT
SUT VIC AB 2-0 CT1 27 (SUTURE) ×3
SUT VIC AB 2-0 CT1 TAPERPNT 27 (SUTURE) ×3 IMPLANT
SYR 50ML LL SCALE MARK (SYRINGE) ×4 IMPLANT
TIBIA RIGHT SZ 4 (Knees) ×2 IMPLANT
TOWEL OR 17X24 6PK STRL BLUE (TOWEL DISPOSABLE) ×2 IMPLANT
TOWEL OR 17X26 10 PK STRL BLUE (TOWEL DISPOSABLE) ×2 IMPLANT
TRAY CATH 16FR W/PLASTIC CATH (SET/KITS/TRAYS/PACK) ×2 IMPLANT
UNDERPAD 30X30 (UNDERPADS AND DIAPERS) ×2 IMPLANT
WRAP KNEE MAXI GEL POST OP (GAUZE/BANDAGES/DRESSINGS) ×2 IMPLANT

## 2017-07-13 NOTE — Anesthesia Preprocedure Evaluation (Signed)
Anesthesia Evaluation  Patient identified by MRN, date of birth, ID band Patient awake    Reviewed: Allergy & Precautions, NPO status , Patient's Chart, lab work & pertinent test results  Airway Mallampati: II  TM Distance: >3 FB Neck ROM: Full    Dental no notable dental hx.    Pulmonary neg pulmonary ROS,    Pulmonary exam normal breath sounds clear to auscultation       Cardiovascular hypertension, Pt. on medications + DVT  Normal cardiovascular exam Rhythm:Regular Rate:Normal     Neuro/Psych negative neurological ROS  negative psych ROS   GI/Hepatic negative GI ROS, Neg liver ROS,   Endo/Other  diabetes, Type 2, Oral Hypoglycemic Agents, Insulin Dependent  Renal/GU negative Renal ROS  negative genitourinary   Musculoskeletal negative musculoskeletal ROS (+)   Abdominal   Peds negative pediatric ROS (+)  Hematology negative hematology ROS (+)   Anesthesia Other Findings   Reproductive/Obstetrics negative OB ROS                             Anesthesia Physical Anesthesia Plan  ASA: II  Anesthesia Plan: Spinal   Post-op Pain Management:  Regional for Post-op pain   Induction: Intravenous  PONV Risk Score and Plan: 2 and Ondansetron and Treatment may vary due to age or medical condition  Airway Management Planned: Simple Face Mask  Additional Equipment:   Intra-op Plan:   Post-operative Plan: Extubation in OR  Informed Consent: I have reviewed the patients History and Physical, chart, labs and discussed the procedure including the risks, benefits and alternatives for the proposed anesthesia with the patient or authorized representative who has indicated his/her understanding and acceptance.   Dental advisory given  Plan Discussed with: CRNA  Anesthesia Plan Comments:         Anesthesia Quick Evaluation

## 2017-07-13 NOTE — Progress Notes (Signed)
Orthopedic Tech Progress Note Patient Details:  Idamae SchullerDianne M Adachi 1950/10/26 [id number] Viewed order from doctor's order list CPM Right Knee CPM Right Knee: On Right Knee Flexion (Degrees): 90 Right Knee Extension (Degrees): 0 Additional Comments: trapeze bar patient helper  Post Interventions Patient Tolerated: Well Instructions Provided: Care of device  Nikki DomCrawford, Joeph Szatkowski 07/13/2017, 12:53 PM

## 2017-07-13 NOTE — Transfer of Care (Signed)
Immediate Anesthesia Transfer of Care Note  Patient: Kristin Anthony  Procedure(s) Performed: RIGHT TOTAL KNEE ARTHROPLASTY (Right Knee)  Patient Location: PACU  Anesthesia Type:MAC combined with regional for post-op pain  Level of Consciousness: awake, alert , oriented and patient cooperative  Airway & Oxygen Therapy: Patient Spontanous Breathing  Post-op Assessment: Report given to RN and Post -op Vital signs reviewed and stable  Post vital signs: Reviewed Stable    Last Vitals:  Vitals Value Taken Time  BP 107/59 07/13/2017 12:25 PM  Temp    Pulse 80 07/13/2017 12:27 PM  Resp 18 07/13/2017 12:27 PM  SpO2 93 % 07/13/2017 12:27 PM  Vitals shown include unvalidated device data.  Last Pain:  Vitals:   07/13/17 0839  TempSrc:   PainSc: 7       Patients Stated Pain Goal: 7 (33/82/50 5397)  Complications: No apparent complications

## 2017-07-13 NOTE — Progress Notes (Signed)
Hypoglycemic Event  CBG: 67  Treatment: 15 GM carbohydrate snack  Symptoms: none  Follow-up CBG: Time:1240 CBG Result: 91  Possible Reasons for Event: Inadequate meal intake  Comments/MD notified:MDA notified- ordered sugar drink     Kristin Anthony P

## 2017-07-13 NOTE — Plan of Care (Signed)

## 2017-07-13 NOTE — Anesthesia Procedure Notes (Addendum)
Spinal  Patient location during procedure: OR Staffing Anesthesiologist: Mariska Daffin, MD Performed: anesthesiologist  Preanesthetic Checklist Completed: patient identified, site marked, surgical consent, pre-op evaluation, timeout performed, IV checked, risks and benefits discussed and monitors and equipment checked Spinal Block Patient position: sitting Prep: Betadine Patient monitoring: heart rate, continuous pulse ox and blood pressure Approach: right paramedian Location: L3-4 Injection technique: single-shot Needle Needle type: Spinocan  Needle gauge: 22 G Needle length: 9 cm Additional Notes Expiration date of kit checked and confirmed. Patient tolerated procedure well, without complications.       

## 2017-07-13 NOTE — Anesthesia Procedure Notes (Signed)
Procedure Name: MAC Date/Time: 07/13/2017 10:15 AM Performed by: Oletta Lamas, CRNA Pre-anesthesia Checklist: Patient identified, Emergency Drugs available, Suction available, Patient being monitored and Timeout performed Patient Re-evaluated:Patient Re-evaluated prior to induction Oxygen Delivery Method: Simple face mask

## 2017-07-13 NOTE — Discharge Instructions (Signed)

## 2017-07-13 NOTE — H&P (Signed)
PREOPERATIVE H&P  Chief Complaint: right knee degenerative joint disease  HPI: Kristin Anthony is a 67 y.o. female who presents for surgical treatment of right knee degenerative joint disease.  She denies any changes in medical history.  Past Medical History:  Diagnosis Date  . Arthritis   . Depression   . Diabetes mellitus   . DVT (deep venous thrombosis) (HCC)   . High cholesterol   . History of hiatal hernia   . Hypertension    Past Surgical History:  Procedure Laterality Date  . ABDOMINAL HYSTERECTOMY    . APPENDECTOMY    . BACK SURGERY    . KNEE SURGERY     Social History   Socioeconomic History  . Marital status: Married    Spouse name: Not on file  . Number of children: Not on file  . Years of education: Not on file  . Highest education level: Not on file  Occupational History  . Not on file  Social Needs  . Financial resource strain: Not on file  . Food insecurity:    Worry: Not on file    Inability: Not on file  . Transportation needs:    Medical: Not on file    Non-medical: Not on file  Tobacco Use  . Smoking status: Never Smoker  . Smokeless tobacco: Never Used  Substance and Sexual Activity  . Alcohol use: No  . Drug use: No  . Sexual activity: Not on file  Lifestyle  . Physical activity:    Days per week: Not on file    Minutes per session: Not on file  . Stress: Not on file  Relationships  . Social connections:    Talks on phone: Not on file    Gets together: Not on file    Attends religious service: Not on file    Active member of club or organization: Not on file    Attends meetings of clubs or organizations: Not on file    Relationship status: Not on file  Other Topics Concern  . Not on file  Social History Narrative  . Not on file   Family History  Problem Relation Age of Onset  . Heart attack Mother   . Diabetes Father   . Hypertension Father   . Hypertension Sister   . Diabetes Sister   . Hypertension Brother   .  Diabetes Brother   . Hyperlipidemia Neg Hx   . Sudden death Neg Hx    Allergies  Allergen Reactions  . Codeine Nausea And Vomiting   Prior to Admission medications   Medication Sig Start Date End Date Taking? Authorizing Provider  amLODipine (NORVASC) 10 MG tablet Take 10 mg by mouth at bedtime.   Yes [provider]  aspirin EC 81 MG tablet Take 81 mg by mouth daily.   Yes [provider]  atorvastatin (LIPITOR) 40 MG tablet Take 40 mg by mouth at bedtime.   Yes [provider]  escitalopram (LEXAPRO) 20 MG tablet Take 20 mg by mouth daily.   Yes [provider]  ibuprofen (ADVIL,MOTRIN) 200 MG tablet Take 600 mg by mouth every 8 (eight) hours as needed (for pain.).   Yes [provider]  insulin lispro (HUMALOG KWIKPEN) 100 UNIT/ML KiwkPen Inject 12 Units into the skin 2 (two) times daily.   Yes [provider]  JANUVIA 100 MG tablet Take 100 mg by mouth.  03/13/16  Yes [provider]  olmesartan (BENICAR) 40 MG  tablet Take 40 mg by mouth daily.   Yes [provider]  TOUJEO SOLOSTAR 300 UNIT/ML SOPN Inject 32 Units into the skin 2 (two) times daily.  11/21/15  Yes [provider]     Positive ROS: All other systems have been reviewed and were otherwise negative with the exception of those mentioned in the HPI and as above.  Physical Exam: General: Alert, no acute distress Cardiovascular: No pedal edema Respiratory: No cyanosis, no use of accessory musculature GI: abdomen soft Skin: No lesions in the area of chief complaint Neurologic: Sensation intact distally Psychiatric: Patient is competent for consent with normal mood and affect Lymphatic: no lymphedema  MUSCULOSKELETAL: exam stable  Assessment: right knee degenerative joint disease  Plan: Plan for Procedure(s): RIGHT TOTAL KNEE ARTHROPLASTY  The risks benefits and alternatives were discussed with the patient including but not limited to  the risks of nonoperative treatment, versus surgical intervention including infection, bleeding, nerve injury,  blood clots, cardiopulmonary complications, morbidity, mortality, among others, and they were willing to proceed.   Preoperative templating of the joint replacement has been completed, documented, and submitted to the Operating Room personnel in order to optimize intra-operative equipment management.  Anticipated LOS equal to or greater than 2 midnights due to - Age 68 and older with one or more of the following:  - Obesity  - Expected need for hospital services (PT, OT, Nursing) required for safe  discharge  - Anticipated need for postoperative skilled nursing care or inpatient rehab  - Active co-morbidities: Diabetes  Glee ArvinMichael Dequante Tremaine, MD   07/13/2017 6:42 AM

## 2017-07-13 NOTE — Op Note (Addendum)
Total Knee Arthroplasty Procedure Note  Preoperative diagnosis: Right knee osteoarthritis  Postoperative diagnosis:same  Operative procedure: Right total knee arthroplasty. CPT [phone]  Surgeon: N. Glee Arvin, MD  Assist: Hart Carwin, RNFA  Anesthesia: Spinal, regional  Tourniquet time: 60 mins  Implants used: [employer] Femur: PS 4 Tibia: 4 Patella: 32 mm, 7.5 mm thick Polyethylene: 11 mm  Indication: Kristin Anthony is a 67 y.o. year old female with a history of knee pain. Having failed conservative management, the patient elected to proceed with a total knee arthroplasty.  We have reviewed the risk and benefits of the surgery and they elected to proceed after voicing understanding.  Procedure:  After informed consent was obtained and understanding of the risk were voiced including but not limited to bleeding, infection, damage to surrounding structures including nerves and vessels, blood clots, leg length inequality and the failure to achieve desired results, the operative extremity was marked with verbal confirmation of the patient in the holding area.   The patient was then brought to the operating room and transported to the operating room table in the supine position.  A tourniquet was applied to the operative extremity around the upper thigh. The operative limb was then prepped and draped in the usual sterile fashion and preoperative antibiotics were administered.  A time out was performed prior to the start of surgery confirming the correct extremity, preoperative antibiotic administration, as well as team members, implants and instruments available for the case. Correct surgical site was also confirmed with preoperative radiographs. The limb was then elevated for exsanguination and the tourniquet was inflated. A midline incision was made and a standard medial parapatellar approach was performed.  The patella was prepared and sized to a 32 mm.  A cover was  placed on the patella for protection from retractors.  We then turned our attention to the femur. Posterior cruciate ligament was sacrificed. Start site was drilled in the femur and the intramedullary distal femoral cutting guide was placed, set at 5 degrees valgus, taking 9 mm of distal resection. The distal cut was made. Osteophytes were then removed. Next, the proximal tibial cutting guide was placed with appropriate slope, varus/valgus alignment and depth of resection. The proximal tibial cut was made. Gap blocks were then used to assess the extension gap and alignment, and appropriate soft tissue releases were performed. Attention was turned back to the femur, which was sized using the sizing guide to a size 4. Appropriate rotation of the femoral component was determined using epicondylar axis, Whiteside's line, and assessing the flexion gap under ligament tension. The appropriate size 4-in-1 cutting block was placed and cuts were made. Posterior femoral osteophytes and uncapped bone were then removed with the curved osteotome. The tibia was sized for a size 4 component. The femoral box-cutting guide was placed and prepared for a PS femoral component. Trial components were placed, and stability was checked in full extension, mid-flexion, and deep flexion. Proper tibial rotation was determined and marked.  The patella tracked well without a lateral release. Trial components were then removed and tibial preparation performed. A posterior capsular injection comprising of 20 cc of 1.3% exparel and 40 cc of normal saline was performed for postoperative pain control. The bony surfaces were irrigated with a pulse lavage and then dried. Bone cement was vacuum mixed on the back table, and the final components sized above were cemented into place. After cement had finished curing, excess cement was removed. The stability of the  construct was re-evaluated throughout a range of motion and found to be acceptable. The  trial liner was removed, the knee was copiously irrigated, and the knee was re-evaluated for any excess bone debris. The real polyethylene liner, 11 mm thick, was inserted and checked to ensure the locking mechanism had engaged appropriately. The tourniquet was deflated and hemostasis was achieved. The wound was irrigated with normal saline.  One gram of vancomycin powder was placed in the surgical bed. A drain was not placed. Capsular closure was performed with a #1 vicryl, subcutaneous fat closed with a 0 vicryl suture, then subcutaneous tissue closed with interrupted 2.0 vicryl suture. The skin was then closed with a 3.0 monocryl. A sterile dressing was applied.  The patient was awakened in the operating room and taken to recovery in stable condition. All sponge, needle, and instrument counts were correct at the end of the case.  Position: supine  Complications: none.  Time Out: performed   Drains/Packing: none  Estimated blood loss: minimal  Returned to Recovery Room: in good condition.   Antibiotics: yes   Mechanical VTE (DVT) Prophylaxis: sequential compression devices, TED thigh-high  Chemical VTE (DVT) Prophylaxis: aspirin  Fluid Replacement  Crystalloid: see anesthesia record Blood: none  FFP: none   Specimens Removed: 1 to pathology   Sponge and Instrument Count Correct? yes   PACU: portable radiograph - knee AP and Lateral   Admission: inpatient status  Plan/RTC: Return in 2 weeks for wound check.   Weight Bearing/Load Lower Extremity: full   N. Glee ArvinMichael Venita Seng, MD [facility] [phone] 11:48 AM

## 2017-07-13 NOTE — Anesthesia Postprocedure Evaluation (Signed)
Anesthesia Post Note  Patient: Cherly AndersonDianne M Tomczak  Procedure(s) Performed: RIGHT TOTAL KNEE ARTHROPLASTY (Right Knee)     Patient location during evaluation: PACU Anesthesia Type: Spinal Level of consciousness: awake and alert Pain management: pain level controlled Vital Signs Assessment: post-procedure vital signs reviewed and stable Respiratory status: spontaneous breathing, nonlabored ventilation, respiratory function stable and patient connected to nasal cannula oxygen Cardiovascular status: blood pressure returned to baseline and stable Postop Assessment: no apparent nausea or vomiting Anesthetic complications: no    Last Vitals:  Vitals:   07/13/17 1310 07/13/17 1320  BP: 105/88   Pulse: 80 78  Resp: 12 14  Temp:    SpO2: 96% 94%    Last Pain:  Vitals:   07/13/17 1351  TempSrc:   PainSc: 4                  Phillips Groutarignan, Michell Kader

## 2017-07-13 NOTE — Anesthesia Procedure Notes (Signed)
Anesthesia Regional Block: Adductor canal block   Pre-Anesthetic Checklist: ,, timeout performed, Correct Patient, Correct Site, Correct Laterality, Correct Procedure, Correct Position, site marked, Risks and benefits discussed,  Surgical consent,  Pre-op evaluation,  At surgeon's request and post-op pain management  Laterality: Right and Lower  Prep: Maximum Sterile Barrier Precautions used, chloraprep       Needles:  Injection technique: Single-shot  Needle Type: Echogenic Stimulator Needle     Needle Length: 10cm      Additional Needles:   Procedures:,,,, ultrasound used (permanent image in chart),,,,  Narrative:  Start time: 07/13/2017 9:13 AM End time: 07/13/2017 9:23 AM Injection made incrementally with aspirations every 5 mL.  Performed by: Personally  Anesthesiologist: Phillips Groutarignan, Kendell Sagraves, MD  Additional Notes: Risks, benefits and alternative to block explained extensively.  Patient tolerated procedure well, without complications.

## 2017-07-14 ENCOUNTER — Other Ambulatory Visit: Payer: Self-pay

## 2017-07-14 ENCOUNTER — Encounter (HOSPITAL_COMMUNITY): Payer: Self-pay | Admitting: General Practice

## 2017-07-14 LAB — CBC
HCT: 34.7 % — ABNORMAL LOW (ref 36.0–46.0)
HEMOGLOBIN: 11.1 g/dL — AB (ref 12.0–15.0)
MCH: 30.1 pg (ref 26.0–34.0)
MCHC: 32 g/dL (ref 30.0–36.0)
MCV: 94 fL (ref 78.0–100.0)
Platelets: 151 10*3/uL (ref 150–400)
RBC: 3.69 MIL/uL — AB (ref 3.87–5.11)
RDW: 13.1 % (ref 11.5–15.5)
WBC: 8.8 10*3/uL (ref 4.0–10.5)

## 2017-07-14 LAB — BASIC METABOLIC PANEL
ANION GAP: 3 — AB (ref 5–15)
BUN: 10 mg/dL (ref 8–23)
CHLORIDE: 104 mmol/L (ref 98–111)
CO2: 26 mmol/L (ref 22–32)
Calcium: 7.9 mg/dL — ABNORMAL LOW (ref 8.9–10.3)
Creatinine, Ser: 0.68 mg/dL (ref 0.44–1.00)
GFR calc Af Amer: >60 mL/min (ref >60)
GFR calc non Af Amer: >60 mL/min (ref >60)
GLUCOSE: 169 mg/dL — AB (ref 70–99)
POTASSIUM: 3.7 mmol/L (ref 3.5–5.1)
SODIUM: 133 mmol/L — AB (ref 135–145)

## 2017-07-14 LAB — GLUCOSE, CAPILLARY
GLUCOSE-CAPILLARY: 183 mg/dL — AB (ref 70–99)
GLUCOSE-CAPILLARY: 202 mg/dL — AB (ref 70–99)
Glucose-Capillary: 174 mg/dL — ABNORMAL HIGH (ref 70–99)
Glucose-Capillary: 257 mg/dL — ABNORMAL HIGH (ref 70–99)

## 2017-07-14 NOTE — Progress Notes (Signed)
Physical Therapy Treatment Patient Details Name: Kristin Anthony MRN: [id number] DOB: [birth year] Today's Date: 07/14/2017    History of Present Illness 67 y.o. female who presents for surgical treatment of right knee degenerative joint disease. s/p R TKA 07/13/2017. PMH HTN, DM II, DVT, OA    PT Comments    Pt making good progress towards her goals this afternoon, however continues to be limited in safe mobility by R knee pain and decreased ROM. Pt currently supervision for bed mobility, min guard for transfers, and min A for ambulation of 250 feet with RW and ascent/descent of 5 steps. Pt to work on seated exercise tomorrow. Pt will benefit from additional PT to progress safe mobility in her home environment at discharge.   Follow Up Recommendations  Follow surgeon's recommendation for DC plan and follow-up therapies     Equipment Recommendations  None recommended by PT    Recommendations for Other Services       Precautions / Restrictions Precautions Precautions: Knee Precaution Booklet Issued: Yes (comment) Restrictions Weight Bearing Restrictions: Yes RLE Weight Bearing: Weight bearing as tolerated LLE Weight Bearing: Weight bearing as tolerated    Mobility  Bed Mobility Overal bed mobility: Needs Assistance Bed Mobility: Supine to Sit     Supine to sit: Supervision     General bed mobility comments: supervision for safety  Transfers Overall transfer level: Needs assistance Equipment used: Rolling walker (2 wheeled) Transfers: Sit to/from Stand Sit to Stand: Min guard         General transfer comment: min guard for safety  Ambulation/Gait Ambulation/Gait assistance: Min assist Gait Distance (Feet): 250 Feet Assistive device: Rolling walker (2 wheeled) Gait Pattern/deviations: Step-through pattern;Decreased stride length;Decreased weight shift to right;Decreased step length - left;Shuffle;Antalgic Gait velocity: slowed Gait velocity interpretation: <1.8  ft/sec, indicate of risk for recurrent falls General Gait Details: min A for steadying with gait, vc for breathing with ambulation, and for increased UE support on RW with weightshift to R to advance L LE   Stairs Stairs: Yes Stairs assistance: Min assist Stair Management: Two rails;Forwards;Step to pattern Number of Stairs: 5 General stair comments: min A for steadying with ascent/descent vc for sequencing and use of L LE    Wheelchair Mobility    Modified Rankin (Stroke Patients Only)       Balance Overall balance assessment: Needs assistance Sitting-balance support: No upper extremity supported;Feet supported Sitting balance-Leahy Scale: Good     Standing balance support: During functional activity;Single extremity supported Standing balance-Leahy Scale: Poor Standing balance comment: reliant on UE support from RW                            Cognition Arousal/Alertness: Awake/alert Behavior During Therapy: [facility] for tasks assessed/performed Overall Cognitive Status: Within Functional Limits for tasks assessed                                        Exercises Total Joint Exercises Ankle Circles/Pumps: AROM;Both;10 reps Heel Slides: AROM;Right;Supine;5 reps    General Comments General comments (skin integrity, edema, etc.): VSS      Pertinent Vitals/Pain Pain Assessment: 0-10 Pain Score: 4  Pain Location: R knee Pain Descriptors / Indicators: Sore;Aching;Burning Pain Intervention(s): Limited activity within patient's tolerance;Monitored during session;Repositioned    Home Living Family/patient expects to be discharged to:: Private residence Living Arrangements: Spouse/significant other Available  Help at Discharge: Family;Available 24 hours/day Type of Home: House Home Access: Stairs to enter   Home Layout: Multi-level Home Equipment: Environmental consultantWalker - 2 wheels;Cane - single point;Hand held shower head;Shower seat      Prior Function Level  of Independence: Independent      Comments: driving, community ambulator   PT Goals (current goals can now be found in the care plan section) Acute Rehab PT Goals Patient Stated Goal: get around with less pain PT Goal Formulation: With patient Time For Goal Achievement: 07/28/17 Potential to Achieve Goals: Good    Frequency    7X/week      PT Plan Current plan remains appropriate    Co-evaluation              AM-PAC PT "6 Clicks" Daily Activity  Outcome Measure  Difficulty turning over in bed (including adjusting bedclothes, sheets and blankets)?: A Little Difficulty moving from lying on back to sitting on the side of the bed? : A Little Difficulty sitting down on and standing up from a chair with arms (e.g., wheelchair, bedside commode, etc,.)?: Unable Help needed moving to and from a bed to chair (including a wheelchair)?: A Little Help needed walking in hospital room?: A Little Help needed climbing 3-5 steps with a railing? : A Little 6 Click Score: 16    End of Session Equipment Utilized During Treatment: Gait belt Activity Tolerance: Patient tolerated treatment well Patient left: in chair;with call bell/phone within reach;with family/visitor present Nurse Communication: Mobility status PT Visit Diagnosis: Unsteadiness on feet (R26.81);Other abnormalities of gait and mobility (R26.89);Muscle weakness (generalized) (M62.81);Difficulty in walking, not elsewhere classified (R26.2);Pain Pain - Right/Left: Right Pain - part of body: Knee     Time: 1500-1520 PT Time Calculation (min) (ACUTE ONLY): 20 min  Charges:  $Gait Training: 8-22 mins                    G Codes:       Rakel Junio B. Beverely RisenVan Anthony PT, DPT Acute Rehabilitation  [phone] Pager [phone]     Kristin Anthony 07/14/2017, 3:45 PM

## 2017-07-14 NOTE — Progress Notes (Addendum)
Subjective: 1 Day Post-Op Procedure(s) (LRB): RIGHT TOTAL KNEE ARTHROPLASTY (Right) Patient reports pain as mild.   Had some nausea overnight.  Doing much better this am.  Objective: Vital signs in last 24 hours: Temp:  [97.7 F (36.5 C)-98.3 F (36.8 C)] 98 F (36.7 C) (07/02 0530) Pulse Rate:  [72-95] 88 (07/02 0530) Resp:  [11-20] 16 (07/01 2100) BP: (103-170)/(59-108) 149/71 (07/02 0530) SpO2:  [91 %-100 %] 91 % (07/02 0530) Weight:  [209 lb (94.8 kg)] 209 lb (94.8 kg) (07/01 0802)  Intake/Output from previous day: 07/01 0701 - 07/02 0700 In: 2000.2 [P.O.:720; I.V.:1180.2; IV Piggyback:100] Out: 150 [Urine:100; Blood:50] Intake/Output this shift: No intake/output data recorded.  Recent Labs    07/14/17 0404  HGB 11.1*   Recent Labs    07/14/17 0404  WBC 8.8  RBC 3.69*  HCT 34.7*  PLT 151   Recent Labs    07/14/17 0404  NA 133*  K 3.7  CL 104  CO2 26  BUN 10  CREATININE 0.68  GLUCOSE 169*  CALCIUM 7.9*   No results for input(s): LABPT, INR in the last 72 hours.  Neurologically intact Neurovascular intact Sensation intact distally Intact pulses distally Dorsiflexion/Plantar flexion intact Incision: dressing C/D/I No cellulitis present Compartment soft  Anticipated LOS equal to or greater than 2 midnights due to - Age 68 and older with one or more of the following:  - Obesity  - Expected need for hospital services (PT, OT, Nursing) required for safe  discharge  - Anticipated need for postoperative skilled nursing care or inpatient rehab  - Active co-morbidities: None OR   - Unanticipated findings during/Post Surgery: Slow post-op progression: GI, pain control, mobility  - Patient is a high risk of re-admission due to: None   Assessment/Plan: 1 Day Post-Op Procedure(s) (LRB): RIGHT TOTAL KNEE ARTHROPLASTY (Right) Advance diet Up with therapy D/C IV fluids Plan for discharge tomorrow home with  hhpt WBAT RLE ABLA-mild and  stable    Kristin Anthony 07/14/2017, 7:10 AM

## 2017-07-14 NOTE — Care Management Note (Signed)
Case Management Note  Patient Details  Name: Idamae SchullerDianne M Keathley MRN: [id number] Date of Birth: 03-18-1950  Subjective/Objective:   67 yr old female s/p right total knee arthroplasty.                 Action/Plan: Case manager spoke with patient concerning discharge plan and DME. Patient was preoperatively setup with [facility], no changes. She says she has RW but needs a 3in1. CM will request from [facility]. Patient will have family support at discharge.    Expected Discharge Date:  07/15/17               Expected Discharge Plan:  Home w Home Health Services  In-House Referral:  NA  Discharge planning Services  CM Consult  Post Acute Care Choice:  Home Health Choice offered to:  Patient  DME Arranged:  3-N-1(Has RW, has declined CPM) DME Agency:  [employer], [facility]  HH Arranged:  PT HH Agency[facility]  [facility]  Status of Service:  Completed, signed off  If discussed at [employer] Length of Stay Meetings, dates discussed:    Additional Comments:  Durenda GuthrieBrady, Cariana Karge Naomi, RN 07/14/2017, 12:49 PM

## 2017-07-14 NOTE — Evaluation (Signed)
Physical Therapy Evaluation Patient Details Name: Kristin SchullerDianne M Campanaro MRN: [id number] DOB: May 05, 1950 Today's Date: 07/14/2017   History of Present Illness  67 y.o. female who presents for surgical treatment of right knee degenerative joint disease. s/p R TKA 07/13/2017. PMH HTN, DM II, DVT, OA  Clinical Impression  Pt is s/p TKA resulting in the deficits listed below (see PT Problem List). Pt currently supervision for bed mobility, minA for transfers and ambulation of 40 feet with RW.  Pt will benefit from skilled PT to increase their independence and safety with mobility to allow discharge to the venue listed below.      Follow Up Recommendations Follow surgeon's recommendation for DC plan and follow-up therapies    Equipment Recommendations  None recommended by PT    Recommendations for Other Services       Precautions / Restrictions Precautions Precautions: Knee Precaution Booklet Issued: Yes (comment) Restrictions Weight Bearing Restrictions: Yes RLE Weight Bearing: Weight bearing as tolerated      Mobility  Bed Mobility Overal bed mobility: Needs Assistance Bed Mobility: Supine to Sit     Supine to sit: Supervision     General bed mobility comments: supervision for safety  Transfers Overall transfer level: Needs assistance Equipment used: Rolling walker (2 wheeled) Transfers: Sit to/from Stand Sit to Stand: Min assist         General transfer comment: requires 3x attempts, vc for bringing hips to EoB, min A for power up and steadying, minr dizziness on standing which quickly cleared  Ambulation/Gait Ambulation/Gait assistance: Min assist Gait Distance (Feet): 40 Feet Assistive device: Rolling walker (2 wheeled) Gait Pattern/deviations: Step-through pattern;Decreased stride length;Decreased weight shift to right;Decreased step length - left;Shuffle;Antalgic Gait velocity: slowed Gait velocity interpretation: <1.8 ft/sec, indicate of risk for recurrent  falls General Gait Details: min A for steadying with gait, vc for increased knee flexion with swing and increased UE use for L LE advancement, as well as cues for proximity to RW      Balance Overall balance assessment: Needs assistance Sitting-balance support: No upper extremity supported;Feet supported Sitting balance-Leahy Scale: Good     Standing balance support: During functional activity;Single extremity supported Standing balance-Leahy Scale: Poor Standing balance comment: reliant on UE support from RW                             Pertinent Vitals/Pain Pain Assessment: 0-10 Pain Score: 5  Pain Location: R knee Pain Descriptors / Indicators: Sore;Aching;Burning Pain Intervention(s): Premedicated before session;Monitored during session;Limited activity within patient's tolerance;Repositioned    Home Living Family/patient expects to be discharged to:: Private residence Living Arrangements: Spouse/significant other Available Help at Discharge: Family;Available 24 hours/day Type of Home: House Home Access: Stairs to enter   [employer] Corporation[employer]trance Stairs-Number of Steps: 3 Home Layout: Multi-level Home Equipment: Walker - 2 wheels;Cane - single point;Hand held shower head;Shower seat      Prior Function Level of Independence: Independent         Comments: driving, community ambulator        Extremity/Trunk Assessment   Upper Extremity Assessment Upper Extremity Assessment: Overall WFL for tasks assessed    Lower Extremity Assessment Lower Extremity Assessment: RLE deficits/detail RLE Deficits / Details: normal post op pain and strength deficits, hip strength 4/5, ankle strength 4/5  RLE Sensation: WNL RLE Coordination: WNL       Communication   Communication: No difficulties  Cognition Arousal/Alertness: Awake/alert Behavior During Therapy: WFL for  tasks assessed/performed Overall Cognitive Status: Within Functional Limits for tasks assessed                                         General Comments General comments (skin integrity, edema, etc.): Son and husband present during treatment, VSS    Exercises Total Joint Exercises Ankle Circles/Pumps: AROM;Both;10 reps Quad Sets: Right;5 reps;AROM;Supine Short Arc Quad: AROM;Right;10 reps;Supine Heel Slides: AROM;Right;10 reps;Supine Straight Leg Raises: AROM;Right;10 reps;Supine   Assessment/Plan    PT Assessment Patient needs continued PT services  PT Problem List Decreased strength;Decreased range of motion;Decreased activity tolerance;Decreased balance;Decreased mobility;Pain       PT Treatment Interventions DME instruction;Gait training;Stair training;Functional mobility training;Therapeutic activities;Therapeutic exercise;Balance training;Patient/family education    PT Goals (Current goals can be found in the Care Plan section)  Acute Rehab PT Goals Patient Stated Goal: get around with less pain PT Goal Formulation: With patient Time For Goal Achievement: 07/28/17 Potential to Achieve Goals: Good    Frequency 7X/week    AM-PAC PT "6 Clicks" Daily Activity  Outcome Measure Difficulty turning over in bed (including adjusting bedclothes, sheets and blankets)?: A Little Difficulty moving from lying on back to sitting on the side of the bed? : A Little Difficulty sitting down on and standing up from a chair with arms (e.g., wheelchair, bedside commode, etc,.)?: Unable Help needed moving to and from a bed to chair (including a wheelchair)?: A Little Help needed walking in hospital room?: A Little Help needed climbing 3-5 steps with a railing? : A Lot 6 Click Score: 15    End of Session Equipment Utilized During Treatment: Gait belt Activity Tolerance: Patient tolerated treatment well Patient left: in chair;with call bell/phone within reach;with family/visitor present Nurse Communication: Mobility status PT Visit Diagnosis: Unsteadiness on feet (R26.81);Other  abnormalities of gait and mobility (R26.89);Muscle weakness (generalized) (M62.81);Difficulty in walking, not elsewhere classified (R26.2);Pain Pain - Right/Left: Right Pain - part of body: Knee    Time: 6962-9528 PT Time Calculation (min) (ACUTE ONLY): 29 min   Charges:   PT Evaluation $PT Eval Low Complexity: 1 Low PT Treatments $Gait Training: 8-22 mins   PT G Codes:        Lauree Yurick B. Beverely Risen PT, DPT Acute Rehabilitation  [phone] Pager [phone]    Elon Alas Fleet 07/14/2017, 9:32 AM

## 2017-07-15 ENCOUNTER — Encounter (INDEPENDENT_AMBULATORY_CARE_PROVIDER_SITE_OTHER): Payer: Self-pay

## 2017-07-15 ENCOUNTER — Telehealth (INDEPENDENT_AMBULATORY_CARE_PROVIDER_SITE_OTHER): Payer: Self-pay

## 2017-07-15 ENCOUNTER — Encounter (HOSPITAL_COMMUNITY): Payer: Self-pay | Admitting: Orthopaedic Surgery

## 2017-07-15 ENCOUNTER — Other Ambulatory Visit (INDEPENDENT_AMBULATORY_CARE_PROVIDER_SITE_OTHER): Payer: Self-pay | Admitting: Physician Assistant

## 2017-07-15 LAB — GLUCOSE, CAPILLARY: Glucose-Capillary: 220 mg/dL — ABNORMAL HIGH (ref 70–99)

## 2017-07-15 MED ORDER — METHOCARBAMOL 750 MG PO TABS: 750.0000 mg | ORAL_TABLET | Freq: Two times a day (BID) | ORAL | 0 refills | Status: DC | PRN

## 2017-07-15 MED ORDER — ONDANSETRON HCL 4 MG PO TABS: 4.0000 mg | ORAL_TABLET | Freq: Three times a day (TID) | ORAL | 0 refills | Status: DC | PRN

## 2017-07-15 MED ORDER — SENNOSIDES-DOCUSATE SODIUM 8.6-50 MG PO TABS: 1.0000 | ORAL_TABLET | Freq: Every evening | ORAL | 1 refills | Status: DC | PRN

## 2017-07-15 MED ORDER — OXYCODONE HCL ER 10 MG PO T12A: 10.0000 mg | EXTENDED_RELEASE_TABLET | Freq: Two times a day (BID) | ORAL | 0 refills | Status: AC

## 2017-07-15 MED ORDER — SULFAMETHOXAZOLE-TRIMETHOPRIM 800-160 MG PO TABS: 1.0000 | ORAL_TABLET | Freq: Two times a day (BID) | ORAL | 0 refills | Status: DC

## 2017-07-15 MED ORDER — OXYCODONE HCL 5 MG PO TABS: 5.0000 mg | ORAL_TABLET | ORAL | 0 refills | Status: DC | PRN

## 2017-07-15 MED ORDER — PROMETHAZINE HCL 25 MG PO TABS: 25.0000 mg | ORAL_TABLET | Freq: Four times a day (QID) | ORAL | 1 refills | Status: DC | PRN

## 2017-07-15 MED ORDER — ASPIRIN EC 81 MG PO TBEC: 81.0000 mg | DELAYED_RELEASE_TABLET | Freq: Two times a day (BID) | ORAL | 0 refills | Status: DC

## 2017-07-15 NOTE — Progress Notes (Signed)
Subjective: 2 Days Post-Op Procedure(s) (LRB): RIGHT TOTAL KNEE ARTHROPLASTY (Right) Patient reports pain as mild.  Doing great this am.  Objective: Vital signs in last 24 hours: Temp:  [98 F (36.7 C)-98.6 F (37 C)] 98.2 F (36.8 C) (07/03 0357) Pulse Rate:  [88-93] 93 (07/03 0357) Resp:  [16-18] 18 (07/03 0357) BP: (118-148)/(57-82) 148/75 (07/03 0357) SpO2:  [93 %-97 %] 97 % (07/03 0357)  Intake/Output from previous day: 07/02 0701 - 07/03 0700 In: 480 [P.O.:480] Out: -  Intake/Output this shift: No intake/output data recorded.  Recent Labs    07/14/17 0404  HGB 11.1*   Recent Labs    07/14/17 0404  WBC 8.8  RBC 3.69*  HCT 34.7*  PLT 151   Recent Labs    07/14/17 0404  NA 133*  K 3.7  CL 104  CO2 26  BUN 10  CREATININE 0.68  GLUCOSE 169*  CALCIUM 7.9*   No results for input(s): LABPT, INR in the last 72 hours.  Neurologically intact Neurovascular intact Sensation intact distally Intact pulses distally Dorsiflexion/Plantar flexion intact Incision: dressing C/D/I No cellulitis present Compartment soft  Anticipated LOS equal to or greater than 2 midnights due to - Age 68 and older with one or more of the following:  - Obesity  - Expected need for hospital services (PT, OT, Nursing) required for safe  discharge  - Anticipated need for postoperative skilled nursing care or inpatient rehab  - Active co-morbidities: Diabetes OR   - Unanticipated findings during/Post Surgery: Slow post-op progression: GI, pain control, mobility  - Patient is a high risk of re-admission due to: None   Assessment/Plan: 2 Days Post-Op Procedure(s) (LRB): RIGHT TOTAL KNEE ARTHROPLASTY (Right) Advance diet Up with therapy D/C IV fluids Discharge home with home health after first session of PT PLEASE PLACE TED HOSE TO RLE PRIOR TO D/C Bandage changed by me today    Cristie HemMary L Stanbery 07/15/2017, 8:12 AM

## 2017-07-15 NOTE — Progress Notes (Signed)
Physical Therapy Treatment Patient Details Name: LAVON HORN MRN: [id number] DOB: 1950/12/15 Today's Date: 07/15/2017    History of Present Illness 67 y.o. female who presents for surgical treatment of right knee degenerative joint disease. s/p R TKA 07/13/2017. PMH HTN, DM II, DVT, OA    PT Comments    POD # 2 Pt dressed and eager to D/C to home.  Spouse present during session for education.  Performed transfers, gait, stair training and HEP.  Instructed on HEP freq, proper tech and use of ICE. Pt advised on "moeration" activity at home as pt is impulsive and may want to do too much.     Follow Up Recommendations  Follow surgeon's recommendation for DC plan and follow-up therapies;Home health PT     Equipment Recommendations  3in1 (PT)(already had a walker)    Recommendations for Other Services       Precautions / Restrictions Precautions Precautions: Knee Restrictions Weight Bearing Restrictions: No RLE Weight Bearing: Weight bearing as tolerated    Mobility  Bed Mobility Overal bed mobility: Modified Independent             General bed mobility comments: able to self perform   Transfers Overall transfer level: Needs assistance Equipment used: Rolling walker (2 wheeled) Transfers: Sit to/from Stand Sit to Stand: Supervision         General transfer comment: good use of hands to steady self < 25% VC's on safety with turns (impulsive)   Ambulation/Gait Ambulation/Gait assistance: Supervision Gait Distance (Feet): 185 Feet Assistive device: Rolling walker (2 wheeled) Gait Pattern/deviations: Step-through pattern;Decreased stride length;Decreased weight shift to right;Decreased step length - left;Shuffle;Antalgic     General Gait Details: good alternating gait with some R knee flexion.  25% VC's on safety, speed, amount of activty as pt present with increased RR and fatigue   Stairs Stairs: Yes Stairs assistance: Supervision;Min guard Stair  Management: Two rails;Forwards;Step to pattern Number of Stairs: 2 General stair comments: performed twice with only one VC on proper sequerncing and safety.  Did instruct pt to have care giver carry walker to top of stairs vs pt place   Wheelchair Mobility    Modified Rankin (Stroke Patients Only)       Balance                                            Cognition Arousal/Alertness: Awake/alert Behavior During Therapy: WFL for tasks assessed/performed Overall Cognitive Status: Within Functional Limits for tasks assessed                                 General Comments: impulsive,   eager to go home,  educated on pacing herself and safety with activity amount      Exercises   Total Knee Replacement TE's 10 reps B LE ankle pumps 10 reps towel squeezes 10 reps knee presses 10 reps heel slides  10 reps SAQ's 10 reps SLR's 10 reps ABD Followed by ICE    General Comments        Pertinent Vitals/Pain Pain Assessment: 0-10 Pain Score: 6  Pain Location: R knee Pain Descriptors / Indicators: Sore;Aching;Burning Pain Intervention(s): Relaxation;Ice applied    Home Living  Prior Function            PT Goals (current goals can now be found in the care plan section)      Frequency    7X/week      PT Plan      Co-evaluation              AM-PAC PT "6 Clicks" Daily Activity  Outcome Measure  Difficulty turning over in bed (including adjusting bedclothes, sheets and blankets)?: A Little Difficulty moving from lying on back to sitting on the side of the bed? : A Little Difficulty sitting down on and standing up from a chair with arms (e.g., wheelchair, bedside commode, etc,.)?: A Little Help needed moving to and from a bed to chair (including a wheelchair)?: A Little Help needed walking in hospital room?: A Little Help needed climbing 3-5 steps with a railing? : A Little 6 Click Score: 18     End of Session Equipment Utilized During Treatment: Gait belt Activity Tolerance: Patient tolerated treatment well Patient left: in chair;with call bell/phone within reach;with family/visitor present Nurse Communication: (pt ready for D/C to home) PT Visit Diagnosis: Unsteadiness on feet (R26.81);Other abnormalities of gait and mobility (R26.89);Muscle weakness (generalized) (M62.81);Difficulty in walking, not elsewhere classified (R26.2);Pain Pain - Right/Left: Right Pain - part of body: Knee     Time: 1010-1039 PT Time Calculation (min) (ACUTE ONLY): 29 min  Charges:  $Gait Training: 8-22 mins $Therapeutic Exercise: 8-22 mins                    G Codes:       Felecia ShellingLori Tamantha Saline  PTA [facility]  Acute  Rehab Pager      [phone]

## 2017-07-15 NOTE — Telephone Encounter (Signed)
See message below. Please send in Rx again. Thank you.

## 2017-07-15 NOTE — Telephone Encounter (Signed)
Put narcotic rx on your desk and sent rest to pharmacy

## 2017-07-15 NOTE — Telephone Encounter (Signed)
RX READY FOR PICK UP,OTHER RX SENT INTO PHARM. PATIENT AWARE.

## 2017-07-15 NOTE — Progress Notes (Signed)
Discharge instructions completed with pt. Pt verbalized understanding of the information.  Pt denies chest pain, shortness of breath, dizziness, lightheadedness, and n/v.  Pt discharged home.  

## 2017-07-15 NOTE — Telephone Encounter (Signed)
Patient called stating that she just got out the hospital and that she lost all of her Rx's that Dr. Roda ShuttersXu prescribed.  Would like to know if Rx's can be called into the pharmacy.  Cb# is [phone].  Please advise.  Thank You.

## 2017-07-15 NOTE — Discharge Summary (Signed)
Patient ID: Kristin Anthony MRN: [id number] DOB/AGE: [birth year] 67 y.o.  Admit date: 07/13/2017 Discharge date: 07/15/2017  Admission Diagnoses:  Active Problems:   Unilateral primary osteoarthritis, right knee   Total knee replacement status   Discharge Diagnoses:  Same  Past Medical History:  Diagnosis Date  . Arthritis   . Depression   . Diabetes mellitus   . DVT (deep venous thrombosis) (HCC)   . High cholesterol   . History of hiatal hernia   . Hypertension     Surgeries: Procedure(s): RIGHT TOTAL KNEE ARTHROPLASTY on 07/13/2017   Consultants:   Discharged Condition: Improved  Hospital Course: Kristin DESCHAMPS is an 67 y.o. female who was admitted 07/13/2017 for operative treatment of primary localized osteoarthritis right knee. Patient has severe unremitting pain that affects sleep, daily activities, and work/hobbies. After pre-op clearance the patient was taken to the operating room on 07/13/2017 and underwent  Procedure(s): RIGHT TOTAL KNEE ARTHROPLASTY.    Patient was given perioperative antibiotics:  Anti-infectives (From admission, onward)   Start     Dose/Rate Route Frequency Ordered Stop   07/13/17 2200  sulfamethoxazole-trimethoprim (BACTRIM DS,SEPTRA DS) 800-160 MG per tablet 1 tablet     1 tablet Oral Every 12 hours 07/13/17 1647     07/13/17 1700  ceFAZolin (ANCEF) IVPB 2g/100 mL premix     2 g 200 mL/hr over 30 Minutes Intravenous Every 6 hours 07/13/17 1647 07/14/17 0531   07/13/17 1138  vancomycin (VANCOCIN) powder  Status:  Discontinued       As needed 07/13/17 1138 07/13/17 1222   07/13/17 0800  ceFAZolin (ANCEF) IVPB 2g/100 mL premix     2 g 200 mL/hr over 30 Minutes Intravenous On call to O.R. 07/13/17 0754 07/13/17 1020   07/13/17 0000  sulfamethoxazole-trimethoprim (BACTRIM DS,SEPTRA DS) 800-160 MG tablet     1 tablet Oral 2 times daily 07/13/17 1152         Patient was given sequential compression devices, early ambulation, and chemoprophylaxis  to prevent DVT.  Patient benefited maximally from hospital stay and there were no complications.    Recent vital signs:  Patient Vitals for the past 24 hrs:  BP Temp Temp src Pulse Resp SpO2  07/15/17 0357 (!) 148/75 98.2 F (36.8 C) Oral 93 18 97 %  07/14/17 1939 132/82 98 F (36.7 C) Oral 92 16 93 %  07/14/17 1358 (!) 118/57 98.6 F (37 C) Oral 88 16 94 %     Recent laboratory studies:  Recent Labs    07/14/17 0404  WBC 8.8  HGB 11.1*  HCT 34.7*  PLT 151  NA 133*  K 3.7  CL 104  CO2 26  BUN 10  CREATININE 0.68  GLUCOSE 169*  CALCIUM 7.9*     Discharge Medications:   Allergies as of 07/15/2017      Reactions   Codeine Nausea And Vomiting      Medication List    STOP taking these medications   ibuprofen 200 MG tablet Commonly known as:  ADVIL,MOTRIN     TAKE these medications   amLODipine 10 MG tablet Commonly known as:  NORVASC Take 10 mg by mouth at bedtime.   aspirin EC 81 MG tablet Take 1 tablet (81 mg total) by mouth 2 (two) times daily. What changed:  when to take this   atorvastatin 40 MG tablet Commonly known as:  LIPITOR Take 40 mg by mouth at bedtime.   escitalopram 20 MG tablet Commonly known  asJudye Bos:  LEXAPRO Take 20 mg by mouth daily.   HUMALOG KWIKPEN 100 UNIT/ML KiwkPen Generic drug:  insulin lispro Inject 12 Units into the skin 2 (two) times daily.   JANUVIA 100 MG tablet Generic drug:  sitaGLIPtin Take 100 mg by mouth.   methocarbamol 750 MG tablet Commonly known as:  ROBAXIN Take 1 tablet (750 mg total) by mouth 2 (two) times daily as needed for muscle spasms.   olmesartan 40 MG tablet Commonly known as:  BENICAR Take 40 mg by mouth daily.   ondansetron 4 MG tablet Commonly known as:  ZOFRAN Take 1-2 tablets (4-8 mg total) by mouth every 8 (eight) hours as needed for nausea or vomiting.   oxyCODONE 5 MG immediate release tablet Commonly known as:  Oxy IR/ROXICODONE Take 1-3 tablets (5-15 mg total) by mouth every 4 (four)  hours as needed.   oxyCODONE 10 mg 12 hr tablet Commonly known as:  OXYCONTIN Take 1 tablet (10 mg total) by mouth every 12 (twelve) hours for 3 days.   promethazine 25 MG tablet Commonly known as:  PHENERGAN Take 1 tablet (25 mg total) by mouth every 6 (six) hours as needed for nausea.   senna-docusate 8.6-50 MG tablet Commonly known as:  SENOKOT S Take 1 tablet by mouth at bedtime as needed.   sulfamethoxazole-trimethoprim 800-160 MG tablet Commonly known as:  BACTRIM DS,SEPTRA DS Take 1 tablet by mouth 2 (two) times daily.   TOUJEO SOLOSTAR 300 UNIT/ML Sopn Generic drug:  Insulin Glargine Inject 32 Units into the skin 2 (two) times daily.            Durable Medical Equipment  (From admission, onward)        Start     Ordered   07/13/17 1648  DME Walker rolling  Once    Question:  Patient needs a walker to treat with the following condition  Answer:  Total knee replacement status   07/13/17 1647   07/13/17 1648  DME 3 n 1  Once     07/13/17 1647   07/13/17 1648  DME Bedside commode  Once    Question:  Patient needs a bedside commode to treat with the following condition  Answer:  Total knee replacement status   07/13/17 1647      Diagnostic Studies: Dg Chest 2 View  Result Date: 07/02/2017 CLINICAL DATA:  Preoperative evaluation for right knee replacement EXAM: CHEST - 2 VIEW COMPARISON:  10/20/2015 FINDINGS: Cardiac shadow is within normal limits. The lungs are well aerated bilaterally. Degenerative changes of the thoracic spine are seen. No acute abnormality is noted. IMPRESSION: No active cardiopulmonary disease. Electronically Signed   By: Alcide CleverMark  Lukens M.D.   On: 07/02/2017 10:41   Ct Abdomen Pelvis W Contrast  Result Date: 06/19/2017 CLINICAL DATA:  Upper abdominal pain and nausea EXAM: CT ABDOMEN AND PELVIS WITH CONTRAST TECHNIQUE: Multidetector CT imaging of the abdomen and pelvis was performed using the standard protocol following bolus administration of  intravenous contrast. CONTRAST:  100mL ISOVUE-300 IOPAMIDOL (ISOVUE-300) INJECTION 61% COMPARISON:  None. FINDINGS: Lower chest: There is patchy lower lobe atelectatic change. There is no lung base edema or consolidation evident. Hepatobiliary: Liver measures 19.5 cm in length. No focal liver lesions are evident. Gallbladder wall is not appreciably thickened. There is no biliary duct dilatation. Pancreas: There is no pancreatic mass or inflammatory focus. Spleen: No splenic lesions are evident. Adrenals/Urinary Tract: Left adrenal appears unremarkable. There is a right adrenal mass measuring 1.6 x  1.1 cm which has attenuation value placing this lesion in an indeterminate category. Kidneys bilaterally show no evident mass or hydronephrosis on either side. There is no evident renal or ureteral calculus on either side. Urinary bladder is midline with wall thickness within normal limits. Stomach/Bowel: There is no appreciable bowel wall or mesenteric thickening. There is moderate stool in the colon. There is no evident bowel obstruction. No free air or portal venous air. Vascular/Lymphatic: There is atherosclerotic calcification in the aorta and common iliac arteries. No evident aneurysm. Major mesenteric vessels appear patent. There is no appreciable adenopathy in the abdomen or pelvis. Reproductive: Uterus is absent.  There is no evident pelvic mass. Other: Appendix is absent. No abscess or ascites is appreciable in the abdomen or pelvis. Musculoskeletal: There is postoperative change in the lower lumbar spine at L4 and L5. There is osteoarthritic change throughout much of the lumbar spine. There are no blastic or lytic bone lesions. There is no intramuscular or abdominal wall lesion. IMPRESSION: 1. No bowel obstruction or bowel wall thickening evident. No abscess. Appendix absent. 2.  No renal or ureteral calculus.  No hydronephrosis. 3. Small right adrenal mass measuring 1.6 x 1.1 cm, considered indeterminate. This  finding may warrant a follow-up study in 1 year to assess for stability. 4.  Prominent liver without focal liver lesion. 5.  Aortoiliac atherosclerosis. 6.  Postoperative change lower lumbar spine. 7.  Small hiatal hernia. Aortic Atherosclerosis (ICD10-I70.0). Electronically Signed   By: Bretta Bang III M.D.   On: 06/19/2017 10:26   Dg Knee Right Port  Result Date: 07/13/2017 CLINICAL DATA:  RIGHT total knee arthroplasty. EXAM: PORTABLE RIGHT KNEE - 1-2 VIEW COMPARISON:  None. FINDINGS: RIGHT total knee arthroplasty changes identified. No definite complicating features are noted. IMPRESSION: RIGHT total knee arthroplasty without complicating features identified. Electronically Signed   By: Harmon Pier M.D.   On: 07/13/2017 15:55   Xr Knee 3 View Left  Result Date: 06/23/2017 Moderate osteoarthritis and chondrocalcinosis  Xr Knee 3 View Right  Result Date: 06/23/2017 Significant joint space narrowing consistent with degenerative joint disease  US Abdomen Limited Ruq  Result Date: 06/19/2017 CLINICAL DATA:  Right upper quadrant pain EXAM: ULTRASOUND ABDOMEN LIMITED RIGHT UPPER QUADRANT COMPARISON:  07/18/2013 FINDINGS: Gallbladder: No gallstones or wall thickening visualized. No sonographic Murphy sign noted by sonographer. Common bile duct: Diameter: Normal caliber, 6 mm Liver: Increased echotexture compatible with fatty infiltration. No focal abnormality or biliary ductal dilatation. Portal vein is patent on color Doppler imaging with normal direction of blood flow towards the liver. IMPRESSION: Fatty infiltration of the liver. No acute findings. Electronically Signed   By: Charlett Nose M.D.   On: 06/19/2017 09:00    Disposition: Discharge disposition: 01-Home or Self Care         Follow-up Information    Tarry Kos, MD In 2 weeks.   Specialty:  Orthopedic Surgery Why:  For suture removal, For wound re-check Contact information: [address] [place]  [address] [phone]        [facility] Follow up.   Specialty:  Home Health Services Why:  A representative from [facility] will contact you to arrange start date and time for your therapy. Contact information: [address] [place] [address] [phone]            Signed: Cristie Hem 07/15/2017, 8:13 AM

## 2017-07-17 ENCOUNTER — Telehealth (INDEPENDENT_AMBULATORY_CARE_PROVIDER_SITE_OTHER): Payer: Self-pay | Admitting: Orthopaedic Surgery

## 2017-07-17 NOTE — Telephone Encounter (Signed)
Kip, PT, with [facility] left a message [facility] the following:  1x a week for 1 week 3x a week for 2 weeks 2x a week for 2 weeks  Also she stated that the patient was not given any after care instructions in regards to her dressing change.  Does she need to leave the dressing on until her next Dr's appointment or are they suppose to change it after so many days.  CB#[phone].  Thank you.

## 2017-07-17 NOTE — Telephone Encounter (Signed)
Please advise 

## 2017-07-19 NOTE — Telephone Encounter (Signed)
yes

## 2017-07-20 ENCOUNTER — Telehealth (INDEPENDENT_AMBULATORY_CARE_PROVIDER_SITE_OTHER): Payer: Self-pay | Admitting: Orthopaedic Surgery

## 2017-07-20 NOTE — Telephone Encounter (Signed)
LMOM for Kip. Advise bandages after surgery are typically left on until post op visit, unless dressing becomes saturated and can apply dry dressing.

## 2017-07-20 NOTE — Telephone Encounter (Signed)
yes

## 2017-07-20 NOTE — Telephone Encounter (Signed)
Kit from [facility] called this morning requesting VO for the following:  PT  1x week for 1 week 3x week for 2 weeks 2x a week for 2 weeks.  Starting July 4th  Also does the patient leave the bandage on the her knee or does the patient need to remove it after so many days.    CB#[phone]  Thank you.

## 2017-07-20 NOTE — Telephone Encounter (Signed)
See message below. Is this okay?  

## 2017-07-20 NOTE — Telephone Encounter (Signed)
Called kit to advise.

## 2017-07-30 ENCOUNTER — Encounter (INDEPENDENT_AMBULATORY_CARE_PROVIDER_SITE_OTHER): Payer: Self-pay | Admitting: Orthopaedic Surgery

## 2017-07-30 ENCOUNTER — Other Ambulatory Visit (INDEPENDENT_AMBULATORY_CARE_PROVIDER_SITE_OTHER): Payer: Self-pay

## 2017-07-30 ENCOUNTER — Ambulatory Visit (INDEPENDENT_AMBULATORY_CARE_PROVIDER_SITE_OTHER): Payer: Managed Care, Other (non HMO) | Admitting: Orthopaedic Surgery

## 2017-07-30 VITALS — Ht 66.0 in | Wt 209.0 lb

## 2017-07-30 DIAGNOSIS — M1711 Unilateral primary osteoarthritis, right knee: Secondary | ICD-10-CM

## 2017-07-30 MED ORDER — HYDROCODONE-ACETAMINOPHEN 7.5-325 MG PO TABS: 1.0000 | ORAL_TABLET | Freq: Two times a day (BID) | ORAL | 0 refills | Status: DC | PRN

## 2017-07-30 MED ORDER — METHOCARBAMOL 500 MG PO TABS: 500.0000 mg | ORAL_TABLET | Freq: Four times a day (QID) | ORAL | 2 refills | Status: DC | PRN

## 2017-07-30 NOTE — Progress Notes (Signed)
Patient is two-week status post right total knee replacement.  She is doing well and progressing with home physical therapy.  Her incision is healed without any signs of infection.  Range of motion 0 to 90 degrees.  Pain is well controlled with hydrocodone.  She has expected postoperative swelling.  At this point we will refer her to outpatient physical therapy.  Robaxin and Norco were refilled.  Precautions were discussed.  Recheck in 4 weeks with three-view x-rays of the right knee.

## 2017-08-04 ENCOUNTER — Inpatient Hospital Stay (INDEPENDENT_AMBULATORY_CARE_PROVIDER_SITE_OTHER): Payer: Managed Care, Other (non HMO) | Admitting: Orthopaedic Surgery

## 2017-08-04 ENCOUNTER — Other Ambulatory Visit (INDEPENDENT_AMBULATORY_CARE_PROVIDER_SITE_OTHER): Payer: Self-pay | Admitting: Orthopaedic Surgery

## 2017-08-04 MED ORDER — DICLOFENAC SODIUM 1 % TD GEL: 2.0000 g | Freq: Four times a day (QID) | TRANSDERMAL | 5 refills | Status: DC

## 2017-08-06 ENCOUNTER — Ambulatory Visit: Payer: Managed Care, Other (non HMO) | Attending: Orthopaedic Surgery | Admitting: Physical Therapy

## 2017-08-06 ENCOUNTER — Encounter: Payer: Self-pay | Admitting: Physical Therapy

## 2017-08-06 ENCOUNTER — Other Ambulatory Visit: Payer: Self-pay

## 2017-08-06 DIAGNOSIS — M6281 Muscle weakness (generalized): Secondary | ICD-10-CM

## 2017-08-06 DIAGNOSIS — M25561 Pain in right knee: Secondary | ICD-10-CM

## 2017-08-06 DIAGNOSIS — R262 Difficulty in walking, not elsewhere classified: Secondary | ICD-10-CM

## 2017-08-06 DIAGNOSIS — M25661 Stiffness of right knee, not elsewhere classified: Secondary | ICD-10-CM

## 2017-08-06 NOTE — Therapy (Signed)
[facility] [name]  [address] [place], [place], [address] Phone: [phone]   Fax:  [phone]  Physical Therapy Evaluation  Patient Details  Name: Kristin SchullerDianne M Anthony MRN: [id number] Date of Birth: 09/15/1950 Referring Provider: Gershon MusselNaiping Xu, MD   Encounter Date: 08/06/2017  PT End of Session - 08/06/17 0837    Visit Number  1    Number of Visits  7    Date for PT Re-Evaluation  09/24/17    Authorization Type  [employer]    PT Start Time  0755    PT Stop Time  0829    PT Time Calculation (min)  34 min    Activity Tolerance  Patient tolerated treatment well;Patient limited by pain    Behavior During Therapy  [facility] for tasks assessed/performed       Past Medical History:  Diagnosis Date  . Arthritis   . Depression   . Diabetes mellitus   . DVT (deep venous thrombosis) (HCC)   . High cholesterol   . History of hiatal hernia   . Hypertension     Past Surgical History:  Procedure Laterality Date  . ABDOMINAL HYSTERECTOMY    . APPENDECTOMY    . BACK SURGERY    . KNEE SURGERY    . TOTAL KNEE ARTHROPLASTY Right 07/13/2017  . TOTAL KNEE ARTHROPLASTY Right 07/13/2017   Procedure: RIGHT TOTAL KNEE ARTHROPLASTY;  Surgeon: Tarry KosXu, Naiping M, MD;  Location: [facility] OR;  Service: Orthopedics;  Laterality: Right;    There were no vitals filed for this visit.   Subjective Assessment - 08/06/17 0756    Subjective  Patient reports she underwent R TKA on 07/13/17. Had [facility] PT for 3 weeks- worked on ROM. Was using a rolling walker post-op now ambulating without AD. Reports precautions include no driving, no soaking incision, FWBing. Using step-to gait pattern to go up and down stairs. Difficulty with sit to stand transfers- would like to be able to perform without UE support.      Pertinent History  arthritis, depression, DM, hx DVT, HLD, hiatal hernia, HTN, back surgery    Limitations  Sitting;Standing;Writing;House hold  activities;Lifting;Walking    How long can you sit comfortably?  30min - 1 hour    How long can you stand comfortably?  30 min-1 hour    How long can you walk comfortably?  20 minutes    Diagnostic tests  06/23/17 R knee xray: Significant joint space narrowing consistent with degenerative joint disease. 07/13/17 R knee xray: RIGHT total knee arthroplasty without complicating features identified.    Patient Stated Goals  get better at bending this knee especially when walking    Currently in Pain?  Yes    Pain Score  5  4.5/10    Pain Location  Knee    Pain Orientation  Right    Pain Descriptors / Indicators  Aching;Dull    Pain Type  Acute pain;Surgical pain    Aggravating Factors   prolonged sitting/standing/walking, getting up and down from toilet    Pain Relieving Factors  meds, ice         [facility] PT Assessment - 08/06/17 0808      Assessment   Medical Diagnosis  Unilateral primary OA- R knee    Referring Provider  Gershon MusselNaiping Xu, MD    Onset Date/Surgical Date  07/13/17    Next MD Visit  08/25/17    Prior Therapy  Yes- HH PT and OP for  back      Precautions   Precautions  Knee    Precaution Comments  no driving, water immersion, lotions      Restrictions   Weight Bearing Restrictions  -- FWBing      Balance Screen   Has the patient fallen in the past 6 months  No    Has the patient had a decrease in activity level because of a fear of falling?   No    Is the patient reluctant to leave their home because of a fear of falling?   No      Home Nurse, mental health  Private residence    Living Arrangements  Spouse/significant other    Available Help at Discharge  Family    Type of Home  House    Home Access  Stairs to enter    Entrance Stairs-Number of Steps  2    Entrance Stairs-Rails  Right;Left    Home Layout  Two level    Alternate Level Stairs-Number of Steps  5    Alternate Level Stairs-Rails  Right;Left    Home Equipment  Walker - 2 wheels;Toilet riser       Prior Function   Level of Independence  Independent    Vocation  On disability on disability for her back    Leisure  driving      Cognition   Overall Cognitive Status  Within Functional Limits for tasks assessed      Observation/Other Assessments   Focus on Therapeutic Outcomes (FOTO)   Knee: 34 (66% limited, 52% predicted)      Sensation   Light Touch  Appears Intact      Coordination   Gross Motor Movements are Fluid and Coordinated  Yes      Posture/Postural Control   Posture/Postural Control  Postural limitations    Postural Limitations  Rounded Shoulders;Weight shift left      ROM / Strength   AROM / PROM / Strength  Strength;PROM;AROM      AROM   AROM Assessment Site  Knee    Right/Left Knee  Left;Right    Right Knee Extension  8    Right Knee Flexion  97    Left Knee Extension  0    Left Knee Flexion  126      PROM   PROM Assessment Site  Knee    Right/Left Knee  Left;Right    Right Knee Extension  6    Right Knee Flexion  100    Left Knee Extension  -2    Left Knee Flexion  130      Strength   Strength Assessment Site  Hip;Knee;Ankle    Right/Left Hip  Right;Left    Right Hip Flexion  4/5    Right Hip ABduction  4/5    Right Hip ADduction  4/5    Left Hip Flexion  4/5    Left Hip ABduction  4/5    Left Hip ADduction  4/5    Right/Left Knee  Right;Left    Right Knee Flexion  4/5    Right Knee Extension  4-/5    Left Knee Flexion  4+/5    Left Knee Extension  4/5    Right/Left Ankle  Right;Left    Right Ankle Dorsiflexion  4+/5    Right Ankle Plantar Flexion  4+/5    Left Ankle Dorsiflexion  4+/5    Left Ankle Plantar Flexion  4+/5  Palpation   Palpation comment  mildly edematous; incision well-healingg      Ambulation/Gait   Assistive device  None    Gait Pattern  Step-through pattern;Decreased weight shift to right;Decreased hip/knee flexion - right;Decreased step length - left;Antalgic    Gait velocity  slightly decreased                 Objective measurements completed on examination: See above findings.              PT Education - 08/06/17 0836    Education Details  prognosis, POC, HEP    Person(s) Educated  Patient    Methods  Explanation;Demonstration;Tactile cues;Verbal cues;Handout    Comprehension  Returned demonstration;Verbalized understanding       PT Short Term Goals - 08/06/17 0844      PT SHORT TERM GOAL #1   Title  Patient to be independent with initial HEP.    Time  3    Period  Weeks    Status  New    Target Date  08/27/17        PT Long Term Goals - 08/06/17 0846      PT LONG TERM GOAL #1   Title  Patient to be independent with advanced HEP.    Time  6    Period  Weeks    Status  New    Target Date  09/17/17      PT LONG TERM GOAL #2   Title  Patient to demonstrate R knee AROM/PROM from 0-120 degrees.    Time  6    Period  Weeks    Status  New    Target Date  09/17/17      PT LONG TERM GOAL #3   Title  Patient to demonstrate B LE strength >=4+/5.    Time  6    Period  Weeks    Status  New    Target Date  09/17/17      PT LONG TERM GOAL #4   Title  Patient to demonstrate reciprocal stair climbing up 13 steps with 1 handrail with good eccentric control and without pain limiting.     Time  6    Period  Weeks    Status  New    Target Date  09/17/17      PT LONG TERM GOAL #5   Title  Patient to demonstrate gait pattern with equal step length, weight shift, and knee flexion/extension without antalgia.    Time  6    Period  Weeks    Status  New    Target Date  09/17/17             Plan - 08/06/17 0837    Clinical Impression Statement  Patient is a 67y/o F presenting to OPPT after R TKA on 07/13/17. Underwent HH PT for 3 weeks. Reports ambulating with rolling walker post-op, now without AD. Able to recall most surgical precautions and reminded of additional ones. Patient reports feeling stiff with ambulation and difficulty with stairs.  Patient today with limited and painful R knee AROM/PROM, B LE weakness, and gait deviations. Patient with report of discomfort after PROM into R knee flexion but denied pain. Declined additional testing after this. Patient educated on gentle HEP for ROM and strengthening; educated patient on prone quad stretch with sturdy strap, patient reporting she if fearful of this exercise. Deferred at this time. Received HEP handout and reported understanding. Would benefit from skilled PT  services 1x/week for 6 weeks to address aforementioned impairments.     Clinical Presentation  Stable    Clinical Presentation due to:  arthritis, depression, DM, hx DVT, HLD, hiatal hernia, HTN, back surgery    Clinical Decision Making  Low    Rehab Potential  Good    PT Frequency  1x / week    PT Duration  6 weeks    PT Treatment/Interventions  ADLs/Self Care Home Management;Cryotherapy;Electrical Stimulation;Moist Heat;Ultrasound;DME Instruction;Gait training;Stair training;Functional mobility training;Therapeutic activities;Therapeutic exercise;Manual techniques;Patient/family education;Neuromuscular re-education;Balance training;Scar mobilization;Passive range of motion;Dry needling;Energy conservation;Splinting;Taping;Vasopneumatic Device    PT Next Visit Plan  reassess HEP    Consulted and Agree with Plan of Care  Patient       Patient will benefit from skilled therapeutic intervention in order to improve the following deficits and impairments:  Decreased endurance, Decreased activity tolerance, Decreased strength, Pain, Decreased balance, Decreased mobility, Difficulty walking, Decreased range of motion, Impaired flexibility, Postural dysfunction, Improper body mechanics  Visit Diagnosis: Acute pain of right knee  Stiffness of right knee, not elsewhere classified  Muscle weakness (generalized)  Difficulty in walking, not elsewhere classified     Problem List Patient Active Problem List   Diagnosis Date  Noted  . 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 Unilateral primary osteoarthritis, left knee 10/07/2016  . Chronic pain of both knees 10/07/2016  . Unilateral primary osteoarthritis, right knee 03/18/2016  . Closed fracture of tuft of distal phalanx of right thumb 08/31/2012     Anette Guarneri, PT, DPT 08/06/17 8:51 AM   [facility] [address]  [address] [place], [place], [address] Phone: [phone]   Fax:  [phone]  Name: Kristin Anthony MRN: [id number] Date of Birth: January 14, 1950

## 2017-08-09 ENCOUNTER — Ambulatory Visit (HOSPITAL_BASED_OUTPATIENT_CLINIC_OR_DEPARTMENT_OTHER)
Admission: RE | Admit: 2017-08-09 | Discharge: 2017-08-09 | Disposition: A | Discharge status: Home / Self Care | Payer: Managed Care, Other (non HMO) | Source: Ambulatory Visit | Attending: Emergency Medicine | Admitting: Emergency Medicine

## 2017-08-09 ENCOUNTER — Emergency Department (HOSPITAL_BASED_OUTPATIENT_CLINIC_OR_DEPARTMENT_OTHER)
Admission: EM | Admit: 2017-08-09 | Discharge: 2017-08-09 | Disposition: A | Discharge status: Home / Self Care | Payer: Managed Care, Other (non HMO) | Attending: Emergency Medicine | Admitting: Emergency Medicine

## 2017-08-09 ENCOUNTER — Other Ambulatory Visit: Payer: Self-pay

## 2017-08-09 ENCOUNTER — Encounter (HOSPITAL_BASED_OUTPATIENT_CLINIC_OR_DEPARTMENT_OTHER): Payer: Self-pay | Admitting: *Deleted

## 2017-08-09 DIAGNOSIS — Z86718 Personal history of other venous thrombosis and embolism: Secondary | ICD-10-CM | POA: Insufficient documentation

## 2017-08-09 DIAGNOSIS — E119 Type 2 diabetes mellitus without complications: Secondary | ICD-10-CM | POA: Diagnosis not present

## 2017-08-09 DIAGNOSIS — I10 Essential (primary) hypertension: Secondary | ICD-10-CM | POA: Diagnosis not present

## 2017-08-09 DIAGNOSIS — Z7982 Long term (current) use of aspirin: Secondary | ICD-10-CM | POA: Diagnosis not present

## 2017-08-09 DIAGNOSIS — Z794 Long term (current) use of insulin: Secondary | ICD-10-CM | POA: Diagnosis not present

## 2017-08-09 DIAGNOSIS — Z79899 Other long term (current) drug therapy: Secondary | ICD-10-CM | POA: Diagnosis not present

## 2017-08-09 DIAGNOSIS — M79662 Pain in left lower leg: Secondary | ICD-10-CM | POA: Insufficient documentation

## 2017-08-09 DIAGNOSIS — M79605 Pain in left leg: Secondary | ICD-10-CM | POA: Insufficient documentation

## 2017-08-09 DIAGNOSIS — Z96651 Presence of right artificial knee joint: Secondary | ICD-10-CM | POA: Insufficient documentation

## 2017-08-09 MED ORDER — OXYCODONE-ACETAMINOPHEN 5-325 MG PO TABS
1.0000 | ORAL_TABLET | Freq: Once | ORAL | Status: AC
  Administered 2017-08-09: 1 via ORAL
  Filled 2017-08-09: qty 1

## 2017-08-09 NOTE — Discharge Instructions (Addendum)
You were seen today for left lower quadrant pain.  It is somewhat atypical.  It may be related to compensatory pain from your recent right knee replacement.  However, your surgery does put you at increased risk for blood clot.  You will be scheduled for an ultrasound later today.

## 2017-08-09 NOTE — ED Provider Notes (Signed)
[facility] EMERGENCY DEPARTMENT Provider Note   CSN: [id number] Arrival date & time: 08/09/17  0557     History   Chief Complaint Chief Complaint  Patient presents with  . Leg Pain    HPI Kristin Anthony is a 67 y.o. female.  HPI  This is a 67 year old female with a history of diabetes, DVT, hypertension who presents with left leg pain.  Patient reports that she had a right knee replacement on July 1.  She states that she had been doing well.  However, she has had worsening left leg pain.  Mostly in her left upper leg and thigh but also going into her left calf.  It is similar to the pain that she had when she had a blood clot a proximally 1 year ago.  She is not currently on anticoagulation.  She reports that she was only on anticoagulation for "like 2 weeks."  She describes the pain is sharp.  Current pain is 7 out of 10.  Pain is not helped with hydrocodone for which she is taking for her knee replacement.  She denies any numbness or tingling of the leg.  She denies back pain or weakness.  She has not noted any fevers.  Denies chest pain or shortness of breath.  I do not see any ultrasound imaging from one year ago to indicate DVT.  She has had 2 prior ultrasounds that appear neg1 from 2017 and one from 2010.  She reports that last years study was as an outpatient.  Past Medical History:  Diagnosis Date  . Arthritis   . Depression   . Diabetes mellitus   . DVT (deep venous thrombosis) (HCC)   . High cholesterol   . History of hiatal hernia   . Hypertension     Patient Active Problem List   Diagnosis Date Noted  . 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 Unilateral primary osteoarthritis, left knee 10/07/2016  . Chronic pain of both knees 10/07/2016  . Unilateral primary osteoarthritis, right knee 03/18/2016  . Closed fracture of tuft of distal phalanx of right thumb 08/31/2012    Past Surgical History:  Procedure Laterality Date  . ABDOMINAL  HYSTERECTOMY    . APPENDECTOMY    . BACK SURGERY    . KNEE SURGERY    . TOTAL KNEE ARTHROPLASTY Right 07/13/2017  . TOTAL KNEE ARTHROPLASTY Right 07/13/2017   Procedure: RIGHT TOTAL KNEE ARTHROPLASTY;  Surgeon: Tarry Kos, MD;  Location: [facility] OR;  Service: Orthopedics;  Laterality: Right;     OB History   None      Home Medications    Prior to Admission medications   Medication Sig Start Date End Date Taking? Authorizing Provider  amLODipine (NORVASC) 10 MG tablet Take 10 mg by mouth at bedtime.    [provider]  aspirin EC 81 MG tablet Take 1 tablet (81 mg total) by mouth 2 (two) times daily. 07/15/17   Cristie Hem, PA-C  atorvastatin (LIPITOR) 40 MG tablet Take 40 mg by mouth at bedtime.    [provider]  diclofenac sodium (VOLTAREN) 1 % GEL Apply 2 g topically 4 (four) times daily. 08/04/17   Tarry Kos, MD  escitalopram (LEXAPRO) 20 MG tablet Take 20 mg by mouth daily.    [provider]  HYDROcodone-acetaminophen (NORCO) 7.5-325 MG tablet Take 1-2 tablets by mouth 2 (two) times daily as needed for moderate pain. 07/30/17   Tarry Kos, MD  insulin  lispro (HUMALOG KWIKPEN) 100 UNIT/ML KiwkPen Inject 12 Units into the skin 2 (two) times daily.    [provider]  JANUVIA 100 MG tablet Take 100 mg by mouth.  03/13/16   [provider]  methocarbamol (ROBAXIN) 500 MG tablet Take 1 tablet (500 mg total) by mouth every 6 (six) hours as needed for muscle spasms. 07/30/17   Tarry Kos, MD  methocarbamol (ROBAXIN) 750 MG tablet Take 1 tablet (750 mg total) by mouth 2 (two) times daily as needed for muscle spasms. 07/15/17   Cristie Hem, PA-C  olmesartan (BENICAR) 40 MG tablet Take 40 mg by mouth daily.    [provider]  ondansetron (ZOFRAN) 4 MG tablet Take 1-2 tablets (4-8 mg total) by mouth every 8 (eight) hours as needed for nausea or vomiting. 07/15/17   Cristie Hem, PA-C  oxyCODONE (OXY IR/ROXICODONE) 5 MG  immediate release tablet Take 1-3 tablets (5-15 mg total) by mouth every 4 (four) hours as needed. 07/15/17   Cristie Hem, PA-C  promethazine (PHENERGAN) 25 MG tablet Take 1 tablet (25 mg total) by mouth every 6 (six) hours as needed for nausea. Patient not taking: Reported on 08/06/2017 07/15/17   Cristie Hem, PA-C  senna-docusate (SENOKOT S) 8.6-50 MG tablet Take 1 tablet by mouth at bedtime as needed. Patient not taking: Reported on 08/06/2017 07/15/17   Cristie Hem, PA-C  sulfamethoxazole-trimethoprim (BACTRIM DS,SEPTRA DS) 800-160 MG tablet Take 1 tablet by mouth 2 (two) times daily. Patient not taking: Reported on 08/06/2017 07/15/17   Cristie Hem, PA-C  TOUJEO SOLOSTAR 300 UNIT/ML SOPN Inject 32 Units into the skin 2 (two) times daily.  11/21/15   [provider]    Family History Family History  Problem Relation Age of Onset  . Heart attack Mother   . Diabetes Father   . Hypertension Father   . Hypertension Sister   . Diabetes Sister   . Hypertension Brother   . Diabetes Brother   . Hyperlipidemia Neg Hx   . Sudden death Neg Hx     Social History Social History   Tobacco Use  . Smoking status: Never Smoker  . Smokeless tobacco: Never Used  Substance Use Topics  . Alcohol use: No  . Drug use: No     Allergies   Codeine   Review of Systems Review of Systems  Constitutional: Negative for fever.  Respiratory: Negative for shortness of breath.   Cardiovascular: Negative for chest pain and leg swelling.  Gastrointestinal: Negative for abdominal pain.  Genitourinary: Negative for dysuria.  Musculoskeletal:       Left leg pain  Neurological: Negative for weakness and numbness.  All other systems reviewed and are negative.    Physical Exam Updated Vital Signs BP (!) 147/86 (BP Location: Right Arm)   Pulse 93   Temp 99.8 F (37.7 C) (Oral)   Resp 16   Ht 5\' 6"  (1.676 m)   Wt 85.7 kg (189 lb)   SpO2 97%   BMI 30.51 kg/m   Physical Exam    Constitutional: She is oriented to person, place, and time. She appears well-developed and well-nourished.  HENT:  Head: Normocephalic and atraumatic.  Neck: Neck supple.  Cardiovascular: Normal rate, regular rhythm and normal heart sounds.  Pulmonary/Chest: Effort normal and breath sounds normal. No respiratory distress. She has no wheezes.  Abdominal: Soft. There is no tenderness.  Musculoskeletal: She exhibits no edema.  Tenderness to palpation over the  posterior left thigh and calf, no palpable cords, no overlying skin changes or erythema, no significant warmth, 2+ DP pulse, normal range of motion left knee, right knee with well-healing vertical incision, incision is clean dry and intact  Neurological: She is alert and oriented to person, place, and time.  Skin: Skin is warm and dry.  Psychiatric: She has a normal mood and affect.  Nursing note and vitals reviewed.    ED Treatments / Results  Labs (all labs ordered are listed, but only abnormal results are displayed) Labs Reviewed - No data to display  EKG None  Radiology No results found.  Procedures Procedures (including critical care time)  Medications Ordered in ED Medications  oxyCODONE-acetaminophen (PERCOCET/ROXICET) 5-325 MG per tablet 1 tablet (has no administration in time range)     Initial Impression / Assessment and Plan / ED Course  I have reviewed the triage vital signs and the nursing notes.  Pertinent labs & imaging results that were available during my care of the patient were reviewed by me and considered in my medical decision making (see chart for details).     She presents with left leg pain.  This is in the setting of recent surgery.  She states it feels similar to prior DVT.  I cannot from presence of prior DVT but patient reports history.  She is tender without asymmetric swelling or overlying skin changes.  No back pain to suggest sciatica.  Some of her symptoms may be related to favoring  that leg with recent knee replacement.  However, it would be prudent to rule out DVT.  Do not have ultrasound at this time.  They will be available in 2 hours.  Patient does not want to wait.  She wishes to come back for ultrasound imaging.  She was given Percocet for pain.  Will schedule ultrasound.  Given that she does not have any chest pain or shortness of breath, do not feel she needs any work-up at this time and can return safely for ultrasound in 2 hours.  After history, exam, and medical workup I feel the patient has been appropriately medically screened and is safe for discharge home. Pertinent diagnoses were discussed with the patient. Patient was given return precautions.   Final Clinical Impressions(s) / ED Diagnoses   Final diagnoses:  Left leg pain    ED Discharge Orders        Ordered    US Venous Img Lower Unilateral Left     08/09/17 0646       Shon BatonHorton, Courtney F, MD 08/09/17 [phone]

## 2017-08-09 NOTE — ED Triage Notes (Signed)
Here to be checked for a blood clot in the LLE. c/o leg pain from L hip to L knee. Mentions recent surgery on 6/1, and h/o DVT. C/o pain only. Denies: injury, fall, fever, numbness, tingling, other pain, sob, NVD, dizziness, weakness or other sx. No obvious swelling, bruising, heat or redness. Pedal pulses palpable. States, "feels similar to my previous blood clot".  Alert, NAD, calm, interactive, resps e/u, speaking in clear complete sentences, no dyspnea noted, skin W&D, VSS, walking with a limp.

## 2017-08-09 NOTE — ED Notes (Signed)
EDP into room, prior to RN assessment, see MD notes, pending orders.   

## 2017-08-10 ENCOUNTER — Telehealth (INDEPENDENT_AMBULATORY_CARE_PROVIDER_SITE_OTHER): Payer: Self-pay | Admitting: Orthopaedic Surgery

## 2017-08-10 NOTE — Telephone Encounter (Signed)
See message.

## 2017-08-10 NOTE — Telephone Encounter (Signed)
Sure if that's what she wants

## 2017-08-10 NOTE — Telephone Encounter (Signed)
Patient wants to know if she can have limited driving privileges.

## 2017-08-10 NOTE — Telephone Encounter (Signed)
xu patient 

## 2017-08-10 NOTE — Telephone Encounter (Signed)
Xu patient

## 2017-08-10 NOTE — Telephone Encounter (Signed)
Please advise. Thanks.  

## 2017-08-11 NOTE — Telephone Encounter (Signed)
Called patient to advise  °

## 2017-08-13 ENCOUNTER — Encounter: Payer: Self-pay | Admitting: Physical Therapy

## 2017-08-13 ENCOUNTER — Ambulatory Visit: Payer: Managed Care, Other (non HMO) | Attending: Orthopaedic Surgery | Admitting: Physical Therapy

## 2017-08-13 DIAGNOSIS — R262 Difficulty in walking, not elsewhere classified: Secondary | ICD-10-CM

## 2017-08-13 DIAGNOSIS — M25661 Stiffness of right knee, not elsewhere classified: Secondary | ICD-10-CM

## 2017-08-13 DIAGNOSIS — M6281 Muscle weakness (generalized): Secondary | ICD-10-CM | POA: Diagnosis present

## 2017-08-13 DIAGNOSIS — M25561 Pain in right knee: Secondary | ICD-10-CM | POA: Diagnosis not present

## 2017-08-13 NOTE — Therapy (Addendum)
[facility] [address]  [place], [place], [address] Phone: [phone]   Fax:  [phone]  Physical Therapy Treatment  Patient Details  Name: Kristin Anthony MRN: [id number] Date of Birth: Jun 05, 1950 Referring Provider: Frankey Shown, MD   Encounter Date: 08/13/2017  PT End of Session - 08/13/17 1136    Visit Number  2    Number of Visits  7    Date for PT Re-Evaluation  09/24/17    Authorization Type  [employer]    PT Start Time  1058    PT Stop Time  1133    PT Time Calculation (min)  35 min    Activity Tolerance  Patient tolerated treatment well;Patient limited by fatigue    Behavior During Therapy  [facility] for tasks assessed/performed       Past Medical History:  Diagnosis Date  . Arthritis   . Depression   . Diabetes mellitus   . DVT (deep venous thrombosis) ([facility])   . High cholesterol   . History of hiatal hernia   . Hypertension     Past Surgical History:  Procedure Laterality Date  . ABDOMINAL HYSTERECTOMY    . APPENDECTOMY    . BACK SURGERY    . KNEE SURGERY    . TOTAL KNEE ARTHROPLASTY Right 07/13/2017  . TOTAL KNEE ARTHROPLASTY Right 07/13/2017   Procedure: RIGHT TOTAL KNEE ARTHROPLASTY;  Surgeon: Leandrew Koyanagi, MD;  Location: [facility];  Service: Orthopedics;  Laterality: Right;    There were no vitals filed for this visit.  Subjective Assessment - 08/13/17 1101    Subjective  Reports went to see MD about L leg, was concerned about blood clot. Had testing and was negative. Things sciatica may have something to do with it and has been getting better.     Pertinent History  arthritis, depression, DM, hx DVT, HLD, hiatal hernia, HTN, back surgery    Diagnostic tests  06/23/17 R knee xray: Significant joint space narrowing consistent with degenerative joint disease. 07/13/17 R knee xray: RIGHT total knee arthroplasty without complicating features identified.    Patient Stated Goals  get better at bending  this knee especially when walking    Currently in Pain?  Yes    Pain Score  4  3.5    Pain Location  Leg R knee, L buttock    Pain Orientation  Right;Left    Pain Descriptors / Indicators  Aching;Dull    Pain Type  Acute pain;Surgical pain                       [facility] Adult PT Treatment/Exercise - 08/13/17 0001      Exercises   Exercises  Knee/Hip      Knee/Hip Exercises: Stretches   Passive Hamstring Stretch  Right;1 rep;30 seconds;Limitations    Passive Hamstring Stretch Limitations  supine strap    Hip Flexor Stretch  Right;2 reps;30 seconds;Limitations    Hip Flexor Stretch Limitations  mod thomas with strap    ITB Stretch  Right;2 reps;30 seconds;Limitations    ITB Stretch Limitations  supine strap      Knee/Hip Exercises: Aerobic   Stationary Bike  L1x6 min complete revolutions      Knee/Hip Exercises: Seated   Long Arc Quad  Strengthening;Right;1 set;10 reps;Weights;Limitations    Long Arc Quad Weight  2 lbs.    Hamstring Curl  Strengthening;Right;1 set;Limitations;15 reps    Hamstring Limitations  red TB; able to tolerate 11 reps before c/o fatigue    Sit to Sand  without UE support;1 set;10 reps cues for anterior trunk lean, ER, and slow eccentirc lower      Knee/Hip Exercises: Supine   Quad Sets  Strengthening;Right;1 set;10 reps;Limitations    Quad Sets Limitations  10x5" with towel under knee    Heel Slides  AAROM;Right;1 set;10 reps;Limitations    Heel Slides Limitations  10x3" with strap and orange pball    Straight Leg Raises  Strengthening;Right;1 set;10 reps;Limitations    Straight Leg Raises Limitations  cues for slow eccentric lower      Manual Therapy   Manual Therapy  Joint mobilization;Soft tissue mobilization    Joint Mobilization  R patellar mobs grade III to tolerance M/L and sup/inf    Soft tissue mobilization  scar massage to R of incision site, careful not to get cream in incision line               PT Short Term Goals  - 08/13/17 1142      PT SHORT TERM GOAL #1   Title  Patient to be independent with initial HEP.    Time  3    Period  Weeks    Status  Achieved        PT Long Term Goals - 08/06/17 0846      PT LONG TERM GOAL #1   Title  Patient to be independent with advanced HEP.    Time  6    Period  Weeks    Status  New    Target Date  09/17/17      PT LONG TERM GOAL #2   Title  Patient to demonstrate R knee AROM/PROM from 0-120 degrees.    Time  6    Period  Weeks    Status  New    Target Date  09/17/17      PT LONG TERM GOAL #3   Title  Patient to demonstrate B LE strength >=4+/5.    Time  6    Period  Weeks    Status  New    Target Date  09/17/17      PT LONG TERM GOAL #4   Title  Patient to demonstrate reciprocal stair climbing up 13 steps with 1 handrail with good eccentric control and without pain limiting.     Time  6    Period  Weeks    Status  New    Target Date  09/17/17      PT LONG TERM GOAL #5   Title  Patient to demonstrate gait pattern with equal step length, weight shift, and knee flexion/extension without antalgia.    Time  6    Period  Weeks    Status  New    Target Date  09/17/17            Plan - 08/13/17 1136    Clinical Impression Statement  Patient arrived to session ith report that she saw MD since last session for concern of DVT in L LE; reports testing was negative. Patient reporting low pain levels in R LE and no trouble with HEP. Able to use bike this session, completing full revolutions, however asking how long she will have to perform this activity. Tolerated scar massage to R of incision site, careful not to get cream in incision line. Also tolerated R patellar mobs with patient reporting benefit from this manual technique. Requesting to have  patellar mobs done at very beginning of session next time. Reviewed HEP exercises this date, patient with good form throughout. Very minor quad lag with SLR. Patient reporting fatigue after exercises,  although in no apparent distress. Denies pain, only reporting "I'm tired out." Requested to end session early.     PT Treatment/Interventions  ADLs/Self Care Home Management;Cryotherapy;Electrical Stimulation;Moist Heat;Ultrasound;DME Instruction;Gait training;Stair training;Functional mobility training;Therapeutic activities;Therapeutic exercise;Manual techniques;Patient/family education;Neuromuscular re-education;Balance training;Scar mobilization;Passive range of motion;Dry needling;Energy conservation;Splinting;Taping;Vasopneumatic Device    PT Next Visit Plan  continue scar massage and patellar mobs    Consulted and Agree with Plan of Care  Patient       Patient will benefit from skilled therapeutic intervention in order to improve the following deficits and impairments:  Decreased endurance, Decreased activity tolerance, Decreased strength, Pain, Decreased balance, Decreased mobility, Difficulty walking, Decreased range of motion, Impaired flexibility, Postural dysfunction, Improper body mechanics  Visit Diagnosis: Acute pain of right knee  Stiffness of right knee, not elsewhere classified  Muscle weakness (generalized)  Difficulty in walking, not elsewhere classified     Problem List Patient Active Problem List   Diagnosis Date Noted  . 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 Unilateral primary osteoarthritis, left knee 10/07/2016  . Chronic pain of both knees 10/07/2016  . Unilateral primary osteoarthritis, right knee 03/18/2016  . Closed fracture of tuft of distal phalanx of right thumb 08/31/2012    Janene Harvey, PT, DPT 08/13/17 11:43 AM   [facility] [address]  [place], [place], [address] Phone: [phone]   Fax:  [phone]  Name: Kristin Anthony MRN: [id number] Date of Birth: [birth year]  PHYSICAL THERAPY DISCHARGE SUMMARY  Visits from Start of Care: 2  Current functional level  related to goals / functional outcomes: Unable to assess as patient requested to be D/C'd    Remaining deficits: Unable to assess   Education / Equipment: HEP  Plan: Patient agrees to discharge.  Patient goals were not met. Patient is being discharged due to the patient's request.  ?????     Janene Harvey, PT, DPT 09/01/17 9:15 AM

## 2017-08-17 ENCOUNTER — Telehealth (INDEPENDENT_AMBULATORY_CARE_PROVIDER_SITE_OTHER): Payer: Self-pay | Admitting: Orthopaedic Surgery

## 2017-08-17 NOTE — Telephone Encounter (Signed)
Ok to refill 50-100mg  q6-8 hours prn pain

## 2017-08-17 NOTE — Telephone Encounter (Signed)
Patient called needing Rx refilled (Tramadol) The number to contact patient is [phone]

## 2017-08-17 NOTE — Telephone Encounter (Signed)
Please advise,

## 2017-08-18 ENCOUNTER — Other Ambulatory Visit (INDEPENDENT_AMBULATORY_CARE_PROVIDER_SITE_OTHER): Payer: Self-pay

## 2017-08-18 MED ORDER — TRAMADOL HCL 50 MG PO TABS: ORAL_TABLET | ORAL | 0 refills | Status: DC

## 2017-08-18 NOTE — Telephone Encounter (Signed)
Patient aware.

## 2017-08-18 NOTE — Telephone Encounter (Signed)
Called into pharm  

## 2017-08-20 ENCOUNTER — Ambulatory Visit: Payer: Managed Care, Other (non HMO) | Admitting: Physical Therapy

## 2017-08-23 ENCOUNTER — Other Ambulatory Visit (INDEPENDENT_AMBULATORY_CARE_PROVIDER_SITE_OTHER): Payer: Self-pay | Admitting: Physician Assistant

## 2017-08-25 ENCOUNTER — Ambulatory Visit (INDEPENDENT_AMBULATORY_CARE_PROVIDER_SITE_OTHER): Payer: Managed Care, Other (non HMO)

## 2017-08-25 ENCOUNTER — Ambulatory Visit (INDEPENDENT_AMBULATORY_CARE_PROVIDER_SITE_OTHER): Payer: Managed Care, Other (non HMO) | Admitting: Orthopaedic Surgery

## 2017-08-25 ENCOUNTER — Ambulatory Visit (INDEPENDENT_AMBULATORY_CARE_PROVIDER_SITE_OTHER): Payer: Self-pay

## 2017-08-25 ENCOUNTER — Encounter (INDEPENDENT_AMBULATORY_CARE_PROVIDER_SITE_OTHER): Payer: Self-pay | Admitting: Orthopaedic Surgery

## 2017-08-25 DIAGNOSIS — M1711 Unilateral primary osteoarthritis, right knee: Secondary | ICD-10-CM

## 2017-08-25 DIAGNOSIS — M1712 Unilateral primary osteoarthritis, left knee: Secondary | ICD-10-CM

## 2017-08-25 MED ORDER — BUPIVACAINE HCL 0.5 % IJ SOLN
2.0000 mL | INTRAMUSCULAR | Status: AC | PRN
Start: 2017-08-25 — End: 2017-08-25
  Administered 2017-08-25: 2 mL via INTRA_ARTICULAR

## 2017-08-25 MED ORDER — METHYLPREDNISOLONE ACETATE 40 MG/ML IJ SUSP
40.0000 mg | INTRAMUSCULAR | Status: AC | PRN
  Administered 2017-08-25: 40 mg via INTRA_ARTICULAR

## 2017-08-25 MED ORDER — LIDOCAINE HCL 1 % IJ SOLN
2.0000 mL | INTRAMUSCULAR | Status: AC | PRN
  Administered 2017-08-25: 2 mL

## 2017-08-25 NOTE — Progress Notes (Addendum)
Office Visit Note   Patient: Kristin Anthony           Date of Birth: 1950-07-31           MRN: [id number] Visit Date: 08/25/2017              Requested by: Creola Cornusso, John, MD [address] [place], [place] [address] PCP: Creola Cornusso, John, MD   Assessment & Plan: Visit Diagnoses:  1. Unilateral primary osteoarthritis, right knee   2. Unilateral primary osteoarthritis, left knee     Plan: Patient is 6 weeks status post right total knee replacement doing well.  Continue with physical therapy.  Left knee was injected today with cortisone.  Patient will monitor her sugars over the next couple weeks and will adjust her insulin dose accordingly.  Follow-up in 6 weeks for recheck of her knee replacement.  Follow-Up Instructions: Return in about 6 weeks (around 10/06/2017).   Orders:  Orders Placed This Encounter  Procedures  . Large Joint Inj: L knee  . XR KNEE 3 VIEW RIGHT  . XR KNEE 3 VIEW LEFT   Meds ordered this encounter  Medications  . bupivacaine (MARCAINE) 0.5 % (with pres) injection 2 mL  . lidocaine (XYLOCAINE) 1 % (with pres) injection 2 mL  . methylPREDNISolone acetate (DEPO-MEDROL) injection 40 mg      Procedures: Large Joint Inj: L knee on 08/25/2017 9:11 AM Details: 22 G needle Medications: 2 mL bupivacaine 0.5 %; 2 mL lidocaine 1 %; 40 mg methylPREDNISolone acetate 40 MG/ML Outcome: tolerated well, no immediate complications Patient was prepped and draped in the usual sterile fashion.       Clinical Data: No additional findings.   Subjective: Chief Complaint  Patient presents with  . Right Knee - Routine Post Op, Follow-up  . Left Knee - Pain    Kristin Anthony is 6 weeks status post right total knee replacement.  She is also coming in for follow-up of her left knee degenerative disease.  She is doing well from her right knee replacement.  She is able to achieve 100 degrees of flexion.  She continues to improve with physical therapy.  Her left knee is bothering her  worse at night and at rest.   Review of Systems   Objective: Vital Signs: There were no vitals taken for this visit.  Physical Exam  Ortho Exam Right knee exam shows a healed surgical scar.  Range of motion is 5-100 degrees. Left knee exam shows no joint effusion. Specialty Comments:  No specialty comments available.  Imaging: Xr Knee 3 View Left  Result Date: 08/25/2017 No acute or structural abnormalities.  Osteoarthritis.  Xr Knee 3 View Right  Result Date: 08/25/2017 Stable right total knee replacement.    PMFS History: Patient Active Problem List   Diagnosis Date Noted  . 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 Unilateral primary osteoarthritis, left knee 10/07/2016  . Chronic pain of both knees 10/07/2016  . Unilateral primary osteoarthritis, right knee 03/18/2016  . Closed fracture of tuft of distal phalanx of right thumb 08/31/2012   Past Medical History:  Diagnosis Date  . Arthritis   . Depression   . Diabetes mellitus   . DVT (deep venous thrombosis) ([facility])   . High cholesterol   . History of hiatal hernia   . Hypertension     Family History  Problem Relation Age of Onset  . Heart attack Mother   . Diabetes Father   . Hypertension Father   .  Hypertension Sister   . Diabetes Sister   . Hypertension Brother   . Diabetes Brother   . Hyperlipidemia Neg Hx   . Sudden death Neg Hx     Past Surgical History:  Procedure Laterality Date  . ABDOMINAL HYSTERECTOMY    . APPENDECTOMY    . BACK SURGERY    . KNEE SURGERY    . TOTAL KNEE ARTHROPLASTY Right 07/13/2017  . TOTAL KNEE ARTHROPLASTY Right 07/13/2017   Procedure: RIGHT TOTAL KNEE ARTHROPLASTY;  Surgeon: Tarry KosXu, Naiping M, MD;  Location: [facility];  Service: Orthopedics;  Laterality: Right;   Social History   Occupational History  . Not on file  Tobacco Use  . Smoking status: Never Smoker  . Smokeless tobacco: Never Used  Substance and Sexual Activity  . Alcohol use: No  . Drug use: No  .  Sexual activity: Not on file

## 2017-08-27 ENCOUNTER — Ambulatory Visit: Payer: Managed Care, Other (non HMO) | Admitting: Physical Therapy

## 2017-08-31 ENCOUNTER — Telehealth (INDEPENDENT_AMBULATORY_CARE_PROVIDER_SITE_OTHER): Payer: Self-pay

## 2017-08-31 NOTE — Telephone Encounter (Signed)
normal

## 2017-08-31 NOTE — Telephone Encounter (Signed)
Patient called this morning states her right knee is warm. No drainage no redness. Just warm. Would like to know if she needs to come in or if this is normal?    CB [phone]

## 2017-08-31 NOTE — Telephone Encounter (Signed)
Called her to let her know this is normal.

## 2017-09-03 ENCOUNTER — Ambulatory Visit: Payer: Managed Care, Other (non HMO) | Admitting: Physical Therapy

## 2017-09-09 ENCOUNTER — Ambulatory Visit: Payer: Managed Care, Other (non HMO) | Admitting: Physical Therapy

## 2017-09-17 ENCOUNTER — Ambulatory Visit: Payer: Managed Care, Other (non HMO) | Admitting: Physical Therapy

## 2017-10-06 ENCOUNTER — Encounter (INDEPENDENT_AMBULATORY_CARE_PROVIDER_SITE_OTHER): Payer: Self-pay | Admitting: Orthopaedic Surgery

## 2017-10-06 ENCOUNTER — Ambulatory Visit (INDEPENDENT_AMBULATORY_CARE_PROVIDER_SITE_OTHER): Payer: Managed Care, Other (non HMO) | Admitting: Orthopaedic Surgery

## 2017-10-06 DIAGNOSIS — M1711 Unilateral primary osteoarthritis, right knee: Secondary | ICD-10-CM

## 2017-10-06 NOTE — Progress Notes (Signed)
   Post-Op Visit Note   Patient: Kristin SchullerDianne M Fortunato           Date of Birth: 11/23/1950           MRN: [id number] Visit Date: 10/06/2017 PCP: Creola Cornusso, John, MD   Assessment & Plan:  Chief Complaint:  Chief Complaint  Patient presents with  . Left Knee - Follow-up   Visit Diagnoses:  1. Unilateral primary osteoarthritis, right knee     Plan: [name] is approximately 3 months status post right total knee replacement.  She is doing well overall.  She is very happy with her progress.  She has been discharged from physical therapy and just doing home exercises.  Her range of motion is 0 to 100 degrees.  Collaterals are stable.  She is ambulating without any assistive devices.  She is very happy overall.  I will see her back in 3 months with 2 view x-rays of the right knee.  Follow-Up Instructions: Return in about 3 months (around 01/05/2018).   Orders:  No orders of the defined types were placed in this encounter.  No orders of the defined types were placed in this encounter.   Imaging: No results found.  PMFS History: Patient Active Problem List   Diagnosis Date Noted  . 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 Unilateral primary osteoarthritis, left knee 10/07/2016  . Chronic pain of both knees 10/07/2016  . Unilateral primary osteoarthritis, right knee 03/18/2016  . Closed fracture of tuft of distal phalanx of right thumb 08/31/2012   Past Medical History:  Diagnosis Date  . Arthritis   . Depression   . Diabetes mellitus   . DVT (deep venous thrombosis) ([facility])   . High cholesterol   . History of hiatal hernia   . Hypertension     Family History  Problem Relation Age of Onset  . Heart attack Mother   . Diabetes Father   . Hypertension Father   . Hypertension Sister   . Diabetes Sister   . Hypertension Brother   . Diabetes Brother   . Hyperlipidemia Neg Hx   . Sudden death Neg Hx     Past Surgical History:  Procedure Laterality Date  . ABDOMINAL HYSTERECTOMY     . APPENDECTOMY    . BACK SURGERY    . KNEE SURGERY    . TOTAL KNEE ARTHROPLASTY Right 07/13/2017  . TOTAL KNEE ARTHROPLASTY Right 07/13/2017   Procedure: RIGHT TOTAL KNEE ARTHROPLASTY;  Surgeon: Tarry KosXu, Naiping M, MD;  Location: [facility];  Service: Orthopedics;  Laterality: Right;   Social History   Occupational History  . Not on file  Tobacco Use  . Smoking status: Never Smoker  . Smokeless tobacco: Never Used  Substance and Sexual Activity  . Alcohol use: No  . Drug use: No  . Sexual activity: Not on file

## 2018-01-07 ENCOUNTER — Ambulatory Visit (INDEPENDENT_AMBULATORY_CARE_PROVIDER_SITE_OTHER): Payer: Managed Care, Other (non HMO) | Admitting: Orthopaedic Surgery

## 2018-01-28 ENCOUNTER — Ambulatory Visit (INDEPENDENT_AMBULATORY_CARE_PROVIDER_SITE_OTHER): Payer: Managed Care, Other (non HMO)

## 2018-01-28 ENCOUNTER — Ambulatory Visit (INDEPENDENT_AMBULATORY_CARE_PROVIDER_SITE_OTHER): Payer: Managed Care, Other (non HMO) | Admitting: Orthopaedic Surgery

## 2018-01-28 ENCOUNTER — Encounter (INDEPENDENT_AMBULATORY_CARE_PROVIDER_SITE_OTHER): Payer: Self-pay | Admitting: Orthopaedic Surgery

## 2018-01-28 DIAGNOSIS — Z96651 Presence of right artificial knee joint: Secondary | ICD-10-CM

## 2018-01-28 NOTE — Progress Notes (Signed)
   Post-Op Visit Note   Patient: Kristin Anthony           Date of Birth: [birth year]           MRN: [id number] Visit Date: 01/28/2018 PCP: Creola Corn, MD   Assessment & Plan:  Chief Complaint:  Chief Complaint  Patient presents with  . Right Knee - Pain   Visit Diagnoses:  1. S/P TKR (total knee replacement), right     Plan: Patient is a pleasant 68 year old female presents our clinic today 6 months status post right total knee replacement.  She has been doing excellent.  She has finished formal physical therapy but continues to work on home exercise program.  Very minimal pain.  She does note a catching sensation to the lateral aspect at times which is not painful.  Examination of her right knee reveals well-healed surgical incision.  Range of motion 0 to 110 degrees.  She is stable valgus varus stress.  I do reproduce the catching sensation when stressing her and varus.  I have reassured her that this is just the metal hitting the plastic.  Dental prophylaxis reinforced today.  Continue home exercise program and follow-up with [place] in 6 months time for repeat evaluation and 3 view x-rays of the right knee.  Follow-Up Instructions: Return in about 6 months (around 07/29/2018).   Orders:  Orders Placed This Encounter  Procedures  . XR Knee 1-2 Views Right   No orders of the defined types were placed in this encounter.   Imaging: Xr Knee 1-2 Views Right  Result Date: 01/28/2018 X-rays demonstrate a well-seated prosthesis without evidence of subsidence or osteolysis   PMFS History: Patient Active Problem List   Diagnosis Date Noted  . 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 Unilateral primary osteoarthritis, left knee 10/07/2016  . Chronic pain of both knees 10/07/2016  . Unilateral primary osteoarthritis, right knee 03/18/2016  . Closed fracture of tuft of distal phalanx of right thumb 08/31/2012   Past Medical History:  Diagnosis Date  . Arthritis   . Depression     . Diabetes mellitus   . DVT (deep venous thrombosis) ([facility])   . High cholesterol   . History of hiatal hernia   . Hypertension     Family History  Problem Relation Age of Onset  . Heart attack Mother   . Diabetes Father   . Hypertension Father   . Hypertension Sister   . Diabetes Sister   . Hypertension Brother   . Diabetes Brother   . Hyperlipidemia Neg Hx   . Sudden death Neg Hx     Past Surgical History:  Procedure Laterality Date  . ABDOMINAL HYSTERECTOMY    . APPENDECTOMY    . BACK SURGERY    . KNEE SURGERY    . TOTAL KNEE ARTHROPLASTY Right 07/13/2017  . TOTAL KNEE ARTHROPLASTY Right 07/13/2017   Procedure: RIGHT TOTAL KNEE ARTHROPLASTY;  Surgeon: Tarry Kos, MD;  Location: [facility];  Service: Orthopedics;  Laterality: Right;   Social History   Occupational History  . Not on file  Tobacco Use  . Smoking status: Never Smoker  . Smokeless tobacco: Never Used  Substance and Sexual Activity  . Alcohol use: No  . Drug use: No  . Sexual activity: Not on file

## 2018-05-13 ENCOUNTER — Other Ambulatory Visit (HOSPITAL_BASED_OUTPATIENT_CLINIC_OR_DEPARTMENT_OTHER): Payer: Self-pay | Admitting: Emergency Medicine

## 2018-05-13 DIAGNOSIS — I2699 Other pulmonary embolism without acute cor pulmonale: Secondary | ICD-10-CM

## 2018-05-14 ENCOUNTER — Other Ambulatory Visit: Payer: Self-pay

## 2018-05-14 ENCOUNTER — Ambulatory Visit (HOSPITAL_BASED_OUTPATIENT_CLINIC_OR_DEPARTMENT_OTHER)
Admission: RE | Admit: 2018-05-14 | Discharge: 2018-05-14 | Disposition: A | Discharge status: Home / Self Care | Payer: Managed Care, Other (non HMO) | Source: Ambulatory Visit | Attending: Emergency Medicine | Admitting: Emergency Medicine

## 2018-05-14 ENCOUNTER — Encounter (HOSPITAL_BASED_OUTPATIENT_CLINIC_OR_DEPARTMENT_OTHER): Payer: Self-pay

## 2018-05-14 DIAGNOSIS — I2699 Other pulmonary embolism without acute cor pulmonale: Secondary | ICD-10-CM

## 2018-05-14 MED ORDER — IOHEXOL 350 MG/ML SOLN
100.0000 mL | Freq: Once | INTRAVENOUS | Status: AC | PRN
  Administered 2018-05-14: 74 mL via INTRAVENOUS

## 2018-07-29 ENCOUNTER — Ambulatory Visit: Payer: Self-pay | Admitting: Orthopaedic Surgery

## 2019-11-28 DIAGNOSIS — Z1231 Encounter for screening mammogram for malignant neoplasm of breast: Secondary | ICD-10-CM | POA: Diagnosis not present

## 2019-11-30 DIAGNOSIS — E119 Type 2 diabetes mellitus without complications: Secondary | ICD-10-CM | POA: Diagnosis not present

## 2019-11-30 DIAGNOSIS — Z961 Presence of intraocular lens: Secondary | ICD-10-CM | POA: Diagnosis not present

## 2020-02-02 ENCOUNTER — Other Ambulatory Visit: Payer: Self-pay

## 2020-02-02 ENCOUNTER — Emergency Department (HOSPITAL_BASED_OUTPATIENT_CLINIC_OR_DEPARTMENT_OTHER): Payer: BC Managed Care – PPO

## 2020-02-02 ENCOUNTER — Emergency Department (HOSPITAL_BASED_OUTPATIENT_CLINIC_OR_DEPARTMENT_OTHER)
Admission: EM | Admit: 2020-02-02 | Discharge: 2020-02-02 | Disposition: A | Discharge status: Home / Self Care | Payer: BC Managed Care – PPO | Attending: Emergency Medicine | Admitting: Emergency Medicine

## 2020-02-02 ENCOUNTER — Encounter (HOSPITAL_BASED_OUTPATIENT_CLINIC_OR_DEPARTMENT_OTHER): Payer: Self-pay

## 2020-02-02 DIAGNOSIS — M79604 Pain in right leg: Secondary | ICD-10-CM | POA: Diagnosis not present

## 2020-02-02 DIAGNOSIS — Z794 Long term (current) use of insulin: Secondary | ICD-10-CM | POA: Diagnosis not present

## 2020-02-02 DIAGNOSIS — Z79899 Other long term (current) drug therapy: Secondary | ICD-10-CM | POA: Diagnosis not present

## 2020-02-02 DIAGNOSIS — E119 Type 2 diabetes mellitus without complications: Secondary | ICD-10-CM | POA: Insufficient documentation

## 2020-02-02 DIAGNOSIS — I10 Essential (primary) hypertension: Secondary | ICD-10-CM | POA: Insufficient documentation

## 2020-02-02 DIAGNOSIS — Z7982 Long term (current) use of aspirin: Secondary | ICD-10-CM | POA: Insufficient documentation

## 2020-02-02 DIAGNOSIS — Z96651 Presence of right artificial knee joint: Secondary | ICD-10-CM | POA: Diagnosis not present

## 2020-02-02 DIAGNOSIS — M79651 Pain in right thigh: Secondary | ICD-10-CM | POA: Diagnosis not present

## 2020-02-02 DIAGNOSIS — Z7984 Long term (current) use of oral hypoglycemic drugs: Secondary | ICD-10-CM | POA: Diagnosis not present

## 2020-02-02 MED ORDER — KETOROLAC TROMETHAMINE 30 MG/ML IJ SOLN
30.0000 mg | Freq: Once | INTRAMUSCULAR | Status: AC
Start: 2020-02-02 — End: 2020-02-02
  Administered 2020-02-02: 30 mg via INTRAMUSCULAR
  Filled 2020-02-02: qty 1

## 2020-02-02 NOTE — ED Provider Notes (Signed)
[facility] EMERGENCY DEPARTMENT Provider Note   CSN: [id number] Arrival date & time: 02/02/20  6010     History Chief Complaint  Patient presents with  . Leg Pain    Kristin Anthony is a 70 y.o. female.  Patient is 70 year old female with reported hx of blood clot in her left lower extremity and now presenting with pain in her right LE that she reports is similar to pain she experienced before. She reports having pain in her right thigh for 1 week.  She reports that she has been in the house resting since the snow started.  Patient reports that she woke up 1 day with a twisting like pain in her right thigh.  She denies any recent prolonged travel or prolonged sitting.  She states that she is able to ambulate normally but has some discomfort.  Pain appears to be deep within her thigh.  She denies any swelling or redness.  She states that this is a similar presentation to when she had her DVT of her left lower extremity.  Patient denies being on any hormone replacement therapy.  She had a hysterectomy in 1976 after having a child in 1975.  Patient reports that she still has her ovaries and has been going through perimenopause without any medication or natural supplementation to trigger symptoms.  She denies any treatment for active cancer.  She denies any recent trauma to the leg.  Patient does report having knee replacement on her right lower extremity about 2 years ago.          Past Medical History:  Diagnosis Date  . Arthritis   . Depression   . Diabetes mellitus   . DVT (deep venous thrombosis) (HCC)   . High cholesterol   . History of hiatal hernia   . Hypertension     Patient Active Problem List   Diagnosis Date Noted  . 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 Unilateral primary osteoarthritis, left knee 10/07/2016  . Chronic pain of both knees 10/07/2016  . Unilateral primary osteoarthritis, right knee 03/18/2016  . Closed fracture of tuft of distal  phalanx of right thumb 08/31/2012    Past Surgical History:  Procedure Laterality Date  . ABDOMINAL HYSTERECTOMY    . APPENDECTOMY    . BACK SURGERY    . KNEE SURGERY    . TOTAL KNEE ARTHROPLASTY Right 07/13/2017  . TOTAL KNEE ARTHROPLASTY Right 07/13/2017   Procedure: RIGHT TOTAL KNEE ARTHROPLASTY;  Surgeon: Tarry Kos, MD;  Location: [facility] OR;  Service: Orthopedics;  Laterality: Right;     OB History   No obstetric history on file.     Family History  Problem Relation Age of Onset  . Heart attack Mother   . Diabetes Father   . Hypertension Father   . Hypertension Sister   . Diabetes Sister   . Hypertension Brother   . Diabetes Brother   . Hyperlipidemia Neg Hx   . Sudden death Neg Hx     Social History   Tobacco Use  . Smoking status: Never Smoker  . Smokeless tobacco: Never Used  Vaping Use  . Vaping Use: Never used  Substance Use Topics  . Alcohol use: No  . Drug use: No    Home Medications Prior to Admission medications   Medication Sig Start Date End Date Taking? Authorizing Provider  amLODipine (NORVASC) 10 MG tablet Take 10 mg by mouth at bedtime.    [provider]  aspirin EC 81 MG tablet Take 1 tablet (81 mg total) by mouth 2 (two) times daily. 07/15/17   Cristie Hem, PA-C  atorvastatin (LIPITOR) 40 MG tablet Take 40 mg by mouth at bedtime.    [provider]  diclofenac sodium (VOLTAREN) 1 % GEL Apply 2 g topically 4 (four) times daily. 08/04/17   Tarry Kos, MD  escitalopram (LEXAPRO) 20 MG tablet Take 20 mg by mouth daily.    [provider]  HYDROcodone-acetaminophen (NORCO) 7.5-325 MG tablet Take 1-2 tablets by mouth 2 (two) times daily as needed for moderate pain. Patient not taking: Reported on 10/06/2017 07/30/17   Tarry Kos, MD  insulin lispro (HUMALOG KWIKPEN) 100 UNIT/ML KiwkPen Inject 12 Units into the skin 2 (two) times daily.    [provider]  JANUVIA 100 MG tablet Take 100 mg by mouth.  03/13/16    [provider]  methocarbamol (ROBAXIN) 500 MG tablet Take 1 tablet (500 mg total) by mouth every 6 (six) hours as needed for muscle spasms. Patient not taking: Reported on 10/06/2017 07/30/17   Tarry Kos, MD  methocarbamol (ROBAXIN) 750 MG tablet Take 1 tablet (750 mg total) by mouth 2 (two) times daily as needed for muscle spasms. Patient not taking: Reported on 10/06/2017 07/15/17   Cristie Hem, PA-C  olmesartan (BENICAR) 40 MG tablet Take 40 mg by mouth daily.    [provider]  ondansetron (ZOFRAN) 4 MG tablet Take 1-2 tablets (4-8 mg total) by mouth every 8 (eight) hours as needed for nausea or vomiting. 07/15/17   Cristie Hem, PA-C  oxyCODONE (OXY IR/ROXICODONE) 5 MG immediate release tablet Take 1-3 tablets (5-15 mg total) by mouth every 4 (four) hours as needed. Patient not taking: Reported on 10/06/2017 07/15/17   Cristie Hem, PA-C  promethazine (PHENERGAN) 25 MG tablet Take 1 tablet (25 mg total) by mouth every 6 (six) hours as needed for nausea. Patient not taking: Reported on 10/06/2017 07/15/17   Cristie Hem, PA-C  senna-docusate (SENOKOT S) 8.6-50 MG tablet Take 1 tablet by mouth at bedtime as needed. 07/15/17   Cristie Hem, PA-C  sulfamethoxazole-trimethoprim (BACTRIM DS,SEPTRA DS) 800-160 MG tablet Take 1 tablet by mouth 2 (two) times daily. 07/15/17   Cristie Hem, PA-C  TOUJEO SOLOSTAR 300 UNIT/ML SOPN Inject 32 Units into the skin 2 (two) times daily.  11/21/15   [provider]  traMADol Janean Sark) 50 MG tablet Take 1-2 tabs po q6-8 hours prn pain Patient not taking: Reported on 10/06/2017 08/18/17   Cristie Hem, PA-C    Allergies    Codeine  Review of Systems   Review of Systems  Respiratory: Negative for chest tightness and shortness of breath.   Cardiovascular: Negative for chest pain and leg swelling.  Gastrointestinal: Negative for abdominal pain.  Musculoskeletal: Positive for myalgias.       Right thigh pain       Physical Exam Updated Vital Signs BP 140/79 (BP Location: Right Arm)   Pulse 88   Temp 98.8 F (37.1 C) (Oral)   Resp 18   Ht 5\' 6"  (1.676 m)   Wt 86.2 kg   SpO2 98%   BMI 30.67 kg/m   Physical Exam Cardiovascular:     Rate and Rhythm: Normal rate and regular rhythm.     Pulses: Normal pulses.     Heart sounds: Murmur heard.    Pulmonary:     Effort: Pulmonary  effort is normal. No respiratory distress.     Breath sounds: Normal breath sounds. No wheezing.  Musculoskeletal:        General: Tenderness present. No swelling. Normal range of motion.     Right lower leg: No edema.       Legs:     ED Results / Procedures / Treatments   Labs (all labs ordered are listed, but only abnormal results are displayed) Labs Reviewed - No data to display  EKG None  Radiology US Venous Img Lower Right (DVT Study)  Result Date: 02/02/2020 CLINICAL DATA:  Right thigh pain. EXAM: RIGHT LOWER EXTREMITY VENOUS DOPPLER ULTRASOUND TECHNIQUE: Gray-scale sonography with compression, as well as color and duplex ultrasound, were performed to evaluate the deep venous system(s) from the level of the common femoral vein through the popliteal and proximal calf veins. COMPARISON:  10/03/2015. FINDINGS: VENOUS Normal compressibility of the common femoral, superficial femoral, and popliteal veins, as well as the visualized calf veins. Visualized portions of profunda femoral vein and great saphenous vein unremarkable. No filling defects to suggest DVT on grayscale or color Doppler imaging. Doppler waveforms show normal direction of venous flow, normal respiratory plasticity and response to augmentation. Limited views of the contralateral common femoral vein are unremarkable. IMPRESSION: No evidence of DVT. Electronically Signed   By: Feliberto Harts MD   On: 02/02/2020 09:51    Procedures Procedures (including critical care time)  Medications Ordered in ED Medications  ketorolac (TORADOL) 30  MG/ML injection 30 mg (30 mg Intramuscular Given 02/02/20 1025)    ED Course  I have reviewed the triage vital signs and the nursing notes.  Pertinent labs & imaging results that were available during my care of the patient were reviewed by me and considered in my medical decision making (see chart for details).  Clinical Course as of 02/02/20 1047  Thu Feb 02, 2020  3009 Patient undergoing LE DVT U/S. Recommended for outpatient follow up if u/s study negative for DVT, discussed with patient  [MS]    Clinical Course User Index [MS] Simmons-Robinson, Tawanna Cooler, MD   MDM Rules/Calculators/A&P                           CALENE PARADISO is a 70 y.o. female presenting with right thigh pain that she reports is similar to presentation to when she had her left LE DVT. Patient does not appear to have superficial skin changes or nodules to palpation of affected extremity. Well's score of 2 for hx of DVT and tenderness in venous distribution. Venous ultrasound is negative for DVT today. Discussed with patient to return to ED if she experiences difficulty breathing, redness or swelling. Patient was counseled to schedule follow up with her PCP within a week in the event that symptoms continue and she may need another venous ultrasound scan. Patient's vitals were stable and within normal limits at the time of discharge. Patient is well appearing and had improved pain after dose of Toradol for LE pain.   Final Clinical Impression(s) / ED Diagnoses Final diagnoses:  Right leg pain    Rx / DC Orders ED Discharge Orders    None       Ronnald Ramp, MD 02/02/20 1047    Sabino Donovan, MD 02/03/20 [phone]

## 2020-02-02 NOTE — ED Triage Notes (Signed)
Pt reports R upper leg pain for a week. Pt endorses hx of blood clot in L leg and states this pain is the same.

## 2020-02-02 NOTE — ED Notes (Signed)
C/p pain and swelling to inside of rt upper leg x 1 week  Denies travel

## 2020-02-02 NOTE — Discharge Instructions (Addendum)
You were evaluated for a DVT or blood clot in your right leg.   The ultrasound did not show a blood clot today.   I recommend that you follow up with your primary care doctor if you continue to have  these symptoms given your history.   You will need to return to the ED if you experience difficulty breathing, worsening leg pain,welling or redness.

## 2020-04-16 DIAGNOSIS — E785 Hyperlipidemia, unspecified: Secondary | ICD-10-CM | POA: Diagnosis not present

## 2020-04-16 DIAGNOSIS — E1165 Type 2 diabetes mellitus with hyperglycemia: Secondary | ICD-10-CM | POA: Diagnosis not present

## 2020-04-16 DIAGNOSIS — E559 Vitamin D deficiency, unspecified: Secondary | ICD-10-CM | POA: Diagnosis not present

## 2020-04-23 DIAGNOSIS — I129 Hypertensive chronic kidney disease with stage 1 through stage 4 chronic kidney disease, or unspecified chronic kidney disease: Secondary | ICD-10-CM | POA: Diagnosis not present

## 2020-04-23 DIAGNOSIS — Z1331 Encounter for screening for depression: Secondary | ICD-10-CM | POA: Diagnosis not present

## 2020-04-23 DIAGNOSIS — Z0000 Encounter for general adult medical examination without abnormal findings: Secondary | ICD-10-CM | POA: Diagnosis not present

## 2020-04-23 DIAGNOSIS — Z1339 Encounter for screening examination for other mental health and behavioral disorders: Secondary | ICD-10-CM | POA: Diagnosis not present

## 2020-04-23 DIAGNOSIS — R82998 Other abnormal findings in urine: Secondary | ICD-10-CM | POA: Diagnosis not present

## 2020-04-23 DIAGNOSIS — E1122 Type 2 diabetes mellitus with diabetic chronic kidney disease: Secondary | ICD-10-CM | POA: Diagnosis not present

## 2020-05-16 DIAGNOSIS — R82998 Other abnormal findings in urine: Secondary | ICD-10-CM | POA: Diagnosis not present

## 2020-05-16 DIAGNOSIS — I10 Essential (primary) hypertension: Secondary | ICD-10-CM | POA: Diagnosis not present

## 2020-05-24 DIAGNOSIS — M8589 Other specified disorders of bone density and structure, multiple sites: Secondary | ICD-10-CM | POA: Diagnosis not present

## 2020-06-01 DIAGNOSIS — K122 Cellulitis and abscess of mouth: Secondary | ICD-10-CM | POA: Diagnosis not present

## 2020-08-23 DIAGNOSIS — U071 COVID-19: Secondary | ICD-10-CM | POA: Diagnosis not present

## 2020-10-05 ENCOUNTER — Encounter (HOSPITAL_BASED_OUTPATIENT_CLINIC_OR_DEPARTMENT_OTHER): Payer: Self-pay

## 2020-10-05 ENCOUNTER — Emergency Department (HOSPITAL_BASED_OUTPATIENT_CLINIC_OR_DEPARTMENT_OTHER)
Admission: EM | Admit: 2020-10-05 | Discharge: 2020-10-05 | Disposition: A | Discharge status: Home / Self Care | Payer: No Typology Code available for payment source | Attending: Emergency Medicine | Admitting: Emergency Medicine

## 2020-10-05 ENCOUNTER — Other Ambulatory Visit: Payer: Self-pay

## 2020-10-05 ENCOUNTER — Emergency Department (HOSPITAL_BASED_OUTPATIENT_CLINIC_OR_DEPARTMENT_OTHER): Payer: No Typology Code available for payment source

## 2020-10-05 DIAGNOSIS — Z7984 Long term (current) use of oral hypoglycemic drugs: Secondary | ICD-10-CM | POA: Insufficient documentation

## 2020-10-05 DIAGNOSIS — R519 Headache, unspecified: Secondary | ICD-10-CM | POA: Diagnosis not present

## 2020-10-05 DIAGNOSIS — M542 Cervicalgia: Secondary | ICD-10-CM | POA: Insufficient documentation

## 2020-10-05 DIAGNOSIS — Z794 Long term (current) use of insulin: Secondary | ICD-10-CM | POA: Insufficient documentation

## 2020-10-05 DIAGNOSIS — M62838 Other muscle spasm: Secondary | ICD-10-CM | POA: Diagnosis not present

## 2020-10-05 DIAGNOSIS — Y92410 Unspecified street and highway as the place of occurrence of the external cause: Secondary | ICD-10-CM | POA: Insufficient documentation

## 2020-10-05 DIAGNOSIS — Z96651 Presence of right artificial knee joint: Secondary | ICD-10-CM | POA: Insufficient documentation

## 2020-10-05 DIAGNOSIS — Z79899 Other long term (current) drug therapy: Secondary | ICD-10-CM | POA: Diagnosis not present

## 2020-10-05 DIAGNOSIS — Z7982 Long term (current) use of aspirin: Secondary | ICD-10-CM | POA: Insufficient documentation

## 2020-10-05 DIAGNOSIS — E119 Type 2 diabetes mellitus without complications: Secondary | ICD-10-CM | POA: Diagnosis not present

## 2020-10-05 DIAGNOSIS — I10 Essential (primary) hypertension: Secondary | ICD-10-CM | POA: Insufficient documentation

## 2020-10-05 DIAGNOSIS — Z041 Encounter for examination and observation following transport accident: Secondary | ICD-10-CM | POA: Diagnosis not present

## 2020-10-05 LAB — CBG MONITORING, ED: Glucose-Capillary: 192 mg/dL — ABNORMAL HIGH (ref 70–99)
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
## 2020-10-11 DIAGNOSIS — M542 Cervicalgia: Secondary | ICD-10-CM | POA: Diagnosis not present

## 2020-10-29 DIAGNOSIS — E1122 Type 2 diabetes mellitus with diabetic chronic kidney disease: Secondary | ICD-10-CM | POA: Diagnosis not present

## 2020-10-29 DIAGNOSIS — I129 Hypertensive chronic kidney disease with stage 1 through stage 4 chronic kidney disease, or unspecified chronic kidney disease: Secondary | ICD-10-CM | POA: Diagnosis not present

## 2020-12-05 DIAGNOSIS — R002 Palpitations: Secondary | ICD-10-CM | POA: Diagnosis not present

## 2020-12-05 DIAGNOSIS — I10 Essential (primary) hypertension: Secondary | ICD-10-CM | POA: Diagnosis not present

## 2020-12-08 DIAGNOSIS — I10 Essential (primary) hypertension: Secondary | ICD-10-CM | POA: Diagnosis not present

## 2020-12-08 DIAGNOSIS — I2109 ST elevation (STEMI) myocardial infarction involving other coronary artery of anterior wall: Secondary | ICD-10-CM | POA: Diagnosis not present

## 2020-12-12 DIAGNOSIS — I129 Hypertensive chronic kidney disease with stage 1 through stage 4 chronic kidney disease, or unspecified chronic kidney disease: Secondary | ICD-10-CM | POA: Diagnosis not present

## 2020-12-12 DIAGNOSIS — E1140 Type 2 diabetes mellitus with diabetic neuropathy, unspecified: Secondary | ICD-10-CM | POA: Diagnosis not present

## 2020-12-12 DIAGNOSIS — N1831 Chronic kidney disease, stage 3a: Secondary | ICD-10-CM | POA: Diagnosis not present

## 2020-12-13 DIAGNOSIS — R0602 Shortness of breath: Secondary | ICD-10-CM | POA: Diagnosis not present

## 2020-12-13 DIAGNOSIS — E669 Obesity, unspecified: Secondary | ICD-10-CM | POA: Diagnosis not present

## 2020-12-14 DIAGNOSIS — I129 Hypertensive chronic kidney disease with stage 1 through stage 4 chronic kidney disease, or unspecified chronic kidney disease: Secondary | ICD-10-CM | POA: Diagnosis not present

## 2020-12-26 DIAGNOSIS — R0602 Shortness of breath: Secondary | ICD-10-CM | POA: Diagnosis not present

## 2021-01-24 DIAGNOSIS — L304 Erythema intertrigo: Secondary | ICD-10-CM | POA: Diagnosis not present

## 2021-01-24 DIAGNOSIS — L738 Other specified follicular disorders: Secondary | ICD-10-CM | POA: Diagnosis not present

## 2021-01-24 DIAGNOSIS — D485 Neoplasm of uncertain behavior of skin: Secondary | ICD-10-CM | POA: Diagnosis not present

## 2021-02-21 DIAGNOSIS — Z1231 Encounter for screening mammogram for malignant neoplasm of breast: Secondary | ICD-10-CM | POA: Diagnosis not present

## 2021-04-15 DIAGNOSIS — E119 Type 2 diabetes mellitus without complications: Secondary | ICD-10-CM | POA: Diagnosis not present

## 2021-04-15 DIAGNOSIS — H43393 Other vitreous opacities, bilateral: Secondary | ICD-10-CM | POA: Diagnosis not present

## 2021-04-15 DIAGNOSIS — Z794 Long term (current) use of insulin: Secondary | ICD-10-CM | POA: Diagnosis not present

## 2021-04-15 DIAGNOSIS — H26493 Other secondary cataract, bilateral: Secondary | ICD-10-CM | POA: Diagnosis not present

## 2021-05-02 DIAGNOSIS — E1122 Type 2 diabetes mellitus with diabetic chronic kidney disease: Secondary | ICD-10-CM | POA: Diagnosis not present

## 2021-05-02 DIAGNOSIS — E559 Vitamin D deficiency, unspecified: Secondary | ICD-10-CM | POA: Diagnosis not present

## 2021-05-09 DIAGNOSIS — R82998 Other abnormal findings in urine: Secondary | ICD-10-CM | POA: Diagnosis not present

## 2021-05-09 DIAGNOSIS — M25562 Pain in left knee: Secondary | ICD-10-CM | POA: Diagnosis not present

## 2021-05-11 ENCOUNTER — Encounter (HOSPITAL_BASED_OUTPATIENT_CLINIC_OR_DEPARTMENT_OTHER): Payer: Self-pay | Admitting: Emergency Medicine

## 2021-05-11 ENCOUNTER — Emergency Department (HOSPITAL_BASED_OUTPATIENT_CLINIC_OR_DEPARTMENT_OTHER): Payer: BC Managed Care – PPO

## 2021-05-11 ENCOUNTER — Other Ambulatory Visit: Payer: Self-pay

## 2021-05-11 ENCOUNTER — Emergency Department (HOSPITAL_BASED_OUTPATIENT_CLINIC_OR_DEPARTMENT_OTHER)
Admission: EM | Admit: 2021-05-11 | Discharge: 2021-05-11 | Disposition: A | Discharge status: Home / Self Care | Payer: BC Managed Care – PPO | Attending: Emergency Medicine | Admitting: Emergency Medicine

## 2021-05-11 DIAGNOSIS — M25562 Pain in left knee: Secondary | ICD-10-CM | POA: Diagnosis not present

## 2021-05-11 DIAGNOSIS — Z7982 Long term (current) use of aspirin: Secondary | ICD-10-CM | POA: Diagnosis not present

## 2021-05-11 DIAGNOSIS — M79605 Pain in left leg: Secondary | ICD-10-CM | POA: Diagnosis not present

## 2021-05-11 DIAGNOSIS — R590 Localized enlarged lymph nodes: Secondary | ICD-10-CM | POA: Diagnosis not present

## 2021-05-11 MED ORDER — KETOROLAC TROMETHAMINE 30 MG/ML IJ SOLN
30.0000 mg | Freq: Once | INTRAMUSCULAR | Status: AC
  Administered 2021-05-11: 30 mg via INTRAMUSCULAR
  Filled 2021-05-11: qty 1

## 2021-05-11 MED ORDER — OXYCODONE-ACETAMINOPHEN 5-325 MG PO TABS
1.0000 | ORAL_TABLET | Freq: Once | ORAL | Status: AC
  Administered 2021-05-11: 1 via ORAL
  Filled 2021-05-11: qty 1

## 2021-05-11 NOTE — ED Notes (Signed)
Patient Alert and oriented to baseline. Stable and ambulatory to baseline. Patient verbalized understanding of the discharge instructions.  Patient belongings were taken by the patient.   

## 2021-05-11 NOTE — ED Provider Notes (Signed)
?[facility] EMERGENCY DEPARTMENT ?Provider Note ? ? ?CSN: [id number] ?Arrival date & time: 05/11/21  1022 ? ?  ? ?History ? ?Chief Complaint  ?Patient presents with  ? Knee Pain  ? ? ?Kristin Anthony is a 71 y.o. female.  Presents for right knee pain.  Patient states that for approximately 7 days she has been having pain in her left knee.  Does not recall any associated trauma.  Has not noted any significant swelling.  Has been able to bear weight.  Pain is worse with ambulation.  Currently mild to moderate.  Reports that she had a blood clot in this leg many years ago but is no longer on blood thinners. ? ?HPI ? ?  ? ?Home Medications ?Prior to Admission medications   ?Medication Sig Start Date End Date Taking? Authorizing Provider  ?amLODipine (NORVASC) 10 MG tablet Take 10 mg by mouth at bedtime.    [provider]  ?aspirin EC 81 MG tablet Take 1 tablet (81 mg total) by mouth 2 (two) times daily. 07/15/17   Cristie Hem, PA-C  ?atorvastatin (LIPITOR) 40 MG tablet Take 40 mg by mouth at bedtime.    [provider]  ?diclofenac sodium (VOLTAREN) 1 % GEL Apply 2 g topically 4 (four) times daily. 08/04/17   Tarry Kos, MD  ?escitalopram (LEXAPRO) 20 MG tablet Take 20 mg by mouth daily.    [provider]  ?HYDROcodone-acetaminophen (NORCO) 7.5-325 MG tablet Take 1-2 tablets by mouth 2 (two) times daily as needed for moderate pain. ?Patient not taking: Reported on 10/06/2017 07/30/17   Tarry Kos, MD  ?insulin lispro (HUMALOG KWIKPEN) 100 UNIT/ML KiwkPen Inject 12 Units into the skin 2 (two) times daily.    [provider]  ?JANUVIA 100 MG tablet Take 100 mg by mouth.  03/13/16   [provider]  ?methocarbamol (ROBAXIN) 500 MG tablet Take 1 tablet (500 mg total) by mouth every 6 (six) hours as needed for muscle spasms. ?Patient not taking: Reported on 10/06/2017 07/30/17   Tarry Kos, MD  ?methocarbamol (ROBAXIN) 750 MG tablet Take 1 tablet (750 mg total) by  mouth 2 (two) times daily as needed for muscle spasms. ?Patient not taking: Reported on 10/06/2017 07/15/17   Cristie Hem, PA-C  ?olmesartan (BENICAR) 40 MG tablet Take 40 mg by mouth daily.    [provider]  ?ondansetron (ZOFRAN) 4 MG tablet Take 1-2 tablets (4-8 mg total) by mouth every 8 (eight) hours as needed for nausea or vomiting. 07/15/17   Cristie Hem, PA-C  ?oxyCODONE (OXY IR/ROXICODONE) 5 MG immediate release tablet Take 1-3 tablets (5-15 mg total) by mouth every 4 (four) hours as needed. ?Patient not taking: Reported on 10/06/2017 07/15/17   Cristie Hem, PA-C  ?promethazine (PHENERGAN) 25 MG tablet Take 1 tablet (25 mg total) by mouth every 6 (six) hours as needed for nausea. ?Patient not taking: Reported on 10/06/2017 07/15/17   Cristie Hem, PA-C  ?senna-docusate (SENOKOT S) 8.6-50 MG tablet Take 1 tablet by mouth at bedtime as needed. 07/15/17   Cristie Hem, PA-C  ?sulfamethoxazole-trimethoprim (BACTRIM DS,SEPTRA DS) 800-160 MG tablet Take 1 tablet by mouth 2 (two) times daily. 07/15/17   Cristie Hem, PA-C  ?TOUJEO SOLOSTAR 300 UNIT/ML SOPN Inject 32 Units into the skin 2 (two) times daily.  11/21/15   [provider]  ?traMADol (ULTRAM) 50 MG tablet Take 1-2 tabs po q6-8 hours prn pain ?Patient not  taking: Reported on 10/06/2017 08/18/17   Cristie HemStanbery, Mary L, PA-C  ?   ? ?Allergies    ?Codeine   ? ?Review of Systems   ?Review of Systems  ?Constitutional:  Negative for chills and fever.  ?HENT:  Negative for ear pain and sore throat.   ?Eyes:  Negative for pain and visual disturbance.  ?Respiratory:  Negative for cough and shortness of breath.   ?Cardiovascular:  Negative for chest pain and palpitations.  ?Gastrointestinal:  Negative for abdominal pain and vomiting.  ?Genitourinary:  Negative for dysuria and hematuria.  ?Musculoskeletal:  Positive for arthralgias. Negative for back pain.  ?Skin:  Negative for color change and rash.  ?Neurological:  Negative for seizures and  syncope.  ?All other systems reviewed and are negative. ? ?Physical Exam ?Updated Vital Signs ?BP (!) 153/77   Pulse 82   Temp 97.8 ?F (36.6 ?C) (Oral)   Resp 16   Ht 5\' 6"  (1.676 m)   Wt 90.7 kg   SpO2 96%   BMI 32.28 kg/m?  ?Physical Exam ?Vitals and nursing note reviewed.  ?Constitutional:   ?   General: She is not in acute distress. ?   Appearance: She is well-developed.  ?HENT:  ?   Head: Normocephalic and atraumatic.  ?Eyes:  ?   Conjunctiva/sclera: Conjunctivae normal.  ?Cardiovascular:  ?   Rate and Rhythm: Normal rate and regular rhythm.  ?   Heart sounds: No murmur heard. ?Pulmonary:  ?   Effort: Pulmonary effort is normal. No respiratory distress.  ?   Breath sounds: Normal breath sounds.  ?Abdominal:  ?   Palpations: Abdomen is soft.  ?   Tenderness: There is no abdominal tenderness.  ?Musculoskeletal:  ?   Cervical back: Neck supple.  ?   Comments: Left lower extremity: Some mild generalized tenderness about the knee but there is no deformity or swelling noted, normal knee ROM, normal DP PT pulses  ?Skin: ?   General: Skin is warm and dry.  ?   Capillary Refill: Capillary refill takes less than 2 seconds.  ?Neurological:  ?   General: No focal deficit present.  ?   Mental Status: She is alert.  ?Psychiatric:     ?   Mood and Affect: Mood normal.  ? ? ?ED Results / Procedures / Treatments   ?Labs ?(all labs ordered are listed, but only abnormal results are displayed) ?Labs Reviewed - No data to display ? ?EKG ?None ? ?Radiology ?CT Knee Left Wo Contrast ? ?Result Date: 05/11/2021 ?CLINICAL DATA:  Left knee pain for 7 days.  No known injury. EXAM: CT OF THE LEFT KNEE WITHOUT CONTRAST TECHNIQUE: Multidetector CT imaging of the left knee was performed according to the standard protocol. Multiplanar CT image reconstructions were also generated. RADIATION DOSE REDUCTION: This exam was performed according to the departmental dose-optimization program which includes automated exposure control, adjustment  of the mA and/or kV according to patient size and/or use of iterative reconstruction technique. COMPARISON:  X-ray 05/11/2021 FINDINGS: Bones/Joint/Cartilage No acute fracture. No malalignment. Knee joint spaces are relatively well preserved. Tricompartmental chondrocalcinosis. Trace knee joint effusion. No fat-fluid level. Tiny Baker's cyst. Bones are mildly demineralized. No focal bone lesion. Ligaments Suboptimally assessed by CT. Muscles and Tendons No acute musculotendinous abnormality by CT. Soft tissues Calcification along the peripheral margin of the medial tibial plateau described radiographically corresponds to calcification within the distal semimembranosus tendon (series 7, image 145). No soft tissue swelling or fluid collections. Venous varicosities are  present superficially. IMPRESSION: 1. No acute fracture or malalignment of the left knee. 2. Tricompartmental chondrocalcinosis with trace knee joint effusion. 3. Calcification along the peripheral margin of the medial tibial plateau described radiographically corresponds to calcification within the distal semimembranosus tendon. Electronically Signed   By: Duanne Guess D.O.   On: 05/11/2021 13:32  ? ?US Venous Img Lower  Left (DVT Study) ? ?Result Date: 05/11/2021 ?CLINICAL DATA:  Left leg pain EXAM: LEFT LOWER EXTREMITY VENOUS DOPPLER ULTRASOUND TECHNIQUE: Gray-scale sonography with compression, as well as color and duplex ultrasound, were performed to evaluate the deep venous system(s) from the level of the common femoral vein through the popliteal and proximal calf veins. COMPARISON:  08/09/2017 FINDINGS: VENOUS Normal compressibility of the common femoral, superficial femoral, and popliteal veins, as well as the visualized calf veins. Visualized portions of profunda femoral vein and great saphenous vein unremarkable. No filling defects to suggest DVT on grayscale or color Doppler imaging. Doppler waveforms show normal direction of venous flow,  normal respiratory plasticity and response to augmentation. Limited views of the contralateral common femoral vein are unremarkable. OTHER Prominent inguinal lymph nodes are identified which measure up to 1.

## 2021-05-11 NOTE — Discharge Instructions (Signed)
Please follow-up with your orthopedic doctor about your knee pain.  Recommend taking Tylenol and anti-inflammatory such as Motrin or naproxen as needed for pain control.  If you develop increased swelling, pain inability to walk, or other new concerning symptom, return to ER for reassessment. ?

## 2021-05-11 NOTE — ED Notes (Signed)
Patient transported to CT 

## 2021-05-11 NOTE — ED Triage Notes (Signed)
Pt arrives pov, slow gait with c/o left knee pain x 7 days. Denies injury ?

## 2021-05-14 DIAGNOSIS — Z0000 Encounter for general adult medical examination without abnormal findings: Secondary | ICD-10-CM | POA: Diagnosis not present

## 2021-05-14 DIAGNOSIS — E1122 Type 2 diabetes mellitus with diabetic chronic kidney disease: Secondary | ICD-10-CM | POA: Diagnosis not present

## 2021-05-16 ENCOUNTER — Encounter: Payer: Self-pay | Admitting: Orthopaedic Surgery

## 2021-05-16 ENCOUNTER — Ambulatory Visit: Payer: BC Managed Care – PPO | Admitting: Orthopaedic Surgery

## 2021-05-16 DIAGNOSIS — M1712 Unilateral primary osteoarthritis, left knee: Secondary | ICD-10-CM | POA: Diagnosis not present

## 2021-05-16 MED ORDER — LIDOCAINE HCL 1 % IJ SOLN
2.0000 mL | INTRAMUSCULAR | Status: AC | PRN
  Administered 2021-05-16: 2 mL

## 2021-05-16 MED ORDER — METHYLPREDNISOLONE ACETATE 40 MG/ML IJ SUSP
40.0000 mg | INTRAMUSCULAR | Status: AC | PRN
  Administered 2021-05-16: 40 mg via INTRA_ARTICULAR

## 2021-05-16 MED ORDER — BUPIVACAINE HCL 0.5 % IJ SOLN
2.0000 mL | INTRAMUSCULAR | Status: AC | PRN
  Administered 2021-05-16: 2 mL via INTRA_ARTICULAR

## 2021-05-16 NOTE — Progress Notes (Signed)
? ?Office Visit Note ?  ?Patient: Kristin Anthony           ?Date of Birth: 1950/11/21           ?MRN: [id number] ?Visit Date: 05/16/2021 ?             ?Requested by: Creola Corn, MD ?[address] ?[place],  [place] [address] ?PCP: Creola Corn, MD ? ? ?Assessment & Plan: ?Visit Diagnoses:  ?1. Primary osteoarthritis of left knee   ? ? ?Plan: Impression is left knee OA exacerbation.  She does have chondrocalcinosis as well.  Joint spaces are still relatively well-preserved.  Given her options she elected for a cortisone injection today.  She tolerated this well.  We will see her back as needed. ? ?Follow-Up Instructions: No follow-ups on file.  ? ?Orders:  ?No orders of the defined types were placed in this encounter. ? ?No orders of the defined types were placed in this encounter. ? ? ? ? Procedures: ?Large Joint Inj: L knee on 05/16/2021 8:28 AM ?Details: 22 G needle ?Medications: 2 mL bupivacaine 0.5 %; 2 mL lidocaine 1 %; 40 mg methylPREDNISolone acetate 40 MG/ML ?Outcome: tolerated well, no immediate complications ?Patient was prepped and draped in the usual sterile fashion.  ? ? ? ? ?Clinical Data: ?No additional findings. ? ? ?Subjective: ?Chief Complaint  ?Patient presents with  ? Left Knee - Pain  ? ? ?HPI ? ?Kristin Anthony comes in today for follow-up from the ER for acute onset of left knee pain of insidious onset.  She had CT scan and Doppler done which were negative.  Show chondrocalcinosis and osteoarthritis.  Her right knee has function well since the knee replacement about 4 years ago.  Denies any fevers or chills. ? ?Review of Systems  ?Constitutional: Negative.   ?HENT: Negative.    ?Eyes: Negative.   ?Respiratory: Negative.    ?Cardiovascular: Negative.   ?Endocrine: Negative.   ?Musculoskeletal: Negative.   ?Neurological: Negative.   ?Hematological: Negative.   ?Psychiatric/Behavioral: Negative.    ?All other systems reviewed and are negative. ? ? ?Objective: ?Vital Signs: There were no vitals taken for this  visit. ? ?Physical Exam ?Vitals and nursing note reviewed.  ?Constitutional:   ?   Appearance: She is well-developed.  ?Pulmonary:  ?   Effort: Pulmonary effort is normal.  ?Skin: ?   General: Skin is warm.  ?   Capillary Refill: Capillary refill takes less than 2 seconds.  ?Neurological:  ?   Mental Status: She is alert and oriented to person, place, and time.  ?Psychiatric:     ?   Behavior: Behavior normal.     ?   Thought Content: Thought content normal.     ?   Judgment: Judgment normal.  ? ? ?Ortho Exam ? ?Examination left knee shows preserved range of motion with moderate pain throughout arc of motion.  Trace effusion.  Collaterals and cruciates are stable.  No signs of infection. ? ?Specialty Comments:  ?No specialty comments available. ? ?Imaging: ?No results found. ? ? ?PMFS History: ?Patient Active Problem List  ? Diagnosis Date Noted  ? Primary osteoarthritis of left knee 05/16/2021  ? 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 Unilateral primary osteoarthritis, left knee 10/07/2016  ? Chronic pain of both knees 10/07/2016  ? Unilateral primary osteoarthritis, right knee 03/18/2016  ? Closed fracture of tuft of distal phalanx of right thumb 08/31/2012  ? ?Past Medical History:  ?Diagnosis Date  ? Arthritis   ?  Depression   ? Diabetes mellitus   ? DVT (deep venous thrombosis) (HCC)   ? High cholesterol   ? History of hiatal hernia   ? Hypertension   ?  ?Family History  ?Problem Relation Age of Onset  ? Heart attack Mother   ? Diabetes Father   ? Hypertension Father   ? Hypertension Sister   ? Diabetes Sister   ? Hypertension Brother   ? Diabetes Brother   ? Hyperlipidemia Neg Hx   ? Sudden death Neg Hx   ?  ?Past Surgical History:  ?Procedure Laterality Date  ? ABDOMINAL HYSTERECTOMY    ? APPENDECTOMY    ? BACK SURGERY    ? KNEE SURGERY    ? TOTAL KNEE ARTHROPLASTY Right 07/13/2017  ? TOTAL KNEE ARTHROPLASTY Right 07/13/2017  ? Procedure: RIGHT TOTAL KNEE ARTHROPLASTY;  Surgeon: Tarry Kos, MD;   Location: [facility] OR;  Service: Orthopedics;  Laterality: Right;  ? ?Social History  ? ?Occupational History  ? Not on file  ?Tobacco Use  ? Smoking status: Never  ? Smokeless tobacco: Never  ?Vaping Use  ? Vaping Use: Never used  ?Substance and Sexual Activity  ? Alcohol use: No  ? Drug use: No  ? Sexual activity: Not on file  ? ? ? ? ? ? ?

## 2021-05-27 ENCOUNTER — Telehealth: Payer: Self-pay | Admitting: Orthopaedic Surgery

## 2021-05-27 ENCOUNTER — Other Ambulatory Visit: Payer: Self-pay | Admitting: Physician Assistant

## 2021-05-27 NOTE — Telephone Encounter (Signed)
Pt was called and informed and stated understanding  

## 2021-05-27 NOTE — Telephone Encounter (Signed)
I can send in something like tramadol or tylenol 3 if she would like

## 2021-05-27 NOTE — Telephone Encounter (Signed)
Patient would like medication that will help with pain and swelling dealing with arthritis, patient would like to try Rinvoq. The others are not working very well. ?

## 2021-05-27 NOTE — Telephone Encounter (Signed)
We can send in a rx nsaid like mobic or voltaren.  I believe the drug she mentioned is for things like RA and other autoimmune disease

## 2021-05-27 NOTE — Telephone Encounter (Signed)
Kristin Anthony- [employer] in [place] ? ?Patient wants Tramadol sent in. ?Thanks! ? ?

## 2021-05-27 NOTE — Telephone Encounter (Signed)
Please advise 

## 2021-05-27 NOTE — Telephone Encounter (Signed)
Pt called and states she feels like her arthritis is moving around in her leg and she is in a lot of pain. She was wondering if she could get something like "Rinvoq" for her pain and swelling. ? ?Cb [phone]  ?

## 2021-05-27 NOTE — Telephone Encounter (Signed)
I cannot write rinvoq.  Again, this is for RA.  She can talk to her pcp about this med if she would like.  Happy to send in mobic, voltaren, ibuprofen, or other nsaids

## 2021-05-28 ENCOUNTER — Other Ambulatory Visit: Payer: Self-pay | Admitting: Physician Assistant

## 2021-05-28 MED ORDER — TRAMADOL HCL 50 MG PO TABS: 50.0000 mg | ORAL_TABLET | Freq: Two times a day (BID) | ORAL | 2 refills | Status: DC | PRN

## 2021-05-28 NOTE — Telephone Encounter (Signed)
Sent in tramadol

## 2021-05-30 DIAGNOSIS — M2500 Hemarthrosis, unspecified joint: Secondary | ICD-10-CM | POA: Diagnosis not present

## 2021-05-30 DIAGNOSIS — R52 Pain, unspecified: Secondary | ICD-10-CM | POA: Diagnosis not present

## 2021-06-12 ENCOUNTER — Ambulatory Visit: Payer: BC Managed Care – PPO | Admitting: Orthopaedic Surgery

## 2021-06-12 ENCOUNTER — Telehealth: Payer: Self-pay

## 2021-06-12 ENCOUNTER — Encounter: Payer: Self-pay | Admitting: Orthopaedic Surgery

## 2021-06-12 DIAGNOSIS — M1712 Unilateral primary osteoarthritis, left knee: Secondary | ICD-10-CM | POA: Diagnosis not present

## 2021-06-12 MED ORDER — CELECOXIB 200 MG PO CAPS: 200.0000 mg | ORAL_CAPSULE | Freq: Two times a day (BID) | ORAL | 3 refills | Status: DC

## 2021-06-12 NOTE — Telephone Encounter (Signed)
Please submit for left knee gel inj °

## 2021-06-12 NOTE — Progress Notes (Signed)
Office Visit Note   Patient: Kristin Anthony           Date of Birth: 1950-12-27           MRN: [id number] Visit Date: 06/12/2021              Requested by: Creola Corn, MD [address] [place],  [place] [address] PCP: Creola Corn, MD   Assessment & Plan: Visit Diagnoses:  1. Primary osteoarthritis of left knee     Plan: Impression is left knee OA and chondrocalcinosis.  We will see if Visco will give relief.  Prescription for Celebrex.  She was told not to take any other NSAIDs.  Follow-up for the gel injection.  This patient is diagnosed with osteoarthritis of the knee(s).    Radiographs show evidence of joint space narrowing, osteophytes, subchondral sclerosis and/or subchondral cysts.  This patient has knee pain which interferes with functional and activities of daily living.    This patient has experienced inadequate response, adverse effects and/or intolerance with conservative treatments such as acetaminophen, NSAIDS, topical creams, physical therapy or regular exercise, knee bracing and/or weight loss.   This patient has experienced inadequate response or has a contraindication to intra articular steroid injections for at least 3 months.   This patient is not scheduled to have a total knee replacement within 6 months of starting treatment with viscosupplementation.  Follow-Up Instructions: No follow-ups on file.   Orders:  No orders of the defined types were placed in this encounter.  Meds ordered this encounter  Medications   celecoxib (CELEBREX) 200 MG capsule    Sig: Take 1 capsule (200 mg total) by mouth 2 (two) times daily.    Dispense:  30 capsule    Refill:  3      Procedures: No procedures performed   Clinical Data: No additional findings.   Subjective: Chief Complaint  Patient presents with   Left Knee - Pain, Follow-up    HPI [name] comes in today for chronic left knee pain.  We injected with cortisone earlier this month from which she  received no relief.  Went to the ED recently and had a CT scan which showed tricompartmental OA and chondrocalcinosis.  Recently had blood work at PCPs office which was negative for inflammatory arthritis.  Currently takes diclofenac and meloxicam and ibuprofen.  Review of Systems  Constitutional: Negative.   HENT: Negative.    Eyes: Negative.   Respiratory: Negative.    Cardiovascular: Negative.   Endocrine: Negative.   Musculoskeletal: Negative.   Neurological: Negative.   Hematological: Negative.   Psychiatric/Behavioral: Negative.    All other systems reviewed and are negative.   Objective: Vital Signs: There were no vitals taken for this visit.  Physical Exam Vitals and nursing note reviewed.  Constitutional:      Appearance: She is well-developed.  Pulmonary:     Effort: Pulmonary effort is normal.  Skin:    General: Skin is warm.     Capillary Refill: Capillary refill takes less than 2 seconds.  Neurological:     Mental Status: She is alert and oriented to person, place, and time.  Psychiatric:        Behavior: Behavior normal.        Thought Content: Thought content normal.        Judgment: Judgment normal.    Ortho Exam Examination of left knee shows trace effusion.  Painful range of motion.  Collaterals and cruciates are stable.  No  evidence of infection.  Specialty Comments:  No specialty comments available.  Imaging: No results found.   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   Unilateral primary osteoarthritis, left knee 10/07/2016   Chronic pain of both knees 10/07/2016   Unilateral primary osteoarthritis, right knee 03/18/2016   Closed fracture of tuft of distal phalanx of right thumb 08/31/2012   Past Medical History:  Diagnosis Date   Arthritis    Depression    Diabetes mellitus    DVT (deep venous thrombosis) (HCC)    High  cholesterol    History of hiatal hernia    Hypertension     Family History  Problem Relation Age of Onset   Heart attack Mother    Diabetes Father    Hypertension Father    Hypertension Sister    Diabetes Sister    Hypertension Brother    Diabetes Brother    Hyperlipidemia Neg Hx    Sudden death Neg Hx     Past Surgical History:  Procedure Laterality Date   ABDOMINAL HYSTERECTOMY     APPENDECTOMY     BACK SURGERY     KNEE SURGERY     TOTAL KNEE ARTHROPLASTY Right 07/13/2017   TOTAL KNEE ARTHROPLASTY Right 07/13/2017   Procedure: RIGHT TOTAL KNEE ARTHROPLASTY;  Surgeon: Tarry Kos, MD;  Location: [facility] OR;  Service: Orthopedics;  Laterality: Right;   Social History   Occupational History   Not on file  Tobacco Use   Smoking status: Never   Smokeless tobacco: Never  Vaping Use   Vaping Use: Never used  Substance and Sexual Activity   Alcohol use: No   Drug use: No   Sexual activity: Not on file

## 2021-06-12 NOTE — Telephone Encounter (Signed)
Noted  

## 2021-06-14 ENCOUNTER — Telehealth: Payer: Self-pay

## 2021-06-14 ENCOUNTER — Telehealth: Payer: Self-pay | Admitting: Orthopaedic Surgery

## 2021-06-14 NOTE — Telephone Encounter (Signed)
Talked with patient and advised her how the gel injection process works.  Voiced that she understands.

## 2021-06-14 NOTE — Telephone Encounter (Signed)
Pt called requesting update about approval of gel injection for her knee. Please call pt at [phone].

## 2021-06-14 NOTE — Telephone Encounter (Signed)
VOB submitted for SynviscOne, left knee. BV pending. 

## 2021-06-17 ENCOUNTER — Telehealth: Payer: Self-pay | Admitting: Orthopaedic Surgery

## 2021-06-17 ENCOUNTER — Telehealth: Payer: Self-pay

## 2021-06-17 NOTE — Telephone Encounter (Signed)
Patient's insurance does not require an authorization for SynivscOne, left knee due to patient's insurance [employer], [employer] not covering gel injections at all. Talked with patient again and advised her of the information above.  Patient voiced that she understands.  Provided patient with Jcode to call her insurance to clarify.

## 2021-06-17 NOTE — Telephone Encounter (Signed)
Submitted for triVisc, left knee due to gel injection not being covered by [employer].  Patient is aware that she will receive a call from [facility] to pay for gel injections.

## 2021-06-17 NOTE — Telephone Encounter (Signed)
Shanta from [employer] called concerning pt's gel injection. She states to contact pt about pre auth for gel injection. Shanta states Pre Auth Dept phone number is [phone].

## 2021-06-19 NOTE — Telephone Encounter (Signed)
Pt called and would like to know the status of her injection

## 2021-06-19 NOTE — Telephone Encounter (Signed)
Talked with patient and advised her that I received a fax from [facility] stating that order has been sent to the pharmacy to be filled and that the medication will be shipped within the next couple of  business days.  Patient voiced that she understands.

## 2021-06-21 ENCOUNTER — Telehealth: Payer: Self-pay | Admitting: Orthopaedic Surgery

## 2021-06-21 NOTE — Telephone Encounter (Signed)
Please call the pt regarding Gel Injections

## 2021-06-21 NOTE — Telephone Encounter (Signed)
Talked with patient and advised her that I haven't received gel injection from [facility] yet.  Patient voiced that she understands.

## 2021-06-25 ENCOUNTER — Telehealth: Payer: Self-pay

## 2021-06-25 NOTE — Telephone Encounter (Signed)
Received TriVisc injections for patients left knee.  Appts.have been scheduled.

## 2021-07-03 ENCOUNTER — Ambulatory Visit: Payer: BC Managed Care – PPO | Admitting: Orthopaedic Surgery

## 2021-07-03 DIAGNOSIS — M1712 Unilateral primary osteoarthritis, left knee: Secondary | ICD-10-CM | POA: Diagnosis not present

## 2021-07-03 MED ORDER — LIDOCAINE HCL 1 % IJ SOLN
2.0000 mL | INTRAMUSCULAR | Status: AC | PRN
  Administered 2021-07-03: 2 mL

## 2021-07-03 MED ORDER — SODIUM HYALURONATE (VISCOSUP) 25 MG/2.5ML IX SOSY
25.0000 mg | PREFILLED_SYRINGE | INTRA_ARTICULAR | Status: AC | PRN
  Administered 2021-07-03: 25 mg via INTRA_ARTICULAR

## 2021-07-03 MED ORDER — BUPIVACAINE HCL 0.25 % IJ SOLN
2.0000 mL | INTRAMUSCULAR | Status: AC | PRN
  Administered 2021-07-03: 2 mL via INTRA_ARTICULAR

## 2021-07-03 NOTE — Progress Notes (Signed)
Office Visit Note   Patient: Kristin Anthony           Date of Birth: [birth year]           MRN: [id number] Visit Date: 07/03/2021              Requested by: Creola Corn, MD [address] [place],  [place] [address] PCP: Creola Corn, MD   Assessment & Plan: Visit Diagnoses:  1. Unilateral primary osteoarthritis, left knee     Plan: Impression is left knee osteoarthritis.  Today, proceeded with try this number 1 injection.  She tolerated this well.  She will follow-up next week for try this number 2 injection.  Call with concerns or questions.  Follow-Up Instructions: Return in about 1 week (around 07/10/2021).   Orders:  Orders Placed This Encounter  Procedures   Large Joint Inj: L knee   No orders of the defined types were placed in this encounter.     Procedures: Large Joint Inj: L knee on 07/03/2021 8:32 AM Indications: pain Details: 22 G needle, anterolateral approach Medications: 2 mL lidocaine 1 %; 2 mL bupivacaine 0.25 %; 25 mg Sodium Hyaluronate (Viscosup) 25 MG/2.5ML      Clinical Data: No additional findings.   Subjective: Chief Complaint  Patient presents with   Left Knee - Pain    HPI patient is a pleasant 71 year old female who comes in today for left knee try Visco number 1 injection.  History of underlying osteoarthritis.  She has had cortisone injections in the past without significant relief.  No previous viscosupplementation injection.     Objective: Vital Signs: There were no vitals taken for this visit.    Ortho Exam unchanged left knee exam  Specialty Comments:  No specialty comments available.  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   Unilateral primary osteoarthritis, left knee 10/07/2016   Chronic pain of both knees 10/07/2016   Unilateral primary osteoarthritis, right knee  03/18/2016   Closed fracture of tuft of distal phalanx of right thumb 08/31/2012   Past Medical History:  Diagnosis Date   Arthritis    Depression    Diabetes mellitus    DVT (deep venous thrombosis) (HCC)    High cholesterol    History of hiatal hernia    Hypertension     Family History  Problem Relation Age of Onset   Heart attack Mother    Diabetes Father    Hypertension Father    Hypertension Sister    Diabetes Sister    Hypertension Brother    Diabetes Brother    Hyperlipidemia Neg Hx    Sudden death Neg Hx     Past Surgical History:  Procedure Laterality Date   ABDOMINAL HYSTERECTOMY     APPENDECTOMY     BACK SURGERY     KNEE SURGERY     TOTAL KNEE ARTHROPLASTY Right 07/13/2017   TOTAL KNEE ARTHROPLASTY Right 07/13/2017   Procedure: RIGHT TOTAL KNEE ARTHROPLASTY;  Surgeon: Tarry Kos, MD;  Location: [facility] OR;  Service: Orthopedics;  Laterality: Right;   Social History   Occupational History   Not on file  Tobacco Use   Smoking status: Never   Smokeless tobacco: Never  Vaping Use   Vaping Use: Never used  Substance and Sexual Activity   Alcohol use: No   Drug use:  No   Sexual activity: Not on file

## 2021-07-10 ENCOUNTER — Encounter: Payer: Self-pay | Admitting: Orthopaedic Surgery

## 2021-07-10 ENCOUNTER — Ambulatory Visit (INDEPENDENT_AMBULATORY_CARE_PROVIDER_SITE_OTHER): Payer: BC Managed Care – PPO | Admitting: Orthopaedic Surgery

## 2021-07-10 DIAGNOSIS — M1712 Unilateral primary osteoarthritis, left knee: Secondary | ICD-10-CM

## 2021-07-10 MED ORDER — SODIUM HYALURONATE (VISCOSUP) 25 MG/2.5ML IX SOSY
25.0000 mg | PREFILLED_SYRINGE | INTRA_ARTICULAR | Status: AC | PRN
  Administered 2021-07-10: 25 mg via INTRA_ARTICULAR

## 2021-07-10 NOTE — Progress Notes (Signed)
   Office Visit Note   Patient: Kristin Anthony           Date of Birth: 1950-02-14           MRN: [id number] Visit Date: 07/10/2021              Requested by: Creola Corn, MD [address] [place],  [place] [address] PCP: Creola Corn, MD   Assessment & Plan: Visit Diagnoses:  1. Primary osteoarthritis of left knee     Plan: Patina is here for trivisc injection for the left knee today.  Tolerated well.  Follow up next week for 3rd injection.  IM toradol injection administered today as well.    Follow-Up Instructions: No follow-ups on file.   Orders:  No orders of the defined types were placed in this encounter.  No orders of the defined types were placed in this encounter.     Procedures: Large Joint Inj: L knee on 07/10/2021 7:57 AM Indications: pain Details: 22 G needle  Arthrogram: No  Medications: 25 mg Sodium Hyaluronate (Viscosup) 25 MG/2.5ML Outcome: tolerated well, no immediate complications Patient was prepped and draped in the usual sterile fashion.     Clinical Data: No additional findings.   Subjective: No chief complaint on file.   HPI  Review of Systems   Objective: Vital Signs: There were no vitals taken for this visit.  Physical Exam  Ortho Exam  Specialty Comments:  No specialty comments available.  Imaging: No results found.   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   Unilateral primary osteoarthritis, left knee 10/07/2016   Chronic pain of both knees 10/07/2016   Unilateral primary osteoarthritis, right knee 03/18/2016   Closed fracture of tuft of distal phalanx of right thumb 08/31/2012   Past Medical History:  Diagnosis Date   Arthritis    Depression    Diabetes mellitus    DVT (deep venous thrombosis) (HCC)    High cholesterol    History of hiatal hernia    Hypertension     Family History   Problem Relation Age of Onset   Heart attack Mother    Diabetes Father    Hypertension Father    Hypertension Sister    Diabetes Sister    Hypertension Brother    Diabetes Brother    Hyperlipidemia Neg Hx    Sudden death Neg Hx     Past Surgical History:  Procedure Laterality Date   ABDOMINAL HYSTERECTOMY     APPENDECTOMY     BACK SURGERY     KNEE SURGERY     TOTAL KNEE ARTHROPLASTY Right 07/13/2017   TOTAL KNEE ARTHROPLASTY Right 07/13/2017   Procedure: RIGHT TOTAL KNEE ARTHROPLASTY;  Surgeon: Tarry Kos, MD;  Location: [facility] OR;  Service: Orthopedics;  Laterality: Right;   Social History   Occupational History   Not on file  Tobacco Use   Smoking status: Never   Smokeless tobacco: Never  Vaping Use   Vaping Use: Never used  Substance and Sexual Activity   Alcohol use: No   Drug use: No   Sexual activity: Not on file

## 2021-07-17 ENCOUNTER — Ambulatory Visit (INDEPENDENT_AMBULATORY_CARE_PROVIDER_SITE_OTHER): Payer: BC Managed Care – PPO | Admitting: Orthopaedic Surgery

## 2021-07-17 DIAGNOSIS — M1712 Unilateral primary osteoarthritis, left knee: Secondary | ICD-10-CM | POA: Diagnosis not present

## 2021-07-17 NOTE — Progress Notes (Signed)
Office Visit Note   Patient: Kristin Anthony           Date of Birth: Feb 07, 1950           MRN: [id number] Visit Date: 07/17/2021              Requested by: Creola Corn, MD [address] [place],  [place] [address] PCP: Creola Corn, MD   Assessment & Plan: Visit Diagnoses:  1. Unilateral primary osteoarthritis, left knee     Plan: Toradol IM 60 mg given buttocks upper outer quadrant.  Visco injection left knee this was her third.  She will follow-up with Dr. Roda Shutters she states she will call for an appointment.  Follow-Up Instructions: No follow-ups on file.   Orders:  No orders of the defined types were placed in this encounter.  No orders of the defined types were placed in this encounter.     Procedures: Large Joint Inj: L knee on 07/17/2021 9:24 AM Indications: joint swelling and pain Details: 22 G 1.5 in needle, anterolateral approach  Arthrogram: No  Medications: 0.5 mL lidocaine 1 %; 25 mg Sodium Hyaluronate (Viscosup) 25 MG/2.5ML Outcome: tolerated well, no immediate complications Procedure, treatment alternatives, risks and benefits explained, specific risks discussed. Consent was given by the patient. Immediately prior to procedure a time out was called to verify the correct patient, procedure, equipment, support staff and site/side marked as required. Patient was prepped and draped in the usual sterile fashion.       Clinical Data: No additional findings.   Subjective: Chief Complaint  Patient presents with   Left Knee - Follow-up    HPI patient normally seen by Dr.Xu and is here for her third Visco injection left knee. Trivisc.  She has had previous right total knee arthroplasty doing well.  Left knee shows some chondrocalcinosis.  She has not had recent cortisone injection in her knee.  Patient is requesting a repeat Toradol IM injection which gave her 2 days of great relief.  She states she is not sure the Visco injection left knee has been effective.   Patient is seeing me today in absence of Dr.Xu from the office  Review of Systems unchanged   Objective: Vital Signs: There were no vitals taken for this visit.  Physical Exam unchanged  Ortho Exam no site reaction from previous left knee injection.  Specialty Comments:  No specialty comments available.  Imaging: No results found.   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   Unilateral primary osteoarthritis, left knee 10/07/2016   Chronic pain of both knees 10/07/2016   Unilateral primary osteoarthritis, right knee 03/18/2016   Closed fracture of tuft of distal phalanx of right thumb 08/31/2012   Past Medical History:  Diagnosis Date   Arthritis    Depression    Diabetes mellitus    DVT (deep venous thrombosis) (HCC)    High cholesterol    History of hiatal hernia    Hypertension     Family History  Problem Relation Age of Onset   Heart attack Mother    Diabetes Father    Hypertension Father    Hypertension Sister    Diabetes Sister    Hypertension Brother    Diabetes Brother    Hyperlipidemia Neg Hx    Sudden death Neg Hx     Past Surgical History:  Procedure Laterality Date  ABDOMINAL HYSTERECTOMY     APPENDECTOMY     BACK SURGERY     KNEE SURGERY     TOTAL KNEE ARTHROPLASTY Right 07/13/2017   TOTAL KNEE ARTHROPLASTY Right 07/13/2017   Procedure: RIGHT TOTAL KNEE ARTHROPLASTY;  Surgeon: Tarry Kos, MD;  Location: [facility] OR;  Service: Orthopedics;  Laterality: Right;   Social History   Occupational History   Not on file  Tobacco Use   Smoking status: Never   Smokeless tobacco: Never  Vaping Use   Vaping Use: Never used  Substance and Sexual Activity   Alcohol use: No   Drug use: No   Sexual activity: Not on file

## 2021-07-18 MED ORDER — SODIUM HYALURONATE (VISCOSUP) 25 MG/2.5ML IX SOSY
25.0000 mg | PREFILLED_SYRINGE | INTRA_ARTICULAR | Status: AC | PRN
  Administered 2021-07-17: 25 mg via INTRA_ARTICULAR

## 2021-07-18 MED ORDER — LIDOCAINE HCL 1 % IJ SOLN
0.5000 mL | INTRAMUSCULAR | Status: AC | PRN
  Administered 2021-07-17: .5 mL

## 2021-07-25 ENCOUNTER — Encounter: Payer: Self-pay | Admitting: Orthopaedic Surgery

## 2021-07-25 ENCOUNTER — Ambulatory Visit (INDEPENDENT_AMBULATORY_CARE_PROVIDER_SITE_OTHER): Payer: BC Managed Care – PPO | Admitting: Orthopaedic Surgery

## 2021-07-25 ENCOUNTER — Ambulatory Visit (INDEPENDENT_AMBULATORY_CARE_PROVIDER_SITE_OTHER): Payer: BC Managed Care – PPO

## 2021-07-25 VITALS — Ht 63.0 in | Wt 206.0 lb

## 2021-07-25 DIAGNOSIS — M1712 Unilateral primary osteoarthritis, left knee: Secondary | ICD-10-CM

## 2021-07-25 DIAGNOSIS — E119 Type 2 diabetes mellitus without complications: Secondary | ICD-10-CM | POA: Diagnosis not present

## 2021-07-25 MED ORDER — LIDOCAINE HCL 1 % IJ SOLN
2.0000 mL | INTRAMUSCULAR | Status: AC | PRN
  Administered 2021-07-25: 2 mL

## 2021-07-25 MED ORDER — BUPIVACAINE HCL 0.5 % IJ SOLN
2.0000 mL | INTRAMUSCULAR | Status: AC | PRN
  Administered 2021-07-25: 2 mL via INTRA_ARTICULAR

## 2021-07-25 NOTE — Progress Notes (Signed)
Office Visit Note   Patient: Kristin Anthony           Date of Birth: 1950/03/27           MRN: [id number] Visit Date: 07/25/2021              Requested by: Creola Corn, MD [address] [place],  [place] [address] PCP: Creola Corn, MD   Assessment & Plan: Visit Diagnoses:  1. Primary osteoarthritis of left knee     Plan: Impression is end-stage left knee DJD.  At this point Kristin Anthony has exhausted all conservative management and is ready to proceed with a left total knee replacement.  She did very well from the right knee replacement.  She states her previous A1c 3 months ago was 7.9.  We we will redraw the A1c today and has to be less than 7.8 before we can proceed with surgery.  We did a Toradol injection in her knee today.  Follow-Up Instructions: No follow-ups on file.   Orders:  Orders Placed This Encounter  Procedures   XR KNEE 3 VIEW LEFT   Hemoglobin A1C   No orders of the defined types were placed in this encounter.     Procedures: Large Joint Inj: L knee on 07/25/2021 11:26 AM Details: 22 G needle Medications: 2 mL bupivacaine 0.5 %; 2 mL lidocaine 1 % Outcome: tolerated well, no immediate complications Patient was prepped and draped in the usual sterile fashion.       Clinical Data: No additional findings.   Subjective: Chief Complaint  Patient presents with   Left Knee - Pain    HPI Kristin Anthony's 71 year old female comes in for follow-up of left knee pain.  She is well-known to me.  Is status post right total knee replacement about 4 years ago.  She has completed gel injections and has felt no relief.  At this point she has decided she wants to move forward with a left total knee replacement.  Pain is interfering with ADLs and sleeping.  Review of Systems  Constitutional: Negative.   HENT: Negative.    Eyes: Negative.   Respiratory: Negative.    Cardiovascular: Negative.   Endocrine: Negative.   Musculoskeletal: Negative.   Neurological: Negative.    Hematological: Negative.   Psychiatric/Behavioral: Negative.    All other systems reviewed and are negative.    Objective: Vital Signs: Ht 5\' 3"  (1.6 m)   Wt 206 lb (93.4 kg)   BMI 36.49 kg/m   Physical Exam Vitals and nursing note reviewed.  Constitutional:      Appearance: She is well-developed.  HENT:     Head: Atraumatic.     Nose: Nose normal.  Eyes:     Extraocular Movements: Extraocular movements intact.  Cardiovascular:     Pulses: Normal pulses.  Pulmonary:     Effort: Pulmonary effort is normal.  Abdominal:     Palpations: Abdomen is soft.  Musculoskeletal:     Cervical back: Neck supple.  Skin:    General: Skin is warm.     Capillary Refill: Capillary refill takes less than 2 seconds.  Neurological:     Mental Status: She is alert. Mental status is at baseline.  Psychiatric:        Behavior: Behavior normal.        Thought Content: Thought content normal.        Judgment: Judgment normal.     Ortho Exam Examination of the left knee shows pain throughout arc  of motion with crepitus.  Collaterals and cruciates are stable.  Medial and lateral joint line tenderness.  Trace effusion. Specialty Comments:  No specialty comments available.  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  Tricompartmental DJD with bone-on-bone joint space narrowing of the medial compartment.    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   Unilateral primary osteoarthritis, left knee 10/07/2016   Chronic pain of both knees 10/07/2016   Unilateral primary osteoarthritis, right knee 03/18/2016   Closed fracture of tuft of distal phalanx of right thumb 08/31/2012   Past Medical History:  Diagnosis Date   Arthritis    Depression    Diabetes mellitus    DVT (deep venous thrombosis) (HCC)    High  cholesterol    History of hiatal hernia    Hypertension     Family History  Problem Relation Age of Onset   Heart attack Mother    Diabetes Father    Hypertension Father    Hypertension Sister    Diabetes Sister    Hypertension Brother    Diabetes Brother    Hyperlipidemia Neg Hx    Sudden death Neg Hx     Past Surgical History:  Procedure Laterality Date   ABDOMINAL HYSTERECTOMY     APPENDECTOMY     BACK SURGERY     KNEE SURGERY     TOTAL KNEE ARTHROPLASTY Right 07/13/2017   TOTAL KNEE ARTHROPLASTY Right 07/13/2017   Procedure: RIGHT TOTAL KNEE ARTHROPLASTY;  Surgeon: Tarry Kos, MD;  Location: [facility] OR;  Service: Orthopedics;  Laterality: Right;   Social History   Occupational History   Not on file  Tobacco Use   Smoking status: Never   Smokeless tobacco: Never  Vaping Use   Vaping Use: Never used  Substance and Sexual Activity   Alcohol use: No   Drug use: No   Sexual activity: Not on file

## 2021-07-26 ENCOUNTER — Other Ambulatory Visit: Payer: Self-pay | Admitting: Physician Assistant

## 2021-07-26 ENCOUNTER — Telehealth: Payer: Self-pay | Admitting: Orthopaedic Surgery

## 2021-07-26 LAB — HEMOGLOBIN A1C
Hgb A1c MFr Bld: 7.5 % of total Hgb — ABNORMAL HIGH (ref <5.7)
Mean Plasma Glucose: 169 mg/dL
eAG (mmol/L): 9.3 mmol/L

## 2021-07-26 MED ORDER — HYDROCODONE-ACETAMINOPHEN 5-325 MG PO TABS: 1.0000 | ORAL_TABLET | Freq: Two times a day (BID) | ORAL | 0 refills | Status: DC | PRN

## 2021-07-26 NOTE — Progress Notes (Signed)
A1c is good to go for surgery.  Thanks.

## 2021-07-26 NOTE — Telephone Encounter (Signed)
Spoke with patient. Relayed to her that it was 7.5 and that Norco was sent to pharmacy. She is waiting to hear if she can proceed with surgery.

## 2021-07-26 NOTE — Telephone Encounter (Signed)
I have sent in a small rx for norco to take sparingly  Xu, ok to proceed with surgery?  AIC yesterday was 7.5  Does debbie have a surgery sheet?

## 2021-07-26 NOTE — Telephone Encounter (Signed)
Patient called in wanting to know what were the results of her A1C reading and if she could get  a different prescription for her pain medication being the tramadol are not working and she is still in pain

## 2021-07-29 ENCOUNTER — Telehealth: Payer: Self-pay | Admitting: Orthopaedic Surgery

## 2021-07-29 NOTE — Telephone Encounter (Signed)
Yes she can proceed.  Just awaiting clearance

## 2021-07-29 NOTE — Telephone Encounter (Signed)
Patient has questions about surgery relating to blood clots.  She says she does not have a history of blood clots and states everything went well with her right total knee, however she is concerned of a blood clot because of a "surface clot" she experienced several years back.  She is asking if there is a need for blood thinners.   We are currently waiting on a clearance from Dr Creola Corn.  I explained to patient we will provide her with a solid date once we have received the clearance.

## 2021-07-30 NOTE — Telephone Encounter (Signed)
To be safe we will put her on xarelto for a month after surgery, then aspirin after that.

## 2021-08-02 ENCOUNTER — Ambulatory Visit: Payer: BC Managed Care – PPO | Admitting: Orthopaedic Surgery

## 2021-08-05 ENCOUNTER — Other Ambulatory Visit: Payer: Self-pay

## 2021-08-07 ENCOUNTER — Telehealth: Payer: Self-pay | Admitting: Orthopaedic Surgery

## 2021-08-07 NOTE — Telephone Encounter (Signed)
Patient calling to request a letter from Dr. Roda Shutters indicating her left total knee surgery is scheduled for 08-26-21.  The purpose of the letter is so the patient's husband Clova Morlock) will be out of work to help her during her recovery.  Patient's # [phone]

## 2021-08-08 NOTE — Telephone Encounter (Signed)
Will leave note up front for pick up. Patient aware.

## 2021-08-18 ENCOUNTER — Other Ambulatory Visit: Payer: Self-pay | Admitting: Physician Assistant

## 2021-08-18 MED ORDER — ONDANSETRON HCL 4 MG PO TABS: 4.0000 mg | ORAL_TABLET | Freq: Three times a day (TID) | ORAL | 0 refills | Status: AC | PRN

## 2021-08-18 MED ORDER — DOCUSATE SODIUM 100 MG PO CAPS: 100.0000 mg | ORAL_CAPSULE | Freq: Every day | ORAL | 2 refills | Status: AC | PRN

## 2021-08-18 MED ORDER — SULFAMETHOXAZOLE-TRIMETHOPRIM 800-160 MG PO TABS: 1.0000 | ORAL_TABLET | Freq: Two times a day (BID) | ORAL | 0 refills | Status: DC

## 2021-08-18 MED ORDER — METHOCARBAMOL 500 MG PO TABS: 500.0000 mg | ORAL_TABLET | Freq: Two times a day (BID) | ORAL | 2 refills | Status: AC | PRN

## 2021-08-18 MED ORDER — ASPIRIN 81 MG PO TBEC: 81.0000 mg | DELAYED_RELEASE_TABLET | Freq: Two times a day (BID) | ORAL | 0 refills | Status: DC

## 2021-08-18 MED ORDER — OXYCODONE-ACETAMINOPHEN 5-325 MG PO TABS: 1.0000 | ORAL_TABLET | Freq: Four times a day (QID) | ORAL | 0 refills | Status: AC | PRN

## 2021-08-19 ENCOUNTER — Telehealth: Payer: Self-pay

## 2021-08-19 NOTE — Telephone Encounter (Signed)
Toniann Fail with [name] called stating that Rx for Bactrim interacts with another Rx.  Would like a call back to discuss?  CB# [phone].  Please advise.  Thank you.

## 2021-08-20 ENCOUNTER — Other Ambulatory Visit: Payer: Self-pay | Admitting: Physician Assistant

## 2021-08-20 MED ORDER — CEPHALEXIN 500 MG PO CAPS: 500.0000 mg | ORAL_CAPSULE | Freq: Four times a day (QID) | ORAL | 0 refills | Status: AC

## 2021-08-20 NOTE — Pre-Procedure Instructions (Signed)
Surgical Instructions    Your procedure is scheduled on Monday, August 14th.  Report to [facility] Main Entrance "A" at 10:15 A.M., then check in with the Admitting office.  Call this number if you have problems the morning of surgery:  [phone]   If you have any questions prior to your surgery date call [phone]: Open Monday-Friday 8am-4pm    Remember:  Do not eat after midnight the night before your surgery  You may drink clear liquids until 9:15 a.m. the morning of your surgery.   Clear liquids allowed are: Water, Non-Citrus Juices (without pulp), Carbonated Beverages, Clear Tea, Black Coffee ONLY (NO MILK, CREAM OR POWDERED CREAMER of any kind), and Gatorade.  Patient Instructions   The day of surgery (if you have diabetes):  Drink ONE small 12 oz bottle of Gatorade G2 by 9:15 am the morning of surgery This bottle was given to you during your hospital  pre-op appointment visit.  Nothing else to drink after completing the  Small 12 oz bottle of Gatorade G2.         If you have questions, please contact your surgeon's office.     Take these medicines the morning of surgery with A SIP OF WATER:  hydrALAZINE (APRESOLINE)  LORazepam (ATIVAN)  pregabalin (LYRICA)    Take these medications as needed: HYDROcodone-acetaminophen (NORCO) methocarbamol (ROBAXIN)  ondansetron (ZOFRAN) traMADol (ULTRAM)  As of today, STOP taking any Aspirin (unless otherwise instructed by your surgeon) Aleve, Naproxen, Ibuprofen, Motrin, Advil, Goody's, BC's, all herbal medications, fish oil, and all vitamins. This includes: meloxicam (MOBIC).  WHAT DO I DO ABOUT MY DIABETES MEDICATION?  THE NIGHT BEFORE SURGERY, take 16 units of TOUJEO SOLOSTAR insulin.       THE MORNING OF SURGERY, take 16 units of TOUJEO SOLOSTAR insulin.  On the the morning of surgery if your CBG is greater than 220 mg/dL, you may take  of your sliding scale dose of insulin lispro (HUMALOG KWIKPEN)  insulin.   HOW TO MANAGE YOUR DIABETES BEFORE AND AFTER SURGERY  Why is it important to control my blood sugar before and after surgery? Improving blood sugar levels before and after surgery helps healing and can limit problems. A way of improving blood sugar control is eating a healthy diet by:  Eating less sugar and carbohydrates  Increasing activity/exercise  Talking with your doctor about reaching your blood sugar goals High blood sugars (greater than 180 mg/dL) can raise your risk of infections and slow your recovery, so you will need to focus on controlling your diabetes during the weeks before surgery. Make sure that the doctor who takes care of your diabetes knows about your planned surgery including the date and location.  How do I manage my blood sugar before surgery? Check your blood sugar at least 4 times a day, starting 2 days before surgery, to make sure that the level is not too high or low.  Check your blood sugar the morning of your surgery when you wake up and every 2 hours until you get to the Short Stay unit.  If your blood sugar is less than 70 mg/dL, you will need to treat for low blood sugar: Do not take insulin. Treat a low blood sugar (less than 70 mg/dL) with  cup of clear juice (cranberry or apple), 4 glucose tablets, OR glucose gel. Recheck blood sugar in 15 minutes after treatment (to make sure it is greater than 70 mg/dL). If your blood sugar is not greater than  70 mg/dL on recheck, call [phone] for further instructions. Report your blood sugar to the short stay nurse when you get to Short Stay.  If you are admitted to the hospital after surgery: Your blood sugar will be checked by the staff and you will probably be given insulin after surgery (instead of oral diabetes medicines) to make sure you have good blood sugar levels. The goal for blood sugar control after surgery is 80-180 mg/dL.          Preparing for surgery:   Do not wear jewelry or  makeup. Do not wear lotions, powders, perfumes or deodorant. Do not shave 48 hours prior to surgery.   Do not bring valuables to the hospital. Do not wear nail polish, gel polish, artificial nails, or any other type of covering on natural nails (fingers and toes) If you have artificial nails or gel coating that need to be removed by a nail salon, please have this removed prior to surgery. Artificial nails or gel coating may interfere with anesthesia's ability to adequately monitor your vital signs.  [facility] is not responsible for any belongings or valuables.    Do NOT Smoke (Tobacco/Vaping)  24 hours prior to your procedure  If you use a CPAP at night, you may bring your mask for your overnight stay.   Contacts, glasses, hearing aids, dentures or partials may not be worn into surgery, please bring cases for these belongings   For patients admitted to the hospital, discharge time will be determined by your treatment team.   Patients discharged the day of surgery will not be allowed to drive home, and someone needs to stay with them for 24 hours.   SURGICAL WAITING ROOM VISITATION Patients having surgery or a procedure may have no more than 2 support people in the waiting area - these visitors may rotate.   Children under the age of 66 must have an adult with them who is not the patient. If the patient needs to stay at the hospital during part of their recovery, the visitor guidelines for inpatient rooms apply. Pre-op nurse will coordinate an appropriate time for 1 support person to accompany patient in pre-op.  This support person may not rotate.   Please refer to the [facility] website for the visitor guidelines for Inpatients (after your surgery is over and you are in a regular room).    Special instructions:    Oral Hygiene is also important to reduce your risk of infection.  Remember - BRUSH YOUR TEETH THE MORNING OF SURGERY WITH YOUR REGULAR TOOTHPASTE   [place]-  Preparing For Surgery  Before surgery, you can play an important role. Because skin is not sterile, your skin needs to be as free of germs as possible. You can reduce the number of germs on your skin by washing with CHG (chlorahexidine gluconate) Soap before surgery.  CHG is an antiseptic cleaner which kills germs and bonds with the skin to continue killing germs even after washing.     Please do not use if you have an allergy to CHG or antibacterial soaps. If your skin becomes reddened/irritated stop using the CHG.  Do not shave (including legs and underarms) for at least 48 hours prior to first CHG shower. It is OK to shave your face.  Please follow these instructions carefully.     Shower the NIGHT BEFORE SURGERY and the MORNING OF SURGERY with CHG Soap.   If you chose to wash your hair, wash your hair  first as usual with your normal shampoo. After you shampoo, rinse your hair and body thoroughly to remove the shampoo.  Then [employer] and genitals (private parts) with your normal soap and rinse thoroughly to remove soap.  After that Use CHG Soap as you would any other liquid soap. You can apply CHG directly to the skin and wash gently with a scrungie or a clean washcloth.   Apply the CHG Soap to your body ONLY FROM THE NECK DOWN.  Do not use on open wounds or open sores. Avoid contact with your eyes, ears, mouth and genitals (private parts). Wash Face and genitals (private parts)  with your normal soap.   Wash thoroughly, paying special attention to the area where your surgery will be performed.  Thoroughly rinse your body with warm water from the neck down.  DO NOT shower/wash with your normal soap after using and rinsing off the CHG Soap.  Pat yourself dry with a CLEAN TOWEL.  Wear CLEAN PAJAMAS to bed the night before surgery  Place CLEAN SHEETS on your bed the night before your surgery  DO NOT SLEEP WITH PETS.   Day of Surgery:  Take a shower with CHG soap. Wear  Clean/Comfortable clothing the morning of surgery Do not apply any deodorants/lotions.   Remember to brush your teeth WITH YOUR REGULAR TOOTHPASTE.    If you received a COVID test during your pre-op visit, it is requested that you wear a mask when out in public, stay away from anyone that may not be feeling well, and notify your surgeon if you develop symptoms. If you have been in contact with anyone that has tested positive in the last 10 days, please notify your surgeon.    Please read over the following fact sheets that you were given.

## 2021-08-20 NOTE — Telephone Encounter (Signed)
Can you let her know that I have d/c the bactrim and have sent in keflex to take instead

## 2021-08-21 ENCOUNTER — Other Ambulatory Visit: Payer: Self-pay | Admitting: Physician Assistant

## 2021-08-21 ENCOUNTER — Encounter (HOSPITAL_COMMUNITY): Payer: Self-pay

## 2021-08-21 ENCOUNTER — Other Ambulatory Visit: Payer: Self-pay

## 2021-08-21 ENCOUNTER — Encounter (HOSPITAL_COMMUNITY)
Admission: RE | Admit: 2021-08-21 | Discharge: 2021-08-21 | Disposition: A | Discharge status: Home / Self Care | Payer: BC Managed Care – PPO | Source: Ambulatory Visit | Attending: Orthopaedic Surgery | Admitting: Orthopaedic Surgery

## 2021-08-21 VITALS — BP 153/76 | HR 92 | Temp 98.0°F | Resp 17 | Ht 66.0 in | Wt 210.2 lb

## 2021-08-21 DIAGNOSIS — Z794 Long term (current) use of insulin: Secondary | ICD-10-CM | POA: Diagnosis not present

## 2021-08-21 DIAGNOSIS — E119 Type 2 diabetes mellitus without complications: Secondary | ICD-10-CM | POA: Diagnosis not present

## 2021-08-21 DIAGNOSIS — Z01812 Encounter for preprocedural laboratory examination: Secondary | ICD-10-CM | POA: Insufficient documentation

## 2021-08-21 DIAGNOSIS — I517 Cardiomegaly: Secondary | ICD-10-CM | POA: Insufficient documentation

## 2021-08-21 DIAGNOSIS — M1712 Unilateral primary osteoarthritis, left knee: Secondary | ICD-10-CM | POA: Diagnosis not present

## 2021-08-21 DIAGNOSIS — Z01818 Encounter for other preprocedural examination: Secondary | ICD-10-CM

## 2021-08-21 LAB — CBC
HCT: 39.1 % (ref 36.0–46.0)
Hemoglobin: 12.8 g/dL (ref 12.0–15.0)
MCH: 30.2 pg (ref 26.0–34.0)
MCHC: 32.7 g/dL (ref 30.0–36.0)
MCV: 92.2 fL (ref 80.0–100.0)
Platelets: 206 10*3/uL (ref 150–400)
RBC: 4.24 MIL/uL (ref 3.87–5.11)
RDW: 13.2 % (ref 11.5–15.5)
WBC: 8.2 10*3/uL (ref 4.0–10.5)
nRBC: 0 % (ref 0.0–0.2)

## 2021-08-21 LAB — COMPREHENSIVE METABOLIC PANEL
ALT: 19 U/L (ref 0–44)
AST: 20 U/L (ref 15–41)
Albumin: 3.7 g/dL (ref 3.5–5.0)
Alkaline Phosphatase: 75 U/L (ref 38–126)
Anion gap: 6 (ref 5–15)
BUN: 14 mg/dL (ref 8–23)
CO2: 28 mmol/L (ref 22–32)
Calcium: 9.5 mg/dL (ref 8.9–10.3)
Chloride: 103 mmol/L (ref 98–111)
Creatinine, Ser: 0.67 mg/dL (ref 0.44–1.00)
GFR, Estimated: >60 mL/min (ref >60)
Glucose, Bld: 174 mg/dL — ABNORMAL HIGH (ref 70–99)
Potassium: 4 mmol/L (ref 3.5–5.1)
Sodium: 137 mmol/L (ref 135–145)
Total Bilirubin: 0.6 mg/dL (ref 0.3–1.2)
Total Protein: 7.1 g/dL (ref 6.5–8.1)

## 2021-08-21 LAB — GLUCOSE, CAPILLARY: Glucose-Capillary: 192 mg/dL — ABNORMAL HIGH (ref 70–99)

## 2021-08-21 LAB — SURGICAL PCR SCREEN
MRSA, PCR: NEGATIVE
Staphylococcus aureus: NEGATIVE

## 2021-08-21 NOTE — Progress Notes (Addendum)
PCP - Creola Corn Cardiologist - Heide Scales notes in CE  PPM/ICD - Denies  Chest x-ray - 12/13/20 EKG - 12/05/20 Stress Test - 07/19/18 ECHO - 12/26/20 Cardiac Cath - Denies  Sleep Study - Denies  Type II DM CBG at PAT appt 192 Ate breakfast at 0700 grits and sausage/egg/cz biscuit Fasting Blood Sugar - 111  Checks Blood Sugar multiple times a day with libre  Aspirin Instructions: Requested patient to call Dr. Warren Danes office to find out stop date  ERAS Protcol -Yes PRE-SURGERY G2- given  Anesthesia review: Yes cardiac history  Patient denies shortness of breath, fever, cough and chest pain at PAT appointment   All instructions explained to the patient, with a verbal understanding of the material. Patient agrees to go over the instructions while at home for a better understanding.  The opportunity to ask questions was provided.  Updated arrival time to 34am

## 2021-08-22 NOTE — Progress Notes (Signed)
Anesthesia Chart Review:  Patient evaluated by cardiologist Dr. Beverely Pace at [facility] in 2020 for atypical chest pain.  Nuclear stress was nonischemic, echo showed normal LV function, no significant valvular abnormalities.  IDDM 2, last A1c 7.5 on 07/25/2021.  Preop labs reviewed, unremarkable.  EKG 12/05/2020 (Care Everywhere): Sinus rhythm.  Possible LAE.  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  No significant change from prior.  TTE 12/26/2020 (Care Everywhere): SUMMARY  Left ventricular systolic function is low normal.  There is mild concentric left ventricular hypertrophy.  LV ejection fraction = 50-55%.  There is no significant valvular stenosis or regurgitation.  There is trivial pericardial effusion.  There is no comparison study available.   Nuclear stress 07/19/2018 (Care Everywhere): Summary   Both stress and rest images demonstrate normal uptake in all walls.   The calculated EF is 64%.   There is no evidence of inducible ischemia on this study.   Signatures   TTE 07/19/2018 (Care Everywhere): Findings  Mitral Valve  Structurally normal mitral valve with good mobility and no significant  regurgitation.  Aortic Valve  The aortic valve appears to be trileaflet.  Structurally normal aortic valve with good leaflet mobility, and no  regurgitation.  Tricuspid Valve  Tricuspid valve is structurally normal.  No significant tricuspid regurgitation.  Pulmonic Valve  Pulmonic valve is structurally normal.  No Doppler evidence of pulmonic stenosis or insufficiency.  Left Atrium  Normal size left atrium.  Left Ventricle  Ejection fraction is visually estimated at 55-60%.  Mild concentric left ventricular hypertrophy.  Normal left ventricular systolic function with no appreciable segmental  abnormality.  Diastolic function appears normal.  Right Atrium  Normal right atrium.  Right Ventricle  Normal right ventricular size and function.  RVSP cannot be estimated,  insufficient tricuspid regurgitation.  Pericardial Effusion  No evidence of pericardial effusion.  Pleural Effusion  No evidence of pleural effusion.  Miscellaneous  The aortic root diameter is within normal limits.  Ascending aorta is within normal limits.  The IVC is normal.     Zannie Cove [facility] Short Stay Center/Anesthesiology Phone [phone] 08/22/2021 10:23 AM

## 2021-08-22 NOTE — Anesthesia Preprocedure Evaluation (Addendum)
Anesthesia Evaluation  Patient identified by MRN, date of birth, ID band Patient awake    Reviewed: Allergy & Precautions, NPO status , Patient's Chart, lab work & pertinent test results  Airway Mallampati: II  TM Distance: >3 FB Neck ROM: Full    Dental  (+) Dental Advisory Given   Pulmonary neg pulmonary ROS,    breath sounds clear to auscultation       Cardiovascular hypertension, Pt. on medications  Rhythm:Regular Rate:Normal     Neuro/Psych negative neurological ROS     GI/Hepatic negative GI ROS, Neg liver ROS,   Endo/Other  diabetes, Type 2, Oral Hypoglycemic Agents, Insulin Dependent  Renal/GU negative Renal ROS     Musculoskeletal  (+) Arthritis ,   Abdominal   Peds  Hematology negative hematology ROS (+)   Anesthesia Other Findings   Reproductive/Obstetrics                            Lab Results  Component Value Date   WBC 8.2 08/21/2021   HGB 12.8 08/21/2021   HCT 39.1 08/21/2021   MCV 92.2 08/21/2021   PLT 206 08/21/2021   Lab Results  Component Value Date   CREATININE 0.67 08/21/2021   BUN 14 08/21/2021   NA 137 08/21/2021   K 4.0 08/21/2021   CL 103 08/21/2021   CO2 28 08/21/2021    Anesthesia Physical Anesthesia Plan  ASA: 2  Anesthesia Plan: Spinal   Post-op Pain Management: Regional block* and Tylenol PO (pre-op)*   Induction:   PONV Risk Score and Plan: 2 and Propofol infusion, Ondansetron and Treatment may vary due to age or medical condition  Airway Management Planned: Natural Airway and Simple Face Mask  Additional Equipment: None  Intra-op Plan:   Post-operative Plan:   Informed Consent: I have reviewed the patients History and Physical, chart, labs and discussed the procedure including the risks, benefits and alternatives for the proposed anesthesia with the patient or authorized representative who has indicated his/her understanding and  acceptance.     Dental advisory given  Plan Discussed with: CRNA  Anesthesia Plan Comments: (PAT note by Antionette Poles, PA-C: Patient evaluated by cardiologist Dr. Beverely Pace at [facility] in 2020 for atypical chest pain.  Nuclear stress was nonischemic, echo showed normal LV function, no significant valvular abnormalities.  IDDM 2, last A1c 7.5 on 07/25/2021.  Preop labs reviewed, unremarkable.  EKG 12/05/2020 (Care Everywhere): Sinus rhythm.  Possible LAE.  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  No significant change from prior.  TTE 12/26/2020 (Care Everywhere): SUMMARY  Left ventricular systolic function is low normal.  There is mild concentric left ventricular hypertrophy.  LV ejection fraction = 50-55%.  There is no significant valvular stenosis or regurgitation.  There is trivial pericardial effusion.  There is no comparison study available.   Nuclear stress 07/19/2018 (Care Everywhere): Summary  Both stress and rest images demonstrate normal uptake in all walls.  The calculated EF is 64%.  There is no evidence of inducible ischemia on this study.  Signatures   TTE 07/19/2018 (Care Everywhere): Findings  Mitral Valve  Structurally normal mitral valve with good mobility and no significant  regurgitation.  Aortic Valve  The aortic valve appears to be trileaflet.  Structurally normal aortic valve with good leaflet mobility, and no  regurgitation.  Tricuspid Valve  Tricuspid valve is structurally normal.  No significant tricuspid regurgitation.  Pulmonic Valve  Pulmonic valve is structurally  normal.  No Doppler evidence of pulmonic stenosis or insufficiency.  Left Atrium  Normal size left atrium.  Left Ventricle  Ejection fraction is visually estimated at 55-60%.  Mild concentric left ventricularhypertrophy.  Normal left ventricular systolic function with no appreciable segmental  abnormality.  Diastolic function appears normal.  Right Atrium  Normal right  atrium.  Right Ventricle  Normal right ventricular size and function.  RVSP cannot be estimated, insufficient tricuspid regurgitation.  Pericardial Effusion  No evidence of pericardial effusion.  Pleural Effusion  No evidence of pleural effusion.  Miscellaneous  The aortic root diameter is within normal limits.  Ascending aorta is within normal limits.  The IVC is normal.   )       Anesthesia Quick Evaluation

## 2021-08-23 MED ORDER — TRANEXAMIC ACID 1000 MG/10ML IV SOLN
2000.0000 mg | INTRAVENOUS | Status: DC
  Filled 2021-08-23: qty 20

## 2021-08-26 ENCOUNTER — Ambulatory Visit (HOSPITAL_COMMUNITY): Payer: BC Managed Care – PPO | Admitting: Physician Assistant

## 2021-08-26 ENCOUNTER — Other Ambulatory Visit: Payer: Self-pay

## 2021-08-26 ENCOUNTER — Other Ambulatory Visit: Payer: Self-pay | Admitting: Physician Assistant

## 2021-08-26 ENCOUNTER — Encounter (HOSPITAL_COMMUNITY)
Admission: RE | Disposition: A | Discharge status: Home Health | Payer: Self-pay | Source: Home / Self Care | Attending: Orthopaedic Surgery

## 2021-08-26 ENCOUNTER — Encounter (HOSPITAL_COMMUNITY): Payer: Self-pay | Admitting: Orthopaedic Surgery

## 2021-08-26 ENCOUNTER — Observation Stay (HOSPITAL_COMMUNITY)
Admission: RE | Admit: 2021-08-26 | Discharge: 2021-08-28 | Disposition: A | Discharge status: Home Health | Payer: BC Managed Care – PPO | Attending: Orthopaedic Surgery | Admitting: Orthopaedic Surgery

## 2021-08-26 ENCOUNTER — Ambulatory Visit (HOSPITAL_COMMUNITY): Payer: BC Managed Care – PPO | Admitting: Certified Registered Nurse Anesthetist

## 2021-08-26 ENCOUNTER — Observation Stay (HOSPITAL_COMMUNITY): Payer: BC Managed Care – PPO

## 2021-08-26 DIAGNOSIS — Z96651 Presence of right artificial knee joint: Secondary | ICD-10-CM | POA: Diagnosis not present

## 2021-08-26 DIAGNOSIS — I10 Essential (primary) hypertension: Secondary | ICD-10-CM | POA: Insufficient documentation

## 2021-08-26 DIAGNOSIS — Z79899 Other long term (current) drug therapy: Secondary | ICD-10-CM | POA: Insufficient documentation

## 2021-08-26 DIAGNOSIS — Z794 Long term (current) use of insulin: Secondary | ICD-10-CM | POA: Insufficient documentation

## 2021-08-26 DIAGNOSIS — M1712 Unilateral primary osteoarthritis, left knee: Principal | ICD-10-CM | POA: Diagnosis present

## 2021-08-26 DIAGNOSIS — E119 Type 2 diabetes mellitus without complications: Secondary | ICD-10-CM | POA: Insufficient documentation

## 2021-08-26 DIAGNOSIS — Z96652 Presence of left artificial knee joint: Secondary | ICD-10-CM | POA: Diagnosis not present

## 2021-08-26 DIAGNOSIS — Z7982 Long term (current) use of aspirin: Secondary | ICD-10-CM | POA: Diagnosis not present

## 2021-08-26 DIAGNOSIS — Z86718 Personal history of other venous thrombosis and embolism: Secondary | ICD-10-CM | POA: Insufficient documentation

## 2021-08-26 DIAGNOSIS — Z471 Aftercare following joint replacement surgery: Secondary | ICD-10-CM | POA: Diagnosis not present

## 2021-08-26 DIAGNOSIS — G8918 Other acute postprocedural pain: Secondary | ICD-10-CM | POA: Diagnosis not present

## 2021-08-26 HISTORY — PX: TOTAL KNEE ARTHROPLASTY: SHX125

## 2021-08-26 LAB — GLUCOSE, CAPILLARY
Glucose-Capillary: 128 mg/dL — ABNORMAL HIGH (ref 70–99)
Glucose-Capillary: 126 mg/dL — ABNORMAL HIGH (ref 70–99)
Glucose-Capillary: 127 mg/dL — ABNORMAL HIGH (ref 70–99)
Glucose-Capillary: 216 mg/dL — ABNORMAL HIGH (ref 70–99)
Glucose-Capillary: 229 mg/dL — ABNORMAL HIGH (ref 70–99)

## 2021-08-26 SURGERY — ARTHROPLASTY, KNEE, TOTAL
Anesthesia: Monitor Anesthesia Care | Site: Knee | Laterality: Left

## 2021-08-26 MED ORDER — LORAZEPAM 0.5 MG PO TABS
0.5000 mg | ORAL_TABLET | Freq: Four times a day (QID) | ORAL | Status: DC | PRN
  Administered 2021-08-27 (×2): 0.5 mg via ORAL
  Filled 2021-08-26 (×2): qty 1

## 2021-08-26 MED ORDER — AMLODIPINE BESYLATE 10 MG PO TABS
10.0000 mg | ORAL_TABLET | Freq: Every day | ORAL | Status: DC
  Administered 2021-08-26 – 2021-08-27 (×2): 10 mg via ORAL
  Filled 2021-08-26 (×2): qty 1

## 2021-08-26 MED ORDER — CEFAZOLIN SODIUM-DEXTROSE 2-4 GM/100ML-% IV SOLN
2.0000 g | INTRAVENOUS | Status: AC
Start: 2021-08-26 — End: 2021-08-26
  Administered 2021-08-26: 2 g via INTRAVENOUS
  Filled 2021-08-26: qty 100

## 2021-08-26 MED ORDER — OXYCODONE HCL ER 10 MG PO T12A
10.0000 mg | EXTENDED_RELEASE_TABLET | Freq: Two times a day (BID) | ORAL | Status: DC
  Administered 2021-08-26 – 2021-08-28 (×5): 10 mg via ORAL
  Filled 2021-08-26 (×5): qty 1

## 2021-08-26 MED ORDER — FERROUS SULFATE 325 (65 FE) MG PO TABS
325.0000 mg | ORAL_TABLET | Freq: Three times a day (TID) | ORAL | Status: DC
  Administered 2021-08-26 – 2021-08-28 (×5): 325 mg via ORAL
  Filled 2021-08-26 (×5): qty 1

## 2021-08-26 MED ORDER — ACETAMINOPHEN 10 MG/ML IV SOLN
INTRAVENOUS | Status: AC
  Filled 2021-08-26: qty 100

## 2021-08-26 MED ORDER — INSULIN ASPART 100 UNIT/ML IJ SOLN
0.0000 [IU] | Freq: Three times a day (TID) | INTRAMUSCULAR | Status: DC
  Administered 2021-08-26 – 2021-08-27 (×3): 7 [IU] via SUBCUTANEOUS
  Administered 2021-08-27: 4 [IU] via SUBCUTANEOUS

## 2021-08-26 MED ORDER — DEXTROSE 50 % IV SOLN: 25.0000 mL | INTRAVENOUS | Status: AC

## 2021-08-26 MED ORDER — ONDANSETRON HCL 4 MG/2ML IJ SOLN
4.0000 mg | Freq: Four times a day (QID) | INTRAMUSCULAR | Status: DC | PRN
  Administered 2021-08-26: 4 mg via INTRAVENOUS

## 2021-08-26 MED ORDER — DEXAMETHASONE SODIUM PHOSPHATE 10 MG/ML IJ SOLN
10.0000 mg | Freq: Once | INTRAMUSCULAR | Status: AC
  Administered 2021-08-27: 10 mg via INTRAVENOUS
  Filled 2021-08-26: qty 1

## 2021-08-26 MED ORDER — TRANEXAMIC ACID-NACL 1000-0.7 MG/100ML-% IV SOLN
1000.0000 mg | Freq: Once | INTRAVENOUS | Status: AC
Start: 2021-08-26 — End: 2021-08-26
  Administered 2021-08-26: 1000 mg via INTRAVENOUS
  Filled 2021-08-26: qty 100

## 2021-08-26 MED ORDER — TRANEXAMIC ACID 1000 MG/10ML IV SOLN
INTRAVENOUS | Status: DC | PRN
  Administered 2021-08-26: 2000 mg via TOPICAL

## 2021-08-26 MED ORDER — ONDANSETRON HCL 4 MG/2ML IJ SOLN
INTRAMUSCULAR | Status: AC
  Filled 2021-08-26: qty 2

## 2021-08-26 MED ORDER — ASPIRIN 81 MG PO TBEC
81.0000 mg | DELAYED_RELEASE_TABLET | Freq: Two times a day (BID) | ORAL | 0 refills | Status: AC
Start: 2021-08-26 — End: 2022-08-26

## 2021-08-26 MED ORDER — DEXTROSE 50 % IV SOLN
INTRAVENOUS | Status: AC
  Administered 2021-08-26: 25 mL via INTRAVENOUS
  Filled 2021-08-26: qty 50

## 2021-08-26 MED ORDER — RIVAROXABAN 10 MG PO TABS
10.0000 mg | ORAL_TABLET | Freq: Every day | ORAL | Status: DC
Start: 2021-08-27 — End: 2021-08-28
  Administered 2021-08-27 – 2021-08-28 (×2): 10 mg via ORAL
  Filled 2021-08-26 (×2): qty 1

## 2021-08-26 MED ORDER — DEXTROSE 50 % IV SOLN
INTRAVENOUS | Status: DC | PRN
  Administered 2021-08-26: 5 mL via INTRAVENOUS
  Administered 2021-08-26: 7 mL via INTRAVENOUS
  Administered 2021-08-26: 3 mL via INTRAVENOUS
  Administered 2021-08-26 (×3): 5 mL via INTRAVENOUS

## 2021-08-26 MED ORDER — INSULIN GLARGINE-YFGN 100 UNIT/ML ~~LOC~~ SOLN
32.0000 [IU] | Freq: Two times a day (BID) | SUBCUTANEOUS | Status: DC
Start: 2021-08-26 — End: 2021-08-28
  Administered 2021-08-26 – 2021-08-28 (×4): 32 [IU] via SUBCUTANEOUS
  Filled 2021-08-26 (×5): qty 0.32

## 2021-08-26 MED ORDER — CLONIDINE HCL (ANALGESIA) 100 MCG/ML EP SOLN
EPIDURAL | Status: DC | PRN
  Administered 2021-08-26: 50 ug

## 2021-08-26 MED ORDER — FENTANYL CITRATE (PF) 100 MCG/2ML IJ SOLN: 50.0000 ug | Freq: Once | INTRAMUSCULAR | Status: AC

## 2021-08-26 MED ORDER — ACETAMINOPHEN 325 MG PO TABS
325.0000 mg | ORAL_TABLET | Freq: Four times a day (QID) | ORAL | Status: DC | PRN
  Administered 2021-08-27 – 2021-08-28 (×2): 650 mg via ORAL
  Filled 2021-08-26: qty 2

## 2021-08-26 MED ORDER — HYDROMORPHONE HCL 1 MG/ML IJ SOLN
0.5000 mg | INTRAMUSCULAR | Status: DC | PRN
  Administered 2021-08-26 (×2): 1 mg via INTRAVENOUS
  Filled 2021-08-26 (×2): qty 1

## 2021-08-26 MED ORDER — FENTANYL CITRATE (PF) 100 MCG/2ML IJ SOLN
25.0000 ug | INTRAMUSCULAR | Status: DC | PRN
  Administered 2021-08-26 (×2): 50 ug via INTRAVENOUS

## 2021-08-26 MED ORDER — TRANEXAMIC ACID-NACL 1000-0.7 MG/100ML-% IV SOLN
1000.0000 mg | INTRAVENOUS | Status: AC
Start: 2021-08-26 — End: 2021-08-26
  Administered 2021-08-26: 1000 mg via INTRAVENOUS
  Filled 2021-08-26: qty 100

## 2021-08-26 MED ORDER — LACTATED RINGERS IV SOLN: INTRAVENOUS | Status: DC

## 2021-08-26 MED ORDER — PROPOFOL 10 MG/ML IV BOLUS
INTRAVENOUS | Status: AC
  Filled 2021-08-26: qty 20

## 2021-08-26 MED ORDER — DEXTROSE 50 % IV SOLN
INTRAVENOUS | Status: AC
  Filled 2021-08-26: qty 50

## 2021-08-26 MED ORDER — POVIDONE-IODINE 10 % EX SWAB: 2.0000 | Freq: Once | CUTANEOUS | Status: DC

## 2021-08-26 MED ORDER — OXYCODONE HCL 5 MG PO TABS
10.0000 mg | ORAL_TABLET | ORAL | Status: DC | PRN
  Administered 2021-08-27: 15 mg via ORAL
  Administered 2021-08-28: 10 mg via ORAL
  Administered 2021-08-28: 15 mg via ORAL
  Filled 2021-08-26 (×3): qty 3

## 2021-08-26 MED ORDER — ACETAMINOPHEN 500 MG PO TABS
1000.0000 mg | ORAL_TABLET | Freq: Once | ORAL | Status: DC
  Filled 2021-08-26: qty 2

## 2021-08-26 MED ORDER — CHLORHEXIDINE GLUCONATE 0.12 % MT SOLN
OROMUCOSAL | Status: AC
  Administered 2021-08-26: 15 mL
  Filled 2021-08-26: qty 15

## 2021-08-26 MED ORDER — MIDAZOLAM HCL 2 MG/2ML IJ SOLN: 1.0000 mg | Freq: Once | INTRAMUSCULAR | Status: AC

## 2021-08-26 MED ORDER — INSULIN ASPART 100 UNIT/ML IJ SOLN
32.0000 [IU] | Freq: Two times a day (BID) | INTRAMUSCULAR | Status: DC
  Administered 2021-08-27 (×2): 32 [IU] via SUBCUTANEOUS

## 2021-08-26 MED ORDER — ACETAMINOPHEN 500 MG PO TABS
1000.0000 mg | ORAL_TABLET | Freq: Four times a day (QID) | ORAL | Status: AC
  Administered 2021-08-26 – 2021-08-27 (×3): 1000 mg via ORAL
  Filled 2021-08-26 (×4): qty 2

## 2021-08-26 MED ORDER — PROPOFOL 10 MG/ML IV BOLUS
INTRAVENOUS | Status: DC | PRN
  Administered 2021-08-26 (×2): 20 mg via INTRAVENOUS

## 2021-08-26 MED ORDER — METOCLOPRAMIDE HCL 5 MG/ML IJ SOLN
5.0000 mg | Freq: Three times a day (TID) | INTRAMUSCULAR | Status: DC | PRN
  Administered 2021-08-26: 10 mg via INTRAVENOUS
  Filled 2021-08-26: qty 2

## 2021-08-26 MED ORDER — SODIUM CHLORIDE 0.9 % IV SOLN: INTRAVENOUS | Status: DC

## 2021-08-26 MED ORDER — DOCUSATE SODIUM 100 MG PO CAPS
100.0000 mg | ORAL_CAPSULE | Freq: Two times a day (BID) | ORAL | Status: DC
Start: 2021-08-26 — End: 2021-08-28
  Administered 2021-08-26 – 2021-08-28 (×4): 100 mg via ORAL
  Filled 2021-08-26 (×4): qty 1

## 2021-08-26 MED ORDER — OXYCODONE HCL 5 MG PO TABS
ORAL_TABLET | ORAL | Status: AC
  Filled 2021-08-26: qty 2

## 2021-08-26 MED ORDER — PROPOFOL 500 MG/50ML IV EMUL
INTRAVENOUS | Status: DC | PRN
  Administered 2021-08-26: 70 ug/kg/min via INTRAVENOUS

## 2021-08-26 MED ORDER — BUPIVACAINE-MELOXICAM ER 400-12 MG/14ML IJ SOLN
INTRAMUSCULAR | Status: AC
Start: 2021-08-26 — End: ?
  Filled 2021-08-26: qty 1

## 2021-08-26 MED ORDER — METOCLOPRAMIDE HCL 5 MG PO TABS: 5.0000 mg | ORAL_TABLET | Freq: Three times a day (TID) | ORAL | Status: DC | PRN

## 2021-08-26 MED ORDER — RIVAROXABAN 10 MG PO TABS: 10.0000 mg | ORAL_TABLET | Freq: Every day | ORAL | 0 refills | Status: AC

## 2021-08-26 MED ORDER — MENTHOL 3 MG MT LOZG: 1.0000 | LOZENGE | OROMUCOSAL | Status: DC | PRN

## 2021-08-26 MED ORDER — INSULIN ASPART 100 UNIT/ML IJ SOLN: 0.0000 [IU] | Freq: Three times a day (TID) | INTRAMUSCULAR | Status: DC

## 2021-08-26 MED ORDER — INSULIN ASPART 100 UNIT/ML IJ SOLN
0.0000 [IU] | Freq: Every day | INTRAMUSCULAR | Status: DC
  Administered 2021-08-26 – 2021-08-27 (×2): 2 [IU] via SUBCUTANEOUS

## 2021-08-26 MED ORDER — PHENYLEPHRINE HCL-NACL 20-0.9 MG/250ML-% IV SOLN
INTRAVENOUS | Status: DC | PRN
  Administered 2021-08-26: 25 ug/min via INTRAVENOUS

## 2021-08-26 MED ORDER — METHOCARBAMOL 1000 MG/10ML IJ SOLN: 500.0000 mg | Freq: Four times a day (QID) | INTRAVENOUS | Status: DC | PRN

## 2021-08-26 MED ORDER — AMISULPRIDE (ANTIEMETIC) 5 MG/2ML IV SOLN
INTRAVENOUS | Status: AC
  Filled 2021-08-26: qty 4

## 2021-08-26 MED ORDER — SODIUM CHLORIDE 0.9 % IR SOLN
Status: DC | PRN
  Administered 2021-08-26: 1000 mL

## 2021-08-26 MED ORDER — OXYCODONE HCL 5 MG PO TABS
5.0000 mg | ORAL_TABLET | ORAL | Status: DC | PRN
  Administered 2021-08-26 – 2021-08-27 (×4): 10 mg via ORAL
  Filled 2021-08-26 (×3): qty 2

## 2021-08-26 MED ORDER — 0.9 % SODIUM CHLORIDE (POUR BTL) OPTIME
TOPICAL | Status: DC | PRN
  Administered 2021-08-26: 1000 mL

## 2021-08-26 MED ORDER — PRONTOSAN WOUND IRRIGATION OPTIME
TOPICAL | Status: DC | PRN
  Administered 2021-08-26 (×2): 1 via TOPICAL

## 2021-08-26 MED ORDER — IRBESARTAN 300 MG PO TABS
300.0000 mg | ORAL_TABLET | Freq: Every day | ORAL | Status: DC
  Administered 2021-08-27 – 2021-08-28 (×2): 300 mg via ORAL
  Filled 2021-08-26 (×3): qty 1

## 2021-08-26 MED ORDER — AMISULPRIDE (ANTIEMETIC) 5 MG/2ML IV SOLN
10.0000 mg | Freq: Once | INTRAVENOUS | Status: AC | PRN
  Administered 2021-08-26: 10 mg via INTRAVENOUS

## 2021-08-26 MED ORDER — CEFAZOLIN SODIUM-DEXTROSE 2-4 GM/100ML-% IV SOLN
2.0000 g | Freq: Four times a day (QID) | INTRAVENOUS | Status: AC
  Administered 2021-08-26 (×2): 2 g via INTRAVENOUS
  Filled 2021-08-26 (×2): qty 100

## 2021-08-26 MED ORDER — MIDAZOLAM HCL 2 MG/2ML IJ SOLN
INTRAMUSCULAR | Status: AC
  Administered 2021-08-26: 1 mg via INTRAVENOUS
  Filled 2021-08-26: qty 2

## 2021-08-26 MED ORDER — VANCOMYCIN HCL 1000 MG IV SOLR
INTRAVENOUS | Status: DC | PRN
  Administered 2021-08-26: 1000 mg

## 2021-08-26 MED ORDER — FENTANYL CITRATE (PF) 100 MCG/2ML IJ SOLN
INTRAMUSCULAR | Status: AC
  Administered 2021-08-26: 50 ug via INTRAVENOUS
  Filled 2021-08-26: qty 2

## 2021-08-26 MED ORDER — KETOROLAC TROMETHAMINE 15 MG/ML IJ SOLN
7.5000 mg | Freq: Four times a day (QID) | INTRAMUSCULAR | Status: AC
  Administered 2021-08-26 – 2021-08-27 (×4): 7.5 mg via INTRAVENOUS
  Filled 2021-08-26 (×4): qty 1

## 2021-08-26 MED ORDER — PHENOL 1.4 % MT LIQD: 1.0000 | OROMUCOSAL | Status: DC | PRN

## 2021-08-26 MED ORDER — BUPIVACAINE-MELOXICAM ER 400-12 MG/14ML IJ SOLN
INTRAMUSCULAR | Status: DC | PRN
  Administered 2021-08-26: 400 mg

## 2021-08-26 MED ORDER — ACETAMINOPHEN 10 MG/ML IV SOLN: 1000.0000 mg | Freq: Once | INTRAVENOUS | Status: DC | PRN

## 2021-08-26 MED ORDER — ONDANSETRON HCL 4 MG PO TABS
4.0000 mg | ORAL_TABLET | Freq: Four times a day (QID) | ORAL | Status: DC | PRN
  Administered 2021-08-27: 4 mg via ORAL
  Filled 2021-08-26: qty 1

## 2021-08-26 MED ORDER — HYDROXYZINE HCL 50 MG/ML IM SOLN
50.0000 mg | Freq: Four times a day (QID) | INTRAMUSCULAR | Status: DC | PRN
  Administered 2021-08-26: 50 mg via INTRAMUSCULAR
  Filled 2021-08-26: qty 1

## 2021-08-26 MED ORDER — BUPIVACAINE IN DEXTROSE 0.75-8.25 % IT SOLN
INTRATHECAL | Status: DC | PRN
  Administered 2021-08-26: 1.6 mL via INTRATHECAL

## 2021-08-26 MED ORDER — FENTANYL CITRATE (PF) 100 MCG/2ML IJ SOLN
INTRAMUSCULAR | Status: AC
  Filled 2021-08-26: qty 2

## 2021-08-26 MED ORDER — METHOCARBAMOL 500 MG PO TABS
500.0000 mg | ORAL_TABLET | Freq: Four times a day (QID) | ORAL | Status: DC | PRN
  Administered 2021-08-26 – 2021-08-28 (×6): 500 mg via ORAL
  Filled 2021-08-26 (×6): qty 1

## 2021-08-26 MED ORDER — ROPIVACAINE HCL 5 MG/ML IJ SOLN
INTRAMUSCULAR | Status: DC | PRN
  Administered 2021-08-26: 20 mL via PERINEURAL

## 2021-08-26 MED ORDER — INSULIN LISPRO 100 UNIT/ML (KWIKPEN): 32.0000 [IU] | PEN_INJECTOR | Freq: Two times a day (BID) | SUBCUTANEOUS | Status: DC

## 2021-08-26 MED ORDER — VANCOMYCIN HCL 1000 MG IV SOLR
INTRAVENOUS | Status: AC
Start: 2021-08-26 — End: ?
  Filled 2021-08-26: qty 20

## 2021-08-26 SURGICAL SUPPLY — 82 items
ALCOHOL 70% 16 OZ (MISCELLANEOUS) ×2 IMPLANT
BAG COUNTER SPONGE SURGICOUNT (BAG) IMPLANT
BAG DECANTER FOR FLEXI CONT (MISCELLANEOUS) ×2 IMPLANT
BANDAGE ESMARK 6X9 LF (GAUZE/BANDAGES/DRESSINGS) IMPLANT
BASEPLATE TIBIAL SZ 4 LT (Knees) ×1 IMPLANT
BLADE SAG 18X100X1.27 (BLADE) ×2 IMPLANT
BLADE SAW SAG 90X13X1.27 (BLADE) ×1 IMPLANT
BNDG ESMARK 6X9 LF (GAUZE/BANDAGES/DRESSINGS)
BOWL SMART MIX CTS (DISPOSABLE) ×2 IMPLANT
CEMENT BONE REFOBACIN R1X40 US (Cement) ×2 IMPLANT
CLSR STERI-STRIP ANTIMIC 1/2X4 (GAUZE/BANDAGES/DRESSINGS) ×2 IMPLANT
COMP FEMORAL POST S5N LT (Stem) ×2 IMPLANT
COMPONENT FEMORAL POST S5N LT (Stem) IMPLANT
COOLER ICEMAN CLASSIC (MISCELLANEOUS) ×2 IMPLANT
COVER SURGICAL LIGHT HANDLE (MISCELLANEOUS) ×2 IMPLANT
CUFF TOURN SGL QUICK 34 (TOURNIQUET CUFF) ×2
CUFF TOURN SGL QUICK 42 (TOURNIQUET CUFF) IMPLANT
CUFF TRNQT CYL 34X4.125X (TOURNIQUET CUFF) ×1 IMPLANT
DERMABOND ADVANCED (GAUZE/BANDAGES/DRESSINGS) ×2
DERMABOND ADVANCED .7 DNX12 (GAUZE/BANDAGES/DRESSINGS) ×1 IMPLANT
DRAPE EXTREMITY T 121X128X90 (DISPOSABLE) ×2 IMPLANT
DRAPE HALF SHEET 40X57 (DRAPES) ×2 IMPLANT
DRAPE INCISE IOBAN 66X45 STRL (DRAPES) ×2 IMPLANT
DRAPE ORTHO SPLIT 77X108 STRL (DRAPES) ×4
DRAPE POUCH INSTRU U-SHP 10X18 (DRAPES) ×2 IMPLANT
DRAPE SURG ORHT 6 SPLT 77X108 (DRAPES) ×2 IMPLANT
DRAPE U-SHAPE 47X51 STRL (DRAPES) ×2 IMPLANT
DRSG AQUACEL AG ADV 3.5X10 (GAUZE/BANDAGES/DRESSINGS) ×2 IMPLANT
DURAPREP 26ML APPLICATOR (WOUND CARE) ×6 IMPLANT
ELECT CAUTERY BLADE 6.4 (BLADE) ×2 IMPLANT
ELECT REM PT RETURN 9FT ADLT (ELECTROSURGICAL) ×2
ELECTRODE REM PT RTRN 9FT ADLT (ELECTROSURGICAL) ×1 IMPLANT
GLOVE BIOGEL PI IND STRL 7.0 (GLOVE) ×2 IMPLANT
GLOVE BIOGEL PI IND STRL 7.5 (GLOVE) ×5 IMPLANT
GLOVE BIOGEL PI INDICATOR 7.0 (GLOVE) ×4
GLOVE BIOGEL PI INDICATOR 7.5 (GLOVE) ×10
GLOVE ECLIPSE 7.0 STRL STRAW (GLOVE) ×6 IMPLANT
GLOVE SKINSENSE NS SZ7.5 (GLOVE) ×6
GLOVE SKINSENSE STRL SZ7.5 (GLOVE) ×3 IMPLANT
GLOVE SURG SYN 7.5  E (GLOVE) ×4
GLOVE SURG SYN 7.5 E (GLOVE) ×2 IMPLANT
GLOVE SURG SYN 7.5 PF PI (GLOVE) ×2 IMPLANT
GLOVE SURG UNDER LTX SZ7.5 (GLOVE) ×4 IMPLANT
GLOVE SURG UNDER POLY LF SZ7 (GLOVE) ×4 IMPLANT
GOWN STRL REIN XL XLG (GOWN DISPOSABLE) ×2 IMPLANT
GOWN STRL REUS W/ TWL LRG LVL3 (GOWN DISPOSABLE) ×1 IMPLANT
GOWN STRL REUS W/TWL LRG LVL3 (GOWN DISPOSABLE) ×2
HANDPIECE INTERPULSE COAX TIP (DISPOSABLE) ×2
HOOD PEEL AWAY FLYTE STAYCOOL (MISCELLANEOUS) ×5 IMPLANT
INSERT TIB PS SZ3-4 12 (Miscellaneous) ×1 IMPLANT
KIT BASIN OR (CUSTOM PROCEDURE TRAY) ×2 IMPLANT
KIT TURNOVER KIT B (KITS) ×2 IMPLANT
MANIFOLD NEPTUNE II (INSTRUMENTS) ×2 IMPLANT
MARKER SKIN DUAL TIP RULER LAB (MISCELLANEOUS) ×4 IMPLANT
NDL SPNL 18GX3.5 QUINCKE PK (NEEDLE) ×1 IMPLANT
NEEDLE SPNL 18GX3.5 QUINCKE PK (NEEDLE) ×2 IMPLANT
NS IRRIG 1000ML POUR BTL (IV SOLUTION) ×2 IMPLANT
PACK TOTAL JOINT (CUSTOM PROCEDURE TRAY) ×2 IMPLANT
PAD ARMBOARD 7.5X6 YLW CONV (MISCELLANEOUS) ×4 IMPLANT
PAD COLD SHLDR WRAP-ON (PAD) ×2 IMPLANT
PATELLA RESURF GEN II 32MM (Orthopedic Implant) ×1 IMPLANT
PIN SPEED RIM 45 STL (PIN) IMPLANT
PIN TROCAR 3 INCH (PIN) ×8 IMPLANT
SAW OSC TIP CART 19.5X105X1.3 (SAW) ×1 IMPLANT
SET HNDPC FAN SPRY TIP SCT (DISPOSABLE) ×1 IMPLANT
SOLUTION PRONTOSAN WOUND 350ML (IRRIGATION / IRRIGATOR) ×2 IMPLANT
STAPLER VISISTAT 35W (STAPLE) IMPLANT
SUCTION FRAZIER HANDLE 10FR (MISCELLANEOUS) ×2
SUCTION TUBE FRAZIER 10FR DISP (MISCELLANEOUS) ×1 IMPLANT
SUT ETHILON 2 0 FS 18 (SUTURE) ×1 IMPLANT
SUT MNCRL AB 3-0 PS2 27 (SUTURE) IMPLANT
SUT VIC AB 0 CT1 27 (SUTURE) ×4
SUT VIC AB 0 CT1 27XBRD ANBCTR (SUTURE) ×2 IMPLANT
SUT VIC AB 1 CTX 27 (SUTURE) ×6 IMPLANT
SUT VIC AB 2-0 CT1 27 (SUTURE) ×8
SUT VIC AB 2-0 CT1 TAPERPNT 27 (SUTURE) ×4 IMPLANT
SYR 50ML LL SCALE MARK (SYRINGE) ×3 IMPLANT
TOWEL GREEN STERILE (TOWEL DISPOSABLE) ×2 IMPLANT
TOWEL GREEN STERILE FF (TOWEL DISPOSABLE) ×2 IMPLANT
TRAY CATH 16FR W/PLASTIC CATH (SET/KITS/TRAYS/PACK) IMPLANT
UNDERPAD 30X36 HEAVY ABSORB (UNDERPADS AND DIAPERS) ×2 IMPLANT
YANKAUER SUCT BULB TIP NO VENT (SUCTIONS) ×3 IMPLANT

## 2021-08-26 NOTE — H&P (Signed)
PREOPERATIVE H&P  Chief Complaint: left knee degenerative joint disease  HPI: Kristin Anthony is a 71 y.o. female who presents for surgical treatment of left knee degenerative joint disease.  She denies any changes in medical history.  Past Medical History:  Diagnosis Date   Arthritis    Depression    Diabetes mellitus    DVT (deep venous thrombosis) (HCC)    High cholesterol    Hypertension    Past Surgical History:  Procedure Laterality Date   ABDOMINAL HYSTERECTOMY     partial   APPENDECTOMY     BACK SURGERY     KNEE SURGERY     TOTAL KNEE ARTHROPLASTY Right 07/13/2017   TOTAL KNEE ARTHROPLASTY Right 07/13/2017   Procedure: RIGHT TOTAL KNEE ARTHROPLASTY;  Surgeon: Tarry Kos, MD;  Location: [facility] OR;  Service: Orthopedics;  Laterality: Right;   Social History   Socioeconomic History   Marital status: Married    Spouse name: Freddie   Number of children: 1   Years of education: Not on file   Highest education level: Not on file  Occupational History   Not on file  Tobacco Use   Smoking status: Never   Smokeless tobacco: Never  Vaping Use   Vaping Use: Never used  Substance and Sexual Activity   Alcohol use: No   Drug use: No   Sexual activity: Not Currently  Other Topics Concern   Not on file  Social History Narrative   Not on file   Social Determinants of Health   Financial Resource Strain: Not on file  Food Insecurity: Not on file  Transportation Needs: Not on file  Physical Activity: Not on file  Stress: Not on file  Social Connections: Not on file   Family History  Problem Relation Age of Onset   Heart attack Mother    Diabetes Father    Hypertension Father    Hypertension Sister    Diabetes Sister    Hypertension Brother    Diabetes Brother    Hyperlipidemia Neg Hx    Sudden death Neg Hx    Allergies  Allergen Reactions   Codeine Nausea And Vomiting   Prior to Admission medications   Medication Sig Start Date End Date Taking?  Authorizing Provider  amLODipine (NORVASC) 10 MG tablet Take 10 mg by mouth at bedtime.   Yes [provider]  aspirin EC 81 MG tablet Take 1 tablet (81 mg total) by mouth 2 (two) times daily. To be taken after surgery to prevent blood clots 08/18/21 08/18/22 Yes Cristie Hem, PA-C  atorvastatin (LIPITOR) 40 MG tablet Take 40 mg by mouth at bedtime.   Yes [provider]  celecoxib (CELEBREX) 200 MG capsule Take 1 capsule (200 mg total) by mouth 2 (two) times daily. 06/12/21  Yes Tarry Kos, MD  cholecalciferol (VITAMIN D3) 25 MCG (1000 UNIT) tablet Take 1,000 Units by mouth every other day.   Yes [provider]  docusate sodium (COLACE) 100 MG capsule Take 1 capsule (100 mg total) by mouth daily as needed. 08/18/21 08/18/22 Yes Cristie Hem, PA-C  hydrALAZINE (APRESOLINE) 25 MG tablet Take 25 mg by mouth daily. 03/25/21  Yes [provider]  HYDROcodone-acetaminophen (NORCO) 5-325 MG tablet Take 1 tablet by mouth 2 (two) times daily as needed. 07/26/21  Yes Cristie Hem, PA-C  insulin lispro (HUMALOG KWIKPEN) 100 UNIT/ML KiwkPen Inject 32 Units into the skin 2 (two) times daily.   Yes  [provider]  LORazepam (ATIVAN) 0.5 MG tablet Take 0.5 mg by mouth in the morning and at bedtime. 07/25/21  Yes [provider]  meloxicam (MOBIC) 7.5 MG tablet Take 7.5 mg by mouth daily. 07/25/21  Yes [provider]  methocarbamol (ROBAXIN) 500 MG tablet Take 1 tablet (500 mg total) by mouth 2 (two) times daily as needed. 08/18/21  Yes Cristie Hem, PA-C  montelukast (SINGULAIR) 10 MG tablet Take 10 mg by mouth at bedtime.   Yes [provider]  olmesartan (BENICAR) 40 MG tablet Take 40 mg by mouth daily.   Yes [provider]  ondansetron (ZOFRAN) 4 MG tablet Take 1 tablet (4 mg total) by mouth every 8 (eight) hours as needed for nausea or vomiting. 08/18/21  Yes Cristie Hem, PA-C  pregabalin (LYRICA) 50 MG capsule Take 50  mg by mouth in the morning and at bedtime. 08/06/21  Yes [provider]  TOUJEO SOLOSTAR 300 UNIT/ML SOPN Inject 32 Units into the skin 2 (two) times daily.  11/21/15  Yes [provider]  traMADol (ULTRAM) 50 MG tablet Take 1 tablet (50 mg total) by mouth every 12 (twelve) hours as needed. 05/28/21  Yes Cristie Hem, PA-C  cephALEXin (KEFLEX) 500 MG capsule Take 1 capsule (500 mg total) by mouth 4 (four) times daily. To be taken after surgery 08/20/21   Cristie Hem, PA-C  oxyCODONE-acetaminophen (PERCOCET) 5-325 MG tablet Take 1-2 tablets by mouth every 6 (six) hours as needed. To be taken after surgery 08/18/21   Cristie Hem, PA-C     Positive ROS: All other systems have been reviewed and were otherwise negative with the exception of those mentioned in the HPI and as above.  Physical Exam: General: Alert, no acute distress Cardiovascular: No pedal edema Respiratory: No cyanosis, no use of accessory musculature GI: abdomen soft Skin: No lesions in the area of chief complaint Neurologic: Sensation intact distally Psychiatric: Patient is competent for consent with normal mood and affect Lymphatic: no lymphedema  MUSCULOSKELETAL: exam stable  Assessment: left knee degenerative joint disease  Plan: Plan for Procedure(s): LEFT TOTAL KNEE ARTHROPLASTY  The risks benefits and alternatives were discussed with the patient including but not limited to the risks of nonoperative treatment, versus surgical intervention including infection, bleeding, nerve injury,  blood clots, cardiopulmonary complications, morbidity, mortality, among others, and they were willing to proceed.   Glee Arvin, MD 08/26/2021 10:24 AM

## 2021-08-26 NOTE — Op Note (Signed)
Total Knee Arthroplasty Procedure Note  Preoperative diagnosis: Left knee osteoarthritis  Postoperative diagnosis:same  Operative findings: Complete loss of cartilage from all 3 compartments  Operative procedure: Left total knee arthroplasty. CPT [phone]  Surgeon: N. Glee Arvin, MD  Assist: Oneal Grout, PA-C; necessary for the timely completion of procedure and due to complexity of procedure.  Anesthesia: Spinal, regional, local  Tourniquet time: see anesthesia record  Implants used: [employer] Femur: PS 5 narrow Tibia: 4 Patella: 32 mm Polyethylene: 12 mm  Indication: Kristin Anthony is a 71 y.o. year old female with a history of knee pain. Having failed conservative management, the patient elected to proceed with a total knee arthroplasty.  We have reviewed the risk and benefits of the surgery and they elected to proceed after voicing understanding.  Procedure:  After informed consent was obtained and understanding of the risk were voiced including but not limited to bleeding, infection, damage to surrounding structures including nerves and vessels, blood clots, leg length inequality and the failure to achieve desired results, the operative extremity was marked with verbal confirmation of the patient in the holding area.   The patient was then brought to the operating room and transported to the operating room table in the supine position.  A tourniquet was applied to the operative extremity around the upper thigh. The operative limb was then prepped and draped in the usual sterile fashion and preoperative antibiotics were administered.  A time out was performed prior to the start of surgery confirming the correct extremity, preoperative antibiotic administration, as well as team members, implants and instruments available for the case. Correct surgical site was also confirmed with preoperative radiographs. The limb was then elevated for exsanguination and  the tourniquet was inflated. A midline incision was made and a standard medial parapatellar approach was performed.  The infrapatellar fat pad was removed.  Suprapatellar synovium was removed to reveal the anterior distal femoral cortex.  A medial peel was performed to release the capsule of the medial tibial plateau.  The patella was then everted and was prepared and sized to a 32 mm.  A cover was placed on the patella for protection from retractors.  The knee was then brought into full flexion and we then turned our attention to the femur.  The cruciates were sacrificed.  Start site was drilled in the femur and the intramedullary distal femoral cutting guide was placed, set at 5 degrees valgus, taking 9.5 mm of distal resection. The distal cut was made. Osteophytes were then removed.  Next, the proximal tibial cutting guide was placed with appropriate slope, varus/valgus alignment and depth of resection. The proximal tibial cut was made taking 2 mm off the low side. Gap blocks were then used to assess the extension gap and alignment, and appropriate soft tissue releases were performed. Attention was turned back to the femur, which was sized using the sizing guide to a size 5 narrow. Appropriate rotation of the femoral component was determined using epicondylar axis, Whiteside's line, and assessing the flexion gap under ligament tension. The appropriate size 4-in-1 cutting block was placed and checked with an angel wing and cuts were made. Posterior femoral osteophytes and uncapped bone were then removed with the curved osteotome.  Trial components were placed, and stability was checked in full extension, mid-flexion, and deep flexion. Proper tibial rotation was determined and marked.  The patella tracked well with a mini lateral release.  The femoral lugs were then drilled. Trial  components were then removed and tibial preparation performed.  The tibia was sized for a size 4 component.   The bony surfaces were  irrigated with a pulse lavage and then dried. Bone cement was vacuum mixed on the back table, and the final components sized above were cemented into place.  Antibiotic irrigation was placed in the knee joint and soft tissues while the cement cured.  After cement had finished curing, excess cement was removed. The stability of the construct was re-evaluated throughout a range of motion and found to be acceptable. The trial liner was removed, the knee was copiously irrigated, and the knee was re-evaluated for any excess bone debris. The real polyethylene liner, 12 mm thick, was inserted and checked to ensure the locking mechanism had engaged appropriately. The tourniquet was deflated and hemostasis was achieved. The wound was irrigated with normal saline.  One gram of vancomycin powder was placed in the surgical bed.  Topical 0.25% bupivacaine and meloxicam was placed in the joint for postoperative pain.  Capsular closure was performed with a #1 vicryl, subcutaneous fat closed with a 0 vicryl suture, then subcutaneous tissue closed with interrupted 2.0 vicryl suture. The skin was then closed with a 2.0 nylon and dermabond. A sterile dressing was applied.  The patient was awakened in the operating room and taken to recovery in stable condition. All sponge, needle, and instrument counts were correct at the end of the case.  Kristin Anthony was necessary for opening, closing, retracting, limb positioning and overall facilitation and completion of the surgery.  Position: supine  Complications: none.  Time Out: performed   Drains/Packing: none  Estimated blood loss: minimal  Returned to Recovery Room: in good condition.   Antibiotics: yes   Mechanical VTE (DVT) Prophylaxis: sequential compression devices, TED thigh-high  Chemical VTE (DVT) Prophylaxis: xarelto  Fluid Replacement  Crystalloid: see anesthesia record Blood: none  FFP: none   Specimens Removed: 1 to pathology   Sponge and Instrument  Count Correct? yes   PACU: portable radiograph - knee AP and Lateral   Plan/RTC: Return in 2 weeks for wound check.   Weight Bearing/Load Lower Extremity: full   Implant Name Type Inv. Item Serial No. Manufacturer Lot No. LRB No. Used Action  CEMENT BONE REFOBACIN [id number] [place] - [id number] Cement CEMENT BONE REFOBACIN [id number] [place]  [employer] RECON(ORTH,TRAU,BIO,SG) [id number] Left 2 Implanted  PATELLA RESURF GEN II - [id number] Orthopedic Implant PATELLA RESURF GEN II  [employer] [id number] Left 1 Implanted  BASEPLATE TIBIAL SZ 4 LT - [id number] Knees BASEPLATE TIBIAL SZ 4 LT  [employer] [id number] Left 1 Implanted  COMP FEMORAL POST S5N LT - [id number] Stem COMP FEMORAL POST S5N LT  [employer] [id number] Left 1 Implanted  INSERT TIB PS SZ3-4 12 - [id number] Miscellaneous INSERT TIB PS SZ3-4 12  [employer] [id number] Left 1 Implanted    N. Glee Arvin, MD [facility] 2:23 PM

## 2021-08-26 NOTE — Anesthesia Postprocedure Evaluation (Signed)
Anesthesia Post Note  Patient: Kristin Anthony  Procedure(s) Performed: LEFT TOTAL KNEE ARTHROPLASTY (Left: Knee)     Patient location during evaluation: PACU Anesthesia Type: Spinal Level of consciousness: awake and alert Pain management: pain level controlled Vital Signs Assessment: post-procedure vital signs reviewed and stable Respiratory status: spontaneous breathing and respiratory function stable Cardiovascular status: blood pressure returned to baseline and stable Postop Assessment: spinal receding Anesthetic complications: no   No notable events documented.  Last Vitals:  Vitals:   08/26/21 1515 08/26/21 1530  BP: (!) 145/84 129/75  Pulse: 95 85  Resp: 18 11  Temp:    SpO2: 97% 95%    Last Pain:  Vitals:   08/26/21 1530  TempSrc:   PainSc: 10-Worst pain ever                 Kennieth Rad

## 2021-08-26 NOTE — Anesthesia Procedure Notes (Signed)
Procedure Name: MAC Date/Time: 08/26/2021 1:04 PM  Performed by: Valda Favia, CRNAPre-anesthesia Checklist: Patient identified, Emergency Drugs available, Suction available, Patient being monitored and Timeout performed Patient Re-evaluated:Patient Re-evaluated prior to induction Oxygen Delivery Method: Simple face mask Preoxygenation: Pre-oxygenation with 100% oxygen Placement Confirmation: positive ETCO2 Dental Injury: Teeth and Oropharynx as per pre-operative assessment

## 2021-08-26 NOTE — Anesthesia Procedure Notes (Signed)
Anesthesia Regional Block: Adductor canal block   Pre-Anesthetic Checklist: , timeout performed,  Correct Patient, Correct Site, Correct Laterality,  Correct Procedure, Correct Position, site marked,  Risks and benefits discussed,  Surgical consent,  Pre-op evaluation,  At surgeon's request and post-op pain management  Laterality: Left  Prep: chloraprep       Needles:  Injection technique: Single-shot  Needle Type: Echogenic Needle     Needle Length: 9cm  Needle Gauge: 21     Additional Needles:   Procedures:,,,, ultrasound used (permanent image in chart),,    Narrative:  Start time: 08/26/2021 10:57 AM End time: 08/26/2021 11:02 AM Injection made incrementally with aspirations every 5 mL.  Performed by: Personally  Anesthesiologist: Marcene Duos, MD

## 2021-08-26 NOTE — Discharge Instructions (Signed)

## 2021-08-26 NOTE — Anesthesia Procedure Notes (Addendum)
Spinal  Patient location during procedure: OR Start time: 08/26/2021 12:35 PM End time: 08/26/2021 12:45 PM Reason for block: surgical anesthesia Staffing Performed: anesthesiologist  Anesthesiologist: Marcene Duos, MD Performed by: Marcene Duos, MD Authorized by: Marcene Duos, MD   Preanesthetic Checklist Completed: patient identified, IV checked, site marked, risks and benefits discussed, surgical consent, monitors and equipment checked, pre-op evaluation and timeout performed Spinal Block Patient position: sitting Prep: DuraPrep Patient monitoring: heart rate, cardiac monitor, continuous pulse ox and blood pressure Approach: right paramedian Location: L3-4 Injection technique: single-shot Needle Needle type: Quincke  Needle gauge: 22 G Needle length: 9 cm Assessment Sensory level: T4 Events: CSF return

## 2021-08-26 NOTE — Evaluation (Signed)
Physical Therapy Evaluation Patient Details Name: Kristin Anthony MRN: [id number] DOB: 1950/07/24 Today's Date: 08/26/2021  History of Present Illness  The pt is a 70 yo female presenting 8/14 for L TKA. PMH inlcudes: arthritis, depression, DM II, hx DVT, HLD, hiatal hernia, HTN, back surgery and R TKA in 2019.   Clinical Impression  Pt in bed upon arrival of PT, agreeable to evaluation at this time. 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 The pt now presents with limitations in functional mobility, ROM, strength, power, stability, and endurance due to above dx and resulting pain, and will continue to benefit from skilled PT to address these deficits. The pt was able to complete bed mobility with minA, but needed increased time, effort, and BUE support in addition to minA to complete sit-stand transfers and hallway ambulation. The pt was limited by pain and nausea at this time, but did make good progress for first attempts OOB. The pt will continue to benefit from skilled PT acutely to include stair training prior to return home. Suspect the pt will progress well and be safe to return home with family assist when medically stable.         Recommendations for follow up therapy are one component of a multi-disciplinary discharge planning process, led by the attending physician.  Recommendations may be updated based on patient status, additional functional criteria and insurance authorization.  Follow Up Recommendations Follow physician's recommendations for discharge plan and follow up therapies      Assistance Recommended at Discharge Frequent or constant Supervision/Assistance  Patient can return home with the following  A little help with walking and/or transfers;A little help with bathing/dressing/bathroom;Assistance with cooking/housework;Assistance with feeding;Direct  supervision/assist for medications management;Direct supervision/assist for financial management;Assist for transportation;Help with stairs or ramp for entrance    Equipment Recommendations BSC/3in1  Recommendations for Other Services       Functional Status Assessment Patient has had a recent decline in their functional status and demonstrates the ability to make significant improvements in function in a reasonable and predictable amount of time.     Precautions / Restrictions Precautions Precautions: Fall;Knee Precaution Booklet Issued: Yes (comment) Precaution Comments: pt verbally educated Restrictions Weight Bearing Restrictions: Yes LLE Weight Bearing: Weight bearing as tolerated      Mobility  Bed Mobility Overal bed mobility: Needs Assistance Bed Mobility: Supine to Sit, Sit to Supine     Supine to sit: Min assist Sit to supine: Min assist   General bed mobility comments: minA to manage LLE, slowed movements    Transfers Overall transfer level: Needs assistance Equipment used: Rolling walker (2 wheels) Transfers: Sit to/from Stand Sit to Stand: Min assist           General transfer comment: minA to rise to standing, no overt LOB. dependent on UE support to rise    Ambulation/Gait Ambulation/Gait assistance: Min assist Gait Distance (Feet): 45 Feet Assistive device: Rolling walker (2 wheels) Gait Pattern/deviations: Step-to pattern, Decreased stride length, Decreased weight shift to left Gait velocity: decreased Gait velocity interpretation: <1.31 ft/sec, indicative of household ambulator   General Gait Details: pt with slow, antalgic gait with poor wt acceptance to LLE, especially with fatigue. no instances of buckling at this time     Balance Overall balance assessment: Needs assistance Sitting-balance support: No upper extremity supported, Feet supported Sitting balance-Leahy Scale: Good  Standing balance support: Bilateral upper extremity  supported, During functional activity Standing balance-Leahy Scale: Fair Standing balance comment: can static stand without BUE support, dependent on BUE with mobility for pain management                             Pertinent Vitals/Pain Pain Assessment Pain Assessment: Faces Faces Pain Scale: Hurts whole lot Pain Location: L knee, whole leg Pain Descriptors / Indicators: Discomfort, Grimacing, Operative site guarding, Sore Pain Intervention(s): Limited activity within patient's tolerance, Monitored during session, Premedicated before session, Repositioned, Patient requesting pain meds-RN notified, Ice applied    Home Living Family/patient expects to be discharged to:: Private residence Living Arrangements: Spouse/significant other;Non-relatives/Friends Available Help at Discharge: Family;Available 24 hours/day Type of Home: House Home Access: Stairs to enter Entrance Stairs-Rails: Can reach both Entrance Stairs-Number of Steps: 3 Alternate Level Stairs-Number of Steps: 6 Home Layout: Multi-level;1/2 bath on main level;Bed/bath upstairs Home Equipment: Rolling Walker (2 wheels) Additional Comments: 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 ADLs Comments: pt independent, completed ADLs and IADLs without assist. assists men who live with her (mental and physical disabilities, but pt states she does not have to lift the men)     Hand Dominance   Dominant Hand: Right    Extremity/Trunk Assessment   Upper Extremity Assessment Upper Extremity Assessment: Overall WFL for tasks assessed    Lower Extremity Assessment Lower Extremity Assessment: LLE deficits/detail LLE Deficits / Details: limited by pain, reports no numbness or tingling. no knee buckling in stance. flexion to 79 deg LLE: Unable to fully  assess due to pain LLE Sensation: WNL LLE Coordination: WNL    Cervical / Trunk Assessment Cervical / Trunk Assessment: Normal  Communication   Communication: No difficulties  Cognition Arousal/Alertness: Awake/alert Behavior During Therapy: WFL for tasks assessed/performed Overall Cognitive Status: Within Functional Limits for tasks assessed                                          General Comments General comments (skin integrity, edema, etc.): VSS on RA, nausea with movement    Exercises Total Joint Exercises Ankle Circles/Pumps: AROM, Both, 5 reps, Seated Quad Sets: AROM, Both, 5 reps, Supine Heel Slides: AROM, Both, 5 reps, Supine Hip ABduction/ADduction: AROM, Both, 5 reps, Supine Knee Flexion: AAROM Goniometric ROM: 79 deg   Assessment/Plan    PT Assessment Patient needs continued PT services  PT Problem List Decreased strength;Decreased range of motion;Decreased activity tolerance;Decreased balance;Decreased mobility;Decreased coordination;Pain       PT Treatment Interventions DME instruction;Gait training;Stair training;Functional mobility training;Therapeutic activities;Therapeutic exercise;Balance training;Patient/family education    PT Goals (Current goals can be found in the Care Plan section)  Acute Rehab PT Goals Patient Stated Goal: to reduce pain and return home PT Goal Formulation: With patient Time For Goal Achievement: 09/09/21 Potential to Achieve Goals: Good    Frequency 7X/week        AM-PAC PT "6 Clicks" Mobility  Outcome Measure Help needed turning from your back to your side while in a flat bed without using bedrails?: A Little Help needed moving from lying on your back to sitting on the  side of a flat bed without using bedrails?: A Little Help needed moving to and from a bed to a chair (including a wheelchair)?: A Little Help needed standing up from a chair using your arms (e.g., wheelchair or bedside chair)?: A  Little Help needed to walk in hospital room?: A Little Help needed climbing 3-5 steps with a railing? : A Little 6 Click Score: 18    End of Session Equipment Utilized During Treatment: Gait belt Activity Tolerance: Patient tolerated treatment well;Patient limited by pain (nausea, pain) Patient left: in bed;with call bell/phone within reach;with nursing/sitter in room Nurse Communication: Mobility status;Patient requests pain meds PT Visit Diagnosis: Other abnormalities of gait and mobility (R26.89);Muscle weakness (generalized) (M62.81);Pain Pain - Right/Left: Left Pain - part of body: Knee    Time: 2353-6144 PT Time Calculation (min) (ACUTE ONLY): 25 min   Charges:   PT Evaluation $PT Eval Low Complexity: 1 Low PT Treatments $Therapeutic Activity: 8-22 mins        Vickki Muff, PT, DPT   Acute Rehabilitation Department  Ronnie Derby 08/26/2021, 5:30 PM

## 2021-08-26 NOTE — Transfer of Care (Signed)
Immediate Anesthesia Transfer of Care Note  Patient: Kristin Anthony  Procedure(s) Performed: LEFT TOTAL KNEE ARTHROPLASTY (Left: Knee)  Patient Location: PACU  Anesthesia Type:Spinal and MAC combined with regional for post-op pain  Level of Consciousness: awake, alert  and oriented  Airway & Oxygen Therapy: Patient Spontanous Breathing  Post-op Assessment: Report given to RN and Post -op Vital signs reviewed and stable  Post vital signs: Reviewed and stable  Last Vitals:  Vitals Value Taken Time  BP 128/74 08/26/21 1503  Temp    Pulse 102 08/26/21 1509  Resp 21 08/26/21 1509  SpO2 96 % 08/26/21 1509  Vitals shown include unvalidated device data.  Last Pain:  Vitals:   08/26/21 1105  TempSrc:   PainSc: 0-No pain      Patients Stated Pain Goal: 5 (86/75/44 9201)  Complications: No notable events documented.

## 2021-08-27 ENCOUNTER — Encounter (HOSPITAL_COMMUNITY): Payer: Self-pay | Admitting: Orthopaedic Surgery

## 2021-08-27 DIAGNOSIS — Z96651 Presence of right artificial knee joint: Secondary | ICD-10-CM | POA: Diagnosis not present

## 2021-08-27 DIAGNOSIS — Z79899 Other long term (current) drug therapy: Secondary | ICD-10-CM | POA: Diagnosis not present

## 2021-08-27 DIAGNOSIS — Z794 Long term (current) use of insulin: Secondary | ICD-10-CM | POA: Diagnosis not present

## 2021-08-27 DIAGNOSIS — I10 Essential (primary) hypertension: Secondary | ICD-10-CM | POA: Diagnosis not present

## 2021-08-27 DIAGNOSIS — M1712 Unilateral primary osteoarthritis, left knee: Secondary | ICD-10-CM | POA: Diagnosis not present

## 2021-08-27 DIAGNOSIS — Z7982 Long term (current) use of aspirin: Secondary | ICD-10-CM | POA: Diagnosis not present

## 2021-08-27 DIAGNOSIS — E119 Type 2 diabetes mellitus without complications: Secondary | ICD-10-CM | POA: Diagnosis not present

## 2021-08-27 DIAGNOSIS — Z86718 Personal history of other venous thrombosis and embolism: Secondary | ICD-10-CM | POA: Diagnosis not present

## 2021-08-27 LAB — GLUCOSE, CAPILLARY
Glucose-Capillary: 244 mg/dL — ABNORMAL HIGH (ref 70–99)
Glucose-Capillary: 169 mg/dL — ABNORMAL HIGH (ref 70–99)
Glucose-Capillary: 249 mg/dL — ABNORMAL HIGH (ref 70–99)
Glucose-Capillary: 211 mg/dL — ABNORMAL HIGH (ref 70–99)

## 2021-08-27 LAB — CBC
HCT: 33.4 % — ABNORMAL LOW (ref 36.0–46.0)
Hemoglobin: 10.8 g/dL — ABNORMAL LOW (ref 12.0–15.0)
MCH: 29.9 pg (ref 26.0–34.0)
MCHC: 32.3 g/dL (ref 30.0–36.0)
MCV: 92.5 fL (ref 80.0–100.0)
Platelets: 189 10*3/uL (ref 150–400)
RBC: 3.61 MIL/uL — ABNORMAL LOW (ref 3.87–5.11)
RDW: 13 % (ref 11.5–15.5)
WBC: 10.8 10*3/uL — ABNORMAL HIGH (ref 4.0–10.5)
nRBC: 0 % (ref 0.0–0.2)

## 2021-08-27 MED ORDER — INSULIN ASPART 100 UNIT/ML IJ SOLN
0.0000 [IU] | Freq: Three times a day (TID) | INTRAMUSCULAR | Status: DC
  Administered 2021-08-28: 7 [IU] via SUBCUTANEOUS

## 2021-08-27 MED ORDER — PREGABALIN 25 MG PO CAPS
50.0000 mg | ORAL_CAPSULE | Freq: Two times a day (BID) | ORAL | Status: DC
  Administered 2021-08-27 – 2021-08-28 (×2): 50 mg via ORAL
  Filled 2021-08-27 (×2): qty 2

## 2021-08-27 MED ORDER — HYDRALAZINE HCL 10 MG PO TABS
25.0000 mg | ORAL_TABLET | Freq: Every day | ORAL | Status: DC
  Administered 2021-08-27 – 2021-08-28 (×2): 25 mg via ORAL
  Filled 2021-08-27 (×2): qty 3

## 2021-08-27 MED ORDER — MONTELUKAST SODIUM 10 MG PO TABS
10.0000 mg | ORAL_TABLET | Freq: Every day | ORAL | Status: DC
  Administered 2021-08-27: 10 mg via ORAL
  Filled 2021-08-27: qty 1

## 2021-08-27 NOTE — Evaluation (Signed)
Occupational Therapy Evaluation Patient Details Name: Kristin Anthony MRN: [id number] DOB: 04-27-1950 Today's Date: 08/27/2021   History of Present Illness The pt is a 71 yo female presenting 8/14 for L TKA. PMH inlcudes: arthritis, depression, DM II, hx DVT, HLD, hiatal hernia, HTN, back surgery and R TKA in 2019.   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 Upon evaluation, pt reporting pain and frequently requesting to stay in the hospital an additional night> Pt performing functional mobility ~12 feet with RW with min guard A. Performing LB dressing with min A using AE. Pt educated and demonstrating LB dressing, toilet transfer, bed mobility, and shower transfer within precautions. Requiring mod A for shower transfer, and mod verbal cues for new learning. Pt reporting she will take sponge baths for several days when she returns home. Follow physicians recommendations for discharge. Will continue to follow acutely as admitted.      Recommendations for follow up therapy are one component of a multi-disciplinary discharge planning process, led by the attending physician.  Recommendations may be updated based on patient status, additional functional criteria and insurance authorization.   Follow Up Recommendations  Follow physician's recommendations for discharge plan and follow up therapies    Assistance Recommended at Discharge Intermittent Supervision/Assistance  Patient can return home with the following A little help with walking and/or transfers;A little help with bathing/dressing/bathroom;Help with stairs or ramp for entrance;Assist for transportation;Assistance with cooking/housework    Functional Status Assessment  Patient has had a recent decline in their functional status and demonstrates the ability to make significant improvements in function in a reasonable and predictable amount  of time.  Equipment Recommendations  BSC/3in1    Recommendations for Other Services       Precautions / Restrictions Precautions Precautions: Fall;Knee Precaution Booklet Issued: Yes (comment) Precaution Comments: pt verbally educated Restrictions Weight Bearing Restrictions: Yes LLE Weight Bearing: Weight bearing as tolerated      Mobility Bed Mobility Overal bed mobility: Needs Assistance Bed Mobility: Supine to Sit, Sit to Supine     Supine to sit: Min assist Sit to supine: Min assist   General bed mobility comments: minA to manage LLE, slowed movements    Transfers Overall transfer level: Needs assistance Equipment used: Rolling walker (2 wheels) Transfers: Sit to/from Stand Sit to Stand: Min guard, Min assist           General transfer comment: Min A for initial rise from EOB; min guard for additional sit<>stands. Dependent on UE to support rise      Balance Overall balance assessment: Needs assistance Sitting-balance support: No upper extremity supported, Feet supported Sitting balance-Leahy Scale: Good Sitting balance - Comments: Able to reach forward to ankle sitting EOB, however, unable to reach toe   Standing balance support: Bilateral upper extremity supported, During functional activity Standing balance-Leahy Scale: Fair Standing balance comment: can static stand without BUE support, dependent on BUE with mobility for pain management                           ADL either performed or assessed with clinical judgement   ADL Overall ADL's : Needs assistance/impaired Eating/Feeding: Modified independent;Sitting   Grooming: Standing;Min guard   Upper Body Bathing: Set up;Sitting   Lower Body Bathing: Min guard;Sit to/from stand   Upper Body Dressing : Set up;Sitting   Lower Body  Dressing: Minimal assistance;Sit to/from stand Lower Body Dressing Details (indicate cue type and reason): Min A for new learning with use of sock aid and  reacher for compensatory techniques for dressing Toilet Transfer: Min guard;BSC/3in1;Rolling walker (2 wheels);Ambulation Toilet Transfer Details (indicate cue type and reason): Performing in room during session     Tub/ Shower Transfer: Tub transfer;Moderate assistance;Cueing for sequencing;Cueing for safety;Ambulation;Rolling walker (2 wheels);BSC/3in1 Tub/Shower Transfer Details (indicate cue type and reason): Pt with difficulty coordinating simulated tub transfer, mod multimodal cues and mod A to bring RLE into tub Functional mobility during ADLs: Min guard;Rolling walker (2 wheels) General ADL Comments: Noting limp with functional mobility. Limited by activity tolerance.     Vision   Vision Assessment?: No apparent visual deficits     Perception     Praxis      Pertinent Vitals/Pain Pain Assessment Pain Assessment: Faces Faces Pain Scale: Hurts whole lot Pain Location: L knee, whole leg Pain Descriptors / Indicators: Discomfort, Grimacing, Operative site guarding, Sore Pain Intervention(s): Limited activity within patient's tolerance, Monitored during session, Repositioned, Relaxation, Ice applied     Hand Dominance Right   Extremity/Trunk Assessment Upper Extremity Assessment Upper Extremity Assessment: Overall WFL for tasks assessed   Lower Extremity Assessment Lower Extremity Assessment: Defer to PT evaluation   Cervical / Trunk Assessment Cervical / Trunk Assessment: Normal   Communication Communication Communication: No difficulties   Cognition Arousal/Alertness: Awake/alert Behavior During Therapy: WFL for tasks assessed/performed Overall Cognitive Status: Within Functional Limits for tasks assessed                                 General Comments: Pt pleasant and conversational, however, reporting pain throughout session. Pt requesting multiple times to stay an additional night in the hospital for pain management purposes.     General  Comments  VSS on RA    Exercises     Shoulder Instructions      Home Living Family/patient expects to be discharged to:: Private residence Living Arrangements: Spouse/significant other;Non-relatives/Friends Available Help at Discharge: Family;Available 24 hours/day Type of Home: House Home Access: Stairs to enter Entergy CorporationEntrance Stairs-Number of Steps: 3 Entrance Stairs-Rails: Can reach both Home Layout: Multi-level;1/2 bath on main level;Bed/bath upstairs Alternate Level Stairs-Number of Steps: 6 Alternate Level Stairs-Rails: Can reach both Bathroom Shower/Tub: Chief Strategy OfficerTub/shower unit   Bathroom Toilet: Standard     Home Equipment: Agricultural consultantolling Walker (2 wheels)   Additional Comments: 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 ADLs Comments: pt independent, completed ADLs and IADLs without assist. assists men who live with her (mental and physical disabilities, but pt states she does not have to lift the men)        OT Problem List: Decreased strength;Decreased range of motion;Decreased activity tolerance;Impaired balance (sitting and/or standing);Decreased knowledge of use of DME or AE;Decreased knowledge of precautions;Pain;Decreased safety awareness      OT Treatment/Interventions: Self-care/ADL training;Therapeutic exercise;DME and/or AE instruction;Therapeutic activities;Patient/family education;Balance training    OT Goals(Current goals can be found in the care plan section) Acute Rehab OT Goals Patient Stated Goal: Stay in hospital one more night OT Goal Formulation: With patient Time For Goal Achievement: 09/10/21 Potential to Achieve Goals: Good  OT Frequency: Min 2X/week  Co-evaluation              AM-PAC OT "6 Clicks" Daily Activity     Outcome Measure Help from another person eating  meals?: None Help from another person taking care of personal grooming?: A Little Help from another person toileting, which includes using toliet, bedpan, or urinal?: A Little Help from another person bathing (including washing, rinsing, drying)?: A Little Help from another person to put on and taking off regular upper body clothing?: A Little Help from another person to put on and taking off regular lower body clothing?: A Little 6 Click Score: 19   End of Session Equipment Utilized During Treatment: Gait belt;Rolling walker (2 wheels) Nurse Communication: Mobility status  Activity Tolerance: Patient tolerated treatment well Patient left: in bed;with call bell/phone within reach  OT Visit Diagnosis: Unsteadiness on feet (R26.81);Muscle weakness (generalized) (M62.81);Other abnormalities of gait and mobility (R26.89);Pain Pain - Right/Left: Left Pain - part of body: Knee                Time: 1610-9604 OT Time Calculation (min): 27 min Charges:  OT General Charges $OT Visit: 1 Visit OT Evaluation $OT Eval Low Complexity: 1 Low OT Treatments $Self Care/Home Management : 8-22 mins  Ladene Artist, OTR/L [facility] Acute Rehabilitation Office: [phone]   Drue Novel 08/27/2021, 11:05 AM

## 2021-08-27 NOTE — TOC Transition Note (Signed)
Transition of Care [facility]) - CM/SW Discharge Note   Patient Details  Name: Kristin Anthony MRN: [id number] Date of Birth: 03-18-1950  Transition of Care [facility]) CM/SW Contact:  Kermit Balo, RN Phone Number: 08/27/2021, 9:59 AM   Clinical Narrative:    Pt is discharging home with home health services through [place]. Information on the AVS.  DME will be provided by bedside RN.  Pt has transport home.    Final next level of care: Home w Home Health Services Barriers to Discharge: No Barriers Identified   Patient Goals and CMS Choice        Discharge Placement                       Discharge Plan and Services                          HH Arranged: PT HH Agency: [facility] Date [facility] Agency Contacted: 08/27/21   Representative spoke with at [facility] Agency: Kandee Keen  Social Determinants of Health (SDOH) Interventions     Readmission Risk Interventions     No data to display

## 2021-08-27 NOTE — Progress Notes (Signed)
Physical Therapy Treatment Patient Details Name: Kristin Anthony MRN: [id number] DOB: 1950/10/11 Today's Date: 08/27/2021   History of Present Illness The pt is a 71 y/o female presenting 8/14 for L TKA. PMH inlcudes: arthritis, depression, DM II, hx DVT, HLD, hiatal hernia, HTN, back surgery and R TKA in 2019.    PT Comments    Pt progressing slowly towards physical therapy goals.  Pt internally distracted throughout session regarding staying an additional night. Pt complaining of increased pain throughout session and we were not able to progress to stair training at this time. Recommend pt work with therapy at least one more session to initiate stair training to facilitate safe d/c home. Will continue to follow.    Recommendations for follow up therapy are one component of a multi-disciplinary discharge planning process, led by the attending physician.  Recommendations may be updated based on patient status, additional functional criteria and insurance authorization.  Follow Up Recommendations  Follow physician's recommendations for discharge plan and follow up therapies     Assistance Recommended at Discharge Frequent or constant Supervision/Assistance  Patient can return home with the following A little help with walking and/or transfers;A little help with bathing/dressing/bathroom;Assistance with cooking/housework;Assist for transportation;Help with stairs or ramp for entrance   Equipment Recommendations  BSC/3in1    Recommendations for Other Services       Precautions / Restrictions Precautions Precautions: Fall;Knee Precaution Booklet Issued: Yes (comment) Precaution Comments: Pt was educated on NO pillow/roll/ice pack under knee to promote extension. Restrictions Weight Bearing Restrictions: Yes LLE Weight Bearing: Weight bearing as tolerated     Mobility  Bed Mobility Overal bed mobility: Needs Assistance Bed Mobility: Supine to Sit     Supine to sit: Min assist      General bed mobility comments: Assist for LLE advancement towards EOB. Pt coming into long sitting without assist.    Transfers Overall transfer level: Needs assistance Equipment used: Rolling walker (2 wheels) Transfers: Sit to/from Stand Sit to Stand: Min guard           General transfer comment: Increased time. Close guard for safety as pt powered up to full stand. VC's for hand placement on seated surface for safety prior to initiating stand>sit.    Ambulation/Gait Ambulation/Gait assistance: Min guard Gait Distance (Feet): 75 Feet Assistive device: Rolling walker (2 wheels) Gait Pattern/deviations: Step-to pattern, Decreased stride length, Decreased weight shift to left Gait velocity: decreased Gait velocity interpretation: <1.31 ft/sec, indicative of household ambulator   General Gait Details: pt with slow, antalgic gait with poor wt acceptance to LLE, especially with fatigue. no instances of buckling at this time   Social research officer, government Rankin (Stroke Patients Only)       Balance Overall balance assessment: Needs assistance Sitting-balance support: No upper extremity supported, Feet supported Sitting balance-Leahy Scale: Good Sitting balance - Comments: Able to reach forward to ankle sitting EOB, however, unable to reach toe   Standing balance support: Bilateral upper extremity supported, During functional activity, Reliant on assistive device for balance Standing balance-Leahy Scale: Poor Standing balance comment: Dynamically, requires BUE support on RW.                            Cognition Arousal/Alertness: Awake/alert Behavior During Therapy: WFL for tasks assessed/performed Overall Cognitive Status: Within Functional Limits for tasks assessed  General Comments: Pt pleasant and conversational, however, reporting pain throughout session. Pt requesting multiple  times to stay an additional night in the hospital for pain management purposes.        Exercises Total Joint Exercises Ankle Circles/Pumps: 10 reps Quad Sets: 10 reps Heel Slides: 10 reps, Seated Hip ABduction/ADduction: 10 reps, Seated Long Arc Quad: 20 reps Long Arc Quad Limitations: Limited AROM Goniometric ROM: 80    General Comments        Pertinent Vitals/Pain Pain Assessment Pain Assessment: Faces Faces Pain Scale: Hurts whole lot Pain Location: L knee, whole leg Pain Descriptors / Indicators: Discomfort, Grimacing, Operative site guarding, Sore Pain Intervention(s): Limited activity within patient's tolerance, Monitored during session, Repositioned    Home Living                          Prior Function            PT Goals (current goals can now be found in the care plan section) Acute Rehab PT Goals Patient Stated Goal: to reduce pain and return home PT Goal Formulation: With patient Time For Goal Achievement: 09/09/21 Potential to Achieve Goals: Good Progress towards PT goals: Progressing toward goals    Frequency    7X/week      PT Plan Current plan remains appropriate    Co-evaluation              AM-PAC PT "6 Clicks" Mobility   Outcome Measure  Help needed turning from your back to your side while in a flat bed without using bedrails?: A Little Help needed moving from lying on your back to sitting on the side of a flat bed without using bedrails?: A Little Help needed moving to and from a bed to a chair (including a wheelchair)?: A Little Help needed standing up from a chair using your arms (e.g., wheelchair or bedside chair)?: A Little Help needed to walk in hospital room?: A Little Help needed climbing 3-5 steps with a railing? : A Little 6 Click Score: 18    End of Session Equipment Utilized During Treatment: Gait belt Activity Tolerance: Patient limited by pain Patient left: in bed;with call bell/phone within  reach;with nursing/sitter in room Nurse Communication: Mobility status;Patient requests pain meds PT Visit Diagnosis: Other abnormalities of gait and mobility (R26.89);Muscle weakness (generalized) (M62.81);Pain Pain - Right/Left: Left Pain - part of body: Knee     Time: 6734-1937 PT Time Calculation (min) (ACUTE ONLY): 38 min  Charges:  $Gait Training: 23-37 mins $Therapeutic Exercise: 8-22 mins                     Conni Slipper, PT, DPT Acute Rehabilitation Services Secure Chat Preferred Office: [phone]    Marylynn Pearson 08/27/2021, 3:07 PM

## 2021-08-27 NOTE — Plan of Care (Signed)
  Problem: Education: Goal: Ability to describe self-care measures that may prevent or decrease complications (Diabetes Survival Skills Education) will improve Outcome: Completed/Met Goal: Individualized Educational Video(s) Outcome: Completed/Met   Problem: Coping: Goal: Ability to adjust to condition or change in health will improve Outcome: Completed/Met   Problem: Fluid Volume: Goal: Ability to maintain a balanced intake and output will improve Outcome: Completed/Met   Problem: Health Behavior/Discharge Planning: Goal: Ability to identify and utilize available resources and services will improve Outcome: Completed/Met Goal: Ability to manage health-related needs will improve Outcome: Completed/Met   Problem: Health Behavior/Discharge Planning: Goal: Ability to manage health-related needs will improve Outcome: Completed/Met   Problem: Nutritional: Goal: Maintenance of adequate nutrition will improve Outcome: Completed/Met Goal: Progress toward achieving an optimal weight will improve Outcome: Completed/Met   Problem: Skin Integrity: Goal: Risk for impaired skin integrity will decrease Outcome: Completed/Met   Problem: Tissue Perfusion: Goal: Adequacy of tissue perfusion will improve Outcome: Completed/Met   Problem: Education: Goal: Knowledge of the prescribed therapeutic regimen will improve Outcome: Completed/Met Goal: Individualized Educational Video(s) Outcome: Completed/Met   Problem: Activity: Goal: Ability to avoid complications of mobility impairment will improve Outcome: Completed/Met Goal: Range of joint motion will improve Outcome: Completed/Met   Problem: Clinical Measurements: Goal: Postoperative complications will be avoided or minimized Outcome: Completed/Met   Problem: Pain Management: Goal: Pain level will decrease with appropriate interventions Outcome: Completed/Met   Problem: Skin Integrity: Goal: Will show signs of wound  healing Outcome: Completed/Met   Problem: Education: Goal: Knowledge of the prescribed therapeutic regimen will improve Outcome: Completed/Met Goal: Individualized Educational Video(s) Outcome: Completed/Met   Problem: Activity: Goal: Ability to avoid complications of mobility impairment will improve Outcome: Completed/Met Goal: Range of joint motion will improve Outcome: Completed/Met   Problem: Clinical Measurements: Goal: Postoperative complications will be avoided or minimized Outcome: Completed/Met   Problem: Pain Management: Goal: Pain level will decrease with appropriate interventions Outcome: Completed/Met   Problem: Skin Integrity: Goal: Will show signs of wound healing Outcome: Completed/Met

## 2021-08-27 NOTE — Progress Notes (Signed)
Physical Therapy Treatment Patient Details Name: Kristin Anthony MRN: [id number] DOB: 15-Feb-1950 Today's Date: 08/27/2021   History of Present Illness The pt is a 71 y/o female presenting 8/14 for L TKA. PMH inlcudes: arthritis, depression, DM II, hx DVT, HLD, hiatal hernia, HTN, back surgery and R TKA in 2019.    PT Comments    Pt progressing slowly towards physical therapy goals. Was able to perform transfers and ambulation with increased time and min assist with RW for support. We were not able to initiate stair training this session due to pain and fatigue. Pt asking for an additional night in the hospital prior to returning home with family support. Will see again for afternoon session to attempt to progress functional mobility and prepare pt for d/c home.    Recommendations for follow up therapy are one component of a multi-disciplinary discharge planning process, led by the attending physician.  Recommendations may be updated based on patient status, additional functional criteria and insurance authorization.  Follow Up Recommendations  Follow physician's recommendations for discharge plan and follow up therapies     Assistance Recommended at Discharge Frequent or constant Supervision/Assistance  Patient can return home with the following A little help with walking and/or transfers;A little help with bathing/dressing/bathroom;Assistance with cooking/housework;Assist for transportation;Help with stairs or ramp for entrance   Equipment Recommendations  BSC/3in1    Recommendations for Other Services       Precautions / Restrictions Precautions Precautions: Fall;Knee Precaution Booklet Issued: Yes (comment) Precaution Comments: pt verbally educated Restrictions Weight Bearing Restrictions: Yes LLE Weight Bearing: Weight bearing as tolerated     Mobility  Bed Mobility Overal bed mobility: Needs Assistance Bed Mobility: Supine to Sit     Supine to sit: Min assist      General bed mobility comments: Assist for LLE advancement towards EOB and for trunk elevation up into full sitting position.    Transfers Overall transfer level: Needs assistance Equipment used: Rolling walker (2 wheels) Transfers: Sit to/from Stand Sit to Stand: Min guard           General transfer comment: Increased time. Close guard for safety as pt powered up to full stand. VC's for hand placement on seated surface for safety prior to initiating stand>sit.    Ambulation/Gait Ambulation/Gait assistance: Min guard Gait Distance (Feet): 60 Feet Assistive device: Rolling walker (2 wheels) Gait Pattern/deviations: Step-to pattern, Decreased stride length, Decreased weight shift to left Gait velocity: decreased Gait velocity interpretation: <1.31 ft/sec, indicative of household ambulator   General Gait Details: pt with slow, antalgic gait with poor wt acceptance to LLE, especially with fatigue. no instances of buckling at this time   Social research officer, government Rank[employer] (Stroke Patients Only)       Balance Overall balance assessment: Needs assistance Sitting-balance support: No upper extremity supported, Feet supported Sitting balance-Leahy Scale: Good     Standing balance support: Bilateral upper extremity supported, During functional activity, Reliant on assistive device for balance Standing balance-Leahy Scale: Poor Standing balance comment: Dynamically, requires BUE support on RW.                            Cognition Arousal/Alertness: Awake/alert Behavior During Therapy: WFL for tasks assessed/performed Overall Cognitive Status: Within Functional Limits for tasks assessed  Exercises Total Joint Exercises Ankle Circles/Pumps: 10 reps Quad Sets: 10 reps Heel Slides: 10 reps, Seated Hip ABduction/ADduction: 10 reps, Seated Long Arc Quad: 10 reps,  Limitations Long Arc Quad Limitations: Limited AROM Goniometric ROM: ~70 seated    General Comments General comments (skin integrity, edema, etc.): VSS on RA      Pertinent Vitals/Pain Pain Assessment Pain Assessment: Faces Faces Pain Scale: Hurts whole lot Pain Location: L knee, whole leg Pain Descriptors / Indicators: Discomfort, Grimacing, Operative site guarding, Sore Pain Intervention(s): Limited activity within patient's tolerance, Monitored during session, Repositioned    Home Living Family/patient expects to be discharged to:: Private residence Living Arrangements: Spouse/significant other;Non-relatives/Friends Available Help at Discharge: Family;Available 24 hours/day Type of Home: House Home Access: Stairs to enter Entrance Stairs-Rails: Can reach both Entrance Stairs-Number of Steps: 3 Alternate Level Stairs-Number of Steps: 6 Home Layout: Multi-level;1/2 bath on main level;Bed/bath upstairs Home Equipment: Agricultural consultant (2 wheels) Additional Comments: 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    Prior Function            PT Goals (current goals can now be found in the care plan section) Acute Rehab PT Goals Patient Stated Goal: to reduce pain and return home PT Goal Formulation: With patient Time For Goal Achievement: 09/09/21 Potential to Achieve Goals: Good Progress towards PT goals: Progressing toward goals    Frequency    7X/week      PT Plan Current plan remains appropriate    Co-evaluation              AM-PAC PT "6 Clicks" Mobility   Outcome Measure  Help needed turning from your back to your side while in a flat bed without using bedrails?: A Little Help needed moving from lying on your back to sitting on the side of a flat bed without using bedrails?: A Little Help needed moving to and from a bed to a chair (including a wheelchair)?: A Little Help needed standing up from a chair using your arms (e.g., wheelchair or bedside  chair)?: A Little Help needed to walk in hospital room?: A Little Help needed climbing 3-5 steps with a railing? : A Little 6 Click Score: 18    End of Session Equipment Utilized During Treatment: Gait belt Activity Tolerance: Patient tolerated treatment well;Patient limited by pain (nausea, pain) Patient left: in bed;with call bell/phone within reach;with nursing/sitter in room Nurse Communication: Mobility status;Patient requests pain meds PT Visit Diagnosis: Other abnormalities of gait and mobility (R26.89);Muscle weakness (generalized) (M62.81);Pain Pain - Right/Left: Left Pain - part of body: Knee     Time: 0950-1009 PT Time Calculation (min) (ACUTE ONLY): 19 min  Charges:  $Gait Training: 8-22 mins                     Conni Slipper, PT, DPT Acute Rehabilitation Services Secure Chat Preferred Office: [phone]    Marylynn Pearson 08/27/2021, 12:40 PM

## 2021-08-27 NOTE — Discharge Summary (Addendum)
Patient ID: Kristin Anthony MRN: [id number] DOB/AGE: [birth year] 71 y.o.  Admit date: 08/26/2021 Discharge date: 08/28/2021  Admission Diagnoses:  Principal Problem:   Primary osteoarthritis of left knee Active Problems:   Status post total left knee replacement   Discharge Diagnoses:  Same  Past Medical History:  Diagnosis Date   Arthritis    Depression    Diabetes mellitus    DVT (deep venous thrombosis) (HCC)    High cholesterol    Hypertension     Surgeries: Procedure(s): LEFT TOTAL KNEE ARTHROPLASTY on 08/26/2021   Consultants:   Discharged Condition: Improved  Hospital Course: RENNY GUNNARSON is an 71 y.o. female who was admitted 08/26/2021 for operative treatment ofPrimary osteoarthritis of left knee. Patient has severe unremitting pain that affects sleep, daily activities, and work/hobbies. After pre-op clearance the patient was taken to the operating room on 08/26/2021 and underwent  Procedure(s): LEFT TOTAL KNEE ARTHROPLASTY.    Patient was given perioperative antibiotics:  Anti-infectives (From admission, onward)    Start     Dose/Rate Route Frequency Ordered Stop   08/26/21 1515  ceFAZolin (ANCEF) IVPB 2g/100 mL premix        2 g 200 mL/hr over 30 Minutes Intravenous Every 6 hours 08/26/21 1511 08/26/21 2148   08/26/21 1211  vancomycin (VANCOCIN) powder  Status:  Discontinued          As needed 08/26/21 1211 08/26/21 1458   08/26/21 0615  ceFAZolin (ANCEF) IVPB 2g/100 mL premix        2 g 200 mL/hr over 30 Minutes Intravenous On call to O.R. 08/26/21 0603 08/26/21 1252        Patient was given sequential compression devices, early ambulation, and chemoprophylaxis to prevent DVT.  Patient benefited maximally from hospital stay and there were no complications.    Recent vital signs: Patient Vitals for the past 24 hrs:  BP Temp Temp src Pulse Resp SpO2  08/28/21 0742 (!) 147/61 98.5 F (36.9 C) Oral (!) 103 18 98 %  08/28/21 0439 (!) 153/71 99 F (37.2  C) Oral (!) 113 18 95 %  08/28/21 0106 (!) 158/65 99.3 F (37.4 C) Oral (!) 107 16 93 %  08/27/21 2102 (!) 164/66 (!) 100.4 F (38 C) Oral (!) 123 18 98 %  08/27/21 1532 (!) 158/73 99.6 F (37.6 C) Oral (!) 119 -- 98 %  08/27/21 1235 (!) 161/66 99.8 F (37.7 C) Oral (!) 104 16 98 %     Recent laboratory studies:  Recent Labs    08/27/21 0651  WBC 10.8*  HGB 10.8*  HCT 33.4*  PLT 189     Discharge Medications:   Allergies as of 08/28/2021       Reactions   Codeine Nausea And Vomiting        Medication List     STOP taking these medications    celecoxib 200 MG capsule Commonly known as: CELEBREX   HYDROcodone-acetaminophen 5-325 MG tablet Commonly known as: Norco   meloxicam 7.5 MG tablet Commonly known as: MOBIC   traMADol 50 MG tablet Commonly known as: ULTRAM       TAKE these medications    amLODipine 10 MG tablet Commonly known as: NORVASC Take 10 mg by mouth at bedtime.   aspirin EC 81 MG tablet Take 1 tablet (81 mg total) by mouth 2 (two) times daily. Do not start taking until finished with xarelto What changed: additional instructions   atorvastatin 40 MG tablet Commonly known  as: LIPITOR Take 40 mg by mouth at bedtime.   cephALEXin 500 MG capsule Commonly known as: Keflex Take 1 capsule (500 mg total) by mouth 4 (four) times daily. To be taken after surgery   cholecalciferol 25 MCG (1000 UNIT) tablet Commonly known as: VITAMIN D3 Take 1,000 Units by mouth every other day.   docusate sodium 100 MG capsule Commonly known as: Colace Take 1 capsule (100 mg total) by mouth daily as needed.   HumaLOG KwikPen 100 UNIT/ML KiwkPen Generic drug: insulin lispro Inject 32 Units into the skin 2 (two) times daily.   hydrALAZINE 25 MG tablet Commonly known as: APRESOLINE Take 25 mg by mouth daily.   LORazepam 0.5 MG tablet Commonly known as: ATIVAN Take 0.5 mg by mouth in the morning and at bedtime.   methocarbamol 500 MG  tablet Commonly known as: ROBAXIN Take 1 tablet (500 mg total) by mouth 2 (two) times daily as needed.   montelukast 10 MG tablet Commonly known as: SINGULAIR Take 10 mg by mouth at bedtime.   olmesartan 40 MG tablet Commonly known as: BENICAR Take 40 mg by mouth daily.   ondansetron 4 MG tablet Commonly known as: Zofran Take 1 tablet (4 mg total) by mouth every 8 (eight) hours as needed for nausea or vomiting.   oxyCODONE-acetaminophen 5-325 MG tablet Commonly known as: Percocet Take 1-2 tablets by mouth every 6 (six) hours as needed. To be taken after surgery   pregabalin 50 MG capsule Commonly known as: LYRICA Take 50 mg by mouth in the morning and at bedtime.   rivaroxaban 10 MG Tabs tablet Commonly known as: XARELTO Take 1 tablet (10 mg total) by mouth daily. Take this medication daily x 30 days INSTEAD of aspirin.  Once finished with this medication, take aspirin prescription as prescribed   Toujeo SoloStar 300 UNIT/ML Solostar Pen Generic drug: insulin glargine (1 Unit Dial) Inject 32 Units into the skin 2 (two) times daily.               Durable Medical Equipment  (From admission, onward)           Start     Ordered   08/26/21 1635  DME Walker rolling  Once       Question Answer Comment  Walker: With 5 Inch Wheels   Patient needs a walker to treat with the following condition Status post left partial knee replacement      08/26/21 1634   08/26/21 1635  DME 3 n 1  Once        08/26/21 1634   08/26/21 1635  DME Bedside commode  Once       Question:  Patient needs a bedside commode to treat with the following condition  Answer:  Status post left partial knee replacement   08/26/21 1634            Diagnostic Studies: DG Knee Left Port  Result Date: 08/26/2021 CLINICAL DATA:  Status post left knee arthroplasty EXAM: PORTABLE LEFT KNEE - 2 VIEW COMPARISON:  July 25, 2021 FINDINGS: Interval left knee total arthroplasty with anatomic alignment.  Orthopedic hardware is intact and without periprosthetic fracture or lucency. Expected postoperative sequela including soft tissue gas. No acute fracture or dislocation. No unexpected radiopaque foreign body. IMPRESSION: Interval left knee total arthroplasty without evidence of complication. Electronically Signed   By: Jacob Moores M.D.   On: 08/26/2021 16:07    Disposition: Discharge disposition: 01-Home or Self Care  Follow-up Information     Tarry Kos, MD. Schedule an appointment as soon as possible for a visit in 2 week(s).   Specialty: Orthopedic Surgery Contact information: [address] [place] [address] [phone]         Care, [facility] Follow up.   Specialty: Home Health Services Why: The home helath agency will contact you for the first home visit Contact information: [address] [place] [address] [phone]                  Signed: Cristie Hem 08/28/2021, 7:51 AM

## 2021-08-27 NOTE — Progress Notes (Signed)
Subjective: 1 Day Post-Op Procedure(s) (LRB): LEFT TOTAL KNEE ARTHROPLASTY (Left) Patient reports pain as moderate.  Having issues with pain control, but still able to ambulate well.  Objective: Vital signs in last 24 hours: Temp:  [97.5 F (36.4 C)-98.6 F (37 C)] 98.6 F (37 C) (08/15 0732) Pulse Rate:  [77-110] 110 (08/15 0732) Resp:  [11-20] 16 (08/15 0732) BP: (112-163)/(65-89) 158/65 (08/15 0732) SpO2:  [94 %-100 %] 97 % (08/15 0732) Weight:  [89.8 kg] 89.8 kg (08/14 0916)  Intake/Output from previous day: 08/14 0701 - 08/15 0700 In: 2417.5 [P.O.:480; I.V.:1937.5] Out: 2750 [Urine:2650; Blood:100] Intake/Output this shift: No intake/output data recorded.  Recent Labs    08/27/21 0651  HGB 10.8*   Recent Labs    08/27/21 0651  WBC 10.8*  RBC 3.61*  HCT 33.4*  PLT 189   No results for input(s): "NA", "K", "CL", "CO2", "BUN", "CREATININE", "GLUCOSE", "CALCIUM" in the last 72 hours. No results for input(s): "LABPT", "INR" in the last 72 hours.  Neurologically intact Neurovascular intact Sensation intact distally Intact pulses distally Dorsiflexion/Plantar flexion intact Incision: scant drainage No cellulitis present Compartment soft   Assessment/Plan: 1 Day Post-Op Procedure(s) (LRB): LEFT TOTAL KNEE ARTHROPLASTY (Left) Advance diet Up with therapy D/C IV fluids Discharge home with home health today vs tomorrow depending on pain control and being cleared by PT WBAT LLE   Anticipated LOS equal to or greater than 2 midnights due to - Age 72 and older with one or more of the following:  - Obesity  - Expected need for hospital services (PT, OT, Nursing) required for safe  discharge  - Anticipated need for postoperative skilled nursing care or inpatient rehab  - Active co-morbidities: None OR   - Unanticipated findings during/Post Surgery: Slow post-op progression: GI, pain control, mobility  - Patient is a high risk of re-admission due to:  None   Cristie Hem 08/27/2021, 7:58 AM

## 2021-08-28 DIAGNOSIS — I10 Essential (primary) hypertension: Secondary | ICD-10-CM | POA: Diagnosis not present

## 2021-08-28 DIAGNOSIS — Z79899 Other long term (current) drug therapy: Secondary | ICD-10-CM | POA: Diagnosis not present

## 2021-08-28 DIAGNOSIS — Z7982 Long term (current) use of aspirin: Secondary | ICD-10-CM | POA: Diagnosis not present

## 2021-08-28 DIAGNOSIS — M1712 Unilateral primary osteoarthritis, left knee: Secondary | ICD-10-CM | POA: Diagnosis not present

## 2021-08-28 DIAGNOSIS — Z86718 Personal history of other venous thrombosis and embolism: Secondary | ICD-10-CM | POA: Diagnosis not present

## 2021-08-28 DIAGNOSIS — Z794 Long term (current) use of insulin: Secondary | ICD-10-CM | POA: Diagnosis not present

## 2021-08-28 DIAGNOSIS — E119 Type 2 diabetes mellitus without complications: Secondary | ICD-10-CM | POA: Diagnosis not present

## 2021-08-28 DIAGNOSIS — Z96651 Presence of right artificial knee joint: Secondary | ICD-10-CM | POA: Diagnosis not present

## 2021-08-28 DIAGNOSIS — Z96652 Presence of left artificial knee joint: Secondary | ICD-10-CM | POA: Diagnosis not present

## 2021-08-28 LAB — GLUCOSE, CAPILLARY: Glucose-Capillary: 217 mg/dL — ABNORMAL HIGH (ref 70–99)

## 2021-08-28 NOTE — Progress Notes (Signed)
Subjective: 2 Days Post-Op Procedure(s) (LRB): LEFT TOTAL KNEE ARTHROPLASTY (Left) Patient reports pain as mild.  Doing better with pain  Objective: Vital signs in last 24 hours: Temp:  [98.5 F (36.9 C)-100.4 F (38 C)] 98.5 F (36.9 C) (08/16 0742) Pulse Rate:  [103-123] 103 (08/16 0742) Resp:  [16-18] 18 (08/16 0742) BP: (147-164)/(61-73) 147/61 (08/16 0742) SpO2:  [93 %-98 %] 98 % (08/16 0742)  Intake/Output from previous day: 08/15 0701 - 08/16 0700 In: 480 [P.O.:480] Out: 400 [Urine:400] Intake/Output this shift: Total I/O In: 240 [P.O.:240] Out: -   Recent Labs    08/27/21 0651  HGB 10.8*   Recent Labs    08/27/21 0651  WBC 10.8*  RBC 3.61*  HCT 33.4*  PLT 189   No results for input(s): "NA", "K", "CL", "CO2", "BUN", "CREATININE", "GLUCOSE", "CALCIUM" in the last 72 hours. No results for input(s): "LABPT", "INR" in the last 72 hours.  Neurologically intact Neurovascular intact Sensation intact distally Intact pulses distally Dorsiflexion/Plantar flexion intact Incision: scant drainage No cellulitis present Compartment soft   Assessment/Plan: 2 Days Post-Op Procedure(s) (LRB): LEFT TOTAL KNEE ARTHROPLASTY (Left) Advance diet Up with therapy D/C IV fluids Discharge home with home health after first PT session WBAT LLE ABLA- mild and stable   Anticipated LOS equal to or greater than 2 midnights due to - Age 72 and older with one or more of the following:  - Obesity  - Expected need for hospital services (PT, OT, Nursing) required for safe  discharge  - Anticipated need for postoperative skilled nursing care or inpatient rehab  - Active co-morbidities: None OR   - Unanticipated findings during/Post Surgery: Slow post-op progression: GI, pain control, mobility  - Patient is a high risk of re-admission due to: None   Cristie Hem 08/28/2021, 7:49 AM

## 2021-08-28 NOTE — Progress Notes (Signed)
Occupational Therapy Treatment Patient Details Name: DWAN FENNEL MRN: [id number] DOB: 01/04/1951 Today's Date: 08/28/2021   History of present illness The pt is a 71 y/o female presenting 8/14 for L TKA. PMH inlcudes: arthritis, depression, DM II, hx DVT, HLD, hiatal hernia, HTN, back surgery and R TKA in 2019.   OT comments  Pt progressing towards established OT goals. With greater mobility this date and decreased pain. Pt preparing to be discharged on OT arrival, so prioritizing verbal education and demonstration of shower transfer with pt and husband as pt husband will be available to assist at home; pt reporting sponge baths for several days before attempt. Pt and husband verbalizing understanding of shower transfer within precautions. Continue to recommend following physicians orders for discharge.    Recommendations for follow up therapy are one component of a multi-disciplinary discharge planning process, led by the attending physician.  Recommendations may be updated based on patient status, additional functional criteria and insurance authorization.    Follow Up Recommendations  Follow physician's recommendations for discharge plan and follow up therapies    Assistance Recommended at Discharge Intermittent Supervision/Assistance  Patient can return home with the following  A little help with walking and/or transfers;A little help with bathing/dressing/bathroom;Help with stairs or ramp for entrance;Assist for transportation;Assistance with cooking/housework   Equipment Recommendations  BSC/3in1    Recommendations for Other Services      Precautions / Restrictions Precautions Precautions: Fall;Knee Precaution Booklet Issued: Yes (comment) Precaution Comments: Pt was educated on NO pillow/roll/ice pack under knee to promote extension. Restrictions Weight Bearing Restrictions: Yes LLE Weight Bearing: Weight bearing as tolerated       Mobility Bed Mobility Overal bed  mobility: Needs Assistance Bed Mobility: Supine to Sit     Supine to sit: Supervision     General bed mobility comments: Coming into long sit during education without assist    Transfers                         Balance Overall balance assessment: Needs assistance Sitting-balance support: No upper extremity supported, Feet supported Sitting balance-Leahy Scale: Good Sitting balance - Comments: Sitting up in bed for education                                   ADL either performed or assessed with clinical judgement   ADL Overall ADL's : Needs assistance/impaired                     Lower Body Dressing: Supervision/safety;Sit to/from stand Lower Body Dressing Details (indicate cue type and reason): Pt dressed on arrival, reporting no physical A from husband or nursing. Pt verbalizing dresing within precautions           Tub/Shower Transfer Details (indicate cue type and reason): Reviewing tub/shower transfer with pt and husband   General ADL Comments: Reviewing precautions and tub/shower transfer before discharge    Extremity/Trunk Assessment Upper Extremity Assessment Upper Extremity Assessment: Overall WFL for tasks assessed   Lower Extremity Assessment Lower Extremity Assessment: Defer to PT evaluation        Vision   Vision Assessment?: No apparent visual deficits   Perception Perception Perception: Not tested   Praxis Praxis Praxis: Not tested    Cognition Arousal/Alertness: Awake/alert Behavior During Therapy: WFL for tasks assessed/performed Overall Cognitive Status: Within Functional Limits for tasks assessed  General Comments: Pt pleasant and conversational, reporting ready to go home today. Willing to review dressing and shower transfer today, but not attempting shower transfer as pt reporting she will sponge bathe at home to begin with, thus recommending HHOT.         Exercises      Shoulder Instructions       General Comments VSS on RA. Husband present    Pertinent Vitals/ Pain       Pain Assessment Pain Assessment: 0-10 Pain Score: 7  Pain Location: L knee, whole leg Pain Descriptors / Indicators: Discomfort, Grimacing, Operative site guarding, Sore Pain Intervention(s): Limited activity within patient's tolerance  Home Living                                          Prior Functioning/Environment              Frequency  Min 2X/week        Progress Toward Goals  OT Goals(current goals can now be found in the care plan section)  Progress towards OT goals: Progressing toward goals  Acute Rehab OT Goals Patient Stated Goal: Go home OT Goal Formulation: With patient/family Time For Goal Achievement: 09/10/21 Potential to Achieve Goals: Good ADL Goals Pt Will Perform Lower Body Dressing: with modified independence;sit to/from stand Pt Will Transfer to Toilet: ambulating;with modified independence Pt Will Perform Tub/Shower Transfer: with supervision;ambulating;3 in 1;rolling walker  Plan Discharge plan remains appropriate    Co-evaluation                 AM-PAC OT "6 Clicks" Daily Activity     Outcome Measure   Help from another person eating meals?: None Help from another person taking care of personal grooming?: A Little Help from another person toileting, which includes using toliet, bedpan, or urinal?: A Little Help from another person bathing (including washing, rinsing, drying)?: A Little Help from another person to put on and taking off regular upper body clothing?: A Little Help from another person to put on and taking off regular lower body clothing?: A Little 6 Click Score: 19    End of Session    OT Visit Diagnosis: Unsteadiness on feet (R26.81);Muscle weakness (generalized) (M62.81);Other abnormalities of gait and mobility (R26.89);Pain Pain - Right/Left: Left Pain - part of  body: Knee   Activity Tolerance Patient tolerated treatment well   Patient Left in bed;with call bell/phone within reach;with family/visitor present   Nurse Communication Mobility status        Time: 8469-6295 OT Time Calculation (min): 7 min  Charges: OT General Charges $OT Visit: 1 Visit  Ladene Artist, OTR/L [facility] Acute Rehabilitation Office: [phone]   Drue Novel 08/28/2021, 10:16 AM

## 2021-08-28 NOTE — Progress Notes (Signed)
Physical Therapy Treatment Patient Details Name: Kristin Anthony MRN: [id number] DOB: 07/10/1950 Today's Date: 08/28/2021   History of Present Illness The pt is a 71 y/o female presenting 8/14 for L TKA. PMH inlcudes: arthritis, depression, DM II, hx DVT, HLD, hiatal hernia, HTN, back surgery and R TKA in 2019.    PT Comments    Pt progressing towards physical therapy goals.  Was able to perform transfers and ambulation with gross min guard assist and RW for support. Pt continues to move slowly however with increased tolerance for functional activity this session. Husband present for education as focus of session was stair training. Will continue to follow and progress as able per POC.    Recommendations for follow up therapy are one component of a multi-disciplinary discharge planning process, led by the attending physician.  Recommendations may be updated based on patient status, additional functional criteria and insurance authorization.  Follow Up Recommendations  Follow physician's recommendations for discharge plan and follow up therapies     Assistance Recommended at Discharge Frequent or constant Supervision/Assistance  Patient can return home with the following A little help with walking and/or transfers;A little help with bathing/dressing/bathroom;Assistance with cooking/housework;Assist for transportation;Help with stairs or ramp for entrance   Equipment Recommendations  BSC/3in1    Recommendations for Other Services       Precautions / Restrictions Precautions Precautions: Fall;Knee Precaution Booklet Issued: Yes (comment) Precaution Comments: Pt was educated on NO pillow/roll/ice pack under knee to promote extension. Restrictions Weight Bearing Restrictions: Yes LLE Weight Bearing: Weight bearing as tolerated     Mobility  Bed Mobility Overal bed mobility: Needs Assistance Bed Mobility: Supine to Sit     Supine to sit: Supervision     General bed mobility  comments: Increased time and effort with supervision for safety.    Transfers Overall transfer level: Needs assistance Equipment used: Rolling walker (2 wheels) Transfers: Sit to/from Stand Sit to Stand: Min guard           General transfer comment: Increased time. Close guard for safety as pt powered up to full stand. VC's for hand placement on seated surface for safety prior to initiating stand>sit.    Ambulation/Gait Ambulation/Gait assistance: Min guard Gait Distance (Feet): 75 Feet Assistive device: Rolling walker (2 wheels) Gait Pattern/deviations: Step-to pattern, Decreased stride length, Decreased weight shift to left, Step-through pattern, Trunk flexed Gait velocity: decreased Gait velocity interpretation: <1.31 ft/sec, indicative of household ambulator   General Gait Details: VC's for improved posture, increased heel strike, and forward gaze. Pt with difficulty to smoothly advance walker.   Stairs Stairs: Yes Stairs assistance: Min assist Stair Management: Two rails, Step to pattern, Forwards Number of Stairs: 6 General stair comments: VC's for sequencing and general safety.   Wheelchair Mobility    Modified Rankin (Stroke Patients Only)       Balance Overall balance assessment: Needs assistance Sitting-balance support: No upper extremity supported, Feet supported Sitting balance-Leahy Scale: Good     Standing balance support: Bilateral upper extremity supported, During functional activity, Reliant on assistive device for balance Standing balance-Leahy Scale: Poor Standing balance comment: Dynamically, requires BUE support on RW.                            Cognition Arousal/Alertness: Awake/alert Behavior During Therapy: WFL for tasks assessed/performed Overall Cognitive Status: Within Functional Limits for tasks assessed  Exercises Total Joint Exercises Heel Slides: 10 reps,  Seated Hip ABduction/ADduction: 10 reps, Seated Long Arc Quad: 10 reps Goniometric ROM: ~85    General Comments General comments (skin integrity, edema, etc.): VSS on RA. Husband present      Pertinent Vitals/Pain Pain Assessment Pain Assessment: Faces Faces Pain Scale: Hurts whole lot Pain Location: L knee, whole leg Pain Descriptors / Indicators: Discomfort, Grimacing, Operative site guarding, Sore Pain Intervention(s): Limited activity within patient's tolerance, Monitored during session, Repositioned    Home Living                          Prior Function            PT Goals (current goals can now be found in the care plan section) Acute Rehab PT Goals Patient Stated Goal: to reduce pain and return home PT Goal Formulation: With patient Time For Goal Achievement: 09/09/21 Potential to Achieve Goals: Good Progress towards PT goals: Progressing toward goals    Frequency    7X/week      PT Plan Current plan remains appropriate    Co-evaluation              AM-PAC PT "6 Clicks" Mobility   Outcome Measure  Help needed turning from your back to your side while in a flat bed without using bedrails?: A Little Help needed moving from lying on your back to sitting on the side of a flat bed without using bedrails?: A Little Help needed moving to and from a bed to a chair (including a wheelchair)?: A Little Help needed standing up from a chair using your arms (e.g., wheelchair or bedside chair)?: A Little Help needed to walk in hospital room?: A Little Help needed climbing 3-5 steps with a railing? : A Little 6 Click Score: 18    End of Session Equipment Utilized During Treatment: Gait belt Activity Tolerance: Patient limited by pain Patient left: in chair;with call bell/phone within reach;with family/visitor present Nurse Communication: Mobility status PT Visit Diagnosis: Other abnormalities of gait and mobility (R26.89);Muscle weakness  (generalized) (M62.81);Pain Pain - Right/Left: Left Pain - part of body: Knee     Time: 4315-4008 PT Time Calculation (min) (ACUTE ONLY): 29 min  Charges:  $Gait Training: 8-22 mins $Therapeutic Exercise: 8-22 mins                     Conni Slipper, PT, DPT Acute Rehabilitation Services Secure Chat Preferred Office: [phone]    Marylynn Pearson 08/28/2021, 11:01 AM

## 2021-08-28 NOTE — Progress Notes (Signed)
Patient alert and oriented, mae's well, voiding adequate amount of urine, swallowing without difficulty, no c/o pain at time of discharge. Patient discharged home with family. Script and discharged instructions given to patient. Patient and family stated understanding of instructions given. Patient has an appointment with Dr. XU    

## 2021-08-30 ENCOUNTER — Telehealth: Payer: Self-pay | Admitting: Orthopaedic Surgery

## 2021-08-30 NOTE — Telephone Encounter (Signed)
Patient called. Would like to know if she can use BioFreeze on the area not bandaged? Her call back number is [phone]

## 2021-08-30 NOTE — Telephone Encounter (Signed)
Called and relayed to patient that it was fine as long as she didn't put it on the incision.

## 2021-09-02 ENCOUNTER — Telehealth: Payer: Self-pay | Admitting: Orthopaedic Surgery

## 2021-09-02 ENCOUNTER — Other Ambulatory Visit (HOSPITAL_BASED_OUTPATIENT_CLINIC_OR_DEPARTMENT_OTHER): Payer: Self-pay

## 2021-09-02 MED ORDER — KETOROLAC TROMETHAMINE 10 MG PO TABS: 10.0000 mg | ORAL_TABLET | Freq: Two times a day (BID) | ORAL | 0 refills | Status: DC | PRN

## 2021-09-02 MED ORDER — OXYCODONE-ACETAMINOPHEN 5-325 MG PO TABS: 1.0000 | ORAL_TABLET | Freq: Two times a day (BID) | ORAL | 0 refills | Status: DC | PRN

## 2021-09-02 MED ORDER — KETOROLAC TROMETHAMINE 10 MG PO TABS
10.0000 mg | ORAL_TABLET | Freq: Two times a day (BID) | ORAL | 0 refills | Status: DC | PRN
  Filled 2021-09-02: qty 10, 5d supply, fill #0

## 2021-09-02 NOTE — Telephone Encounter (Signed)
Spoke with patient and let her know.  

## 2021-09-02 NOTE — Telephone Encounter (Signed)
I sent percocet and toradol.

## 2021-09-02 NOTE — Telephone Encounter (Signed)
Called and notified patient.

## 2021-09-02 NOTE — Telephone Encounter (Signed)
done

## 2021-09-02 NOTE — Telephone Encounter (Signed)
Patient called. She would like her pain medications called in to [facility] on [address] in [place]. The other pharmacy don't have the medication in stock. Her call back number is [phone]

## 2021-09-02 NOTE — Telephone Encounter (Signed)
Pt called and states the hydrocodone is not even touching her pain. She needs someone to call her.   Cb [phone]

## 2021-09-02 NOTE — Telephone Encounter (Signed)
Pt called requesting a new pain medication. Pt has surgrey las week. Pt states the medication that was given is not touching her pain. Please send new script to pharmacy on file. Pt phone number is [phone]

## 2021-09-02 NOTE — Telephone Encounter (Signed)
Tried to call patient. No answer. I was trying to let her know that medication was resent.

## 2021-09-06 ENCOUNTER — Telehealth: Payer: Self-pay | Admitting: *Deleted

## 2021-09-06 NOTE — Chronic Care Management (AMB) (Unsigned)
  Care Coordination  Outreach Note  09/06/2021 Name: TONEE SILVERSTEIN MRN: [id number] DOB: 06/25/1950   Care Coordination Outreach Attempts  Contact was made with the patient today to offer care coordination services as a benefit of their health plan. The patient requested a return call on a later date.   Follow Up Plan:  Additional outreach attempts will be made to offer the patient care coordination information and services.   Encounter Outcome:  Pt. Request to Call Back  [facility] Coordination Care Guide  Direct Dial: [phone]

## 2021-09-09 NOTE — Chronic Care Management (AMB) (Signed)
  Care Coordination   Note   09/09/2021 Name: SHANOAH ASBILL MRN: [id number] DOB: 04-04-1950  Cherly Anderson Mcilvain is a 71 y.o. year old female who sees Creola Corn, MD for primary care. I reached out to Idamae Schuller by phone today to offer care coordination services.  Ms. Goller was given information about Care Coordination services today including:   The Care Coordination services include support from the care team which includes your Nurse Coordinator, Clinical Social Worker, or Pharmacist.  The Care Coordination team is here to help remove barriers to the health concerns and goals most important to you. Care Coordination services are voluntary, and the patient may decline or stop services at any time by request to their care team member.   Care Coordination Consent Status: Patient agreed to services and verbal consent obtained.   Follow up plan:  Telephone appointment with care coordination team member scheduled for:  09/13/21  Encounter Outcome:  Pt. Scheduled  [facility] Coordination Care Guide  Direct Dial: [phone]

## 2021-09-10 ENCOUNTER — Ambulatory Visit (INDEPENDENT_AMBULATORY_CARE_PROVIDER_SITE_OTHER): Payer: BC Managed Care – PPO | Admitting: Physician Assistant

## 2021-09-10 DIAGNOSIS — Z96652 Presence of left artificial knee joint: Secondary | ICD-10-CM

## 2021-09-10 MED ORDER — TIZANIDINE HCL 4 MG PO TABS: 4.0000 mg | ORAL_TABLET | Freq: Three times a day (TID) | ORAL | 0 refills | Status: DC | PRN

## 2021-09-10 NOTE — Progress Notes (Signed)
Post-Op Visit Note   Patient: Kristin Anthony           Date of Birth: [birth year]           MRN: [id number] Visit Date: 09/10/2021 PCP: Creola Corn, MD   Assessment & Plan:  Chief Complaint:  Chief Complaint  Patient presents with   Left Knee - Routine Post Op   Visit Diagnoses:  1. History of total knee replacement, left     Plan: Patient is a pleasant 71 year old female who comes in today 2 weeks status post left total knee replacement 08/26/2021.  She has been doing well.  [name] never came out to her house, but she has continued to work with the CPM and has worked on home exercises provided from the hospital.  She has been in a moderate amount of pain which is relieved with oxycodone.  Her main complaint is muscle spasms for which methocarbamol minimally helps.  She denies any calf pain.  She has been compliant with Xarelto.  Examination of her left knee reveals a fully healed surgical scar with nylon sutures in place.  No evidence of infection or cellulitis.  Calf is soft and minimally tender.  Negative Homans.  She is neurovascular intact distally.  At this point, sutures were removed and Steri-Strips applied.  Continue with her Xarelto.  Continue wearing a compression sock for swelling.  I have made a referral for outpatient physical therapy in [facility] and she has been instructed to call us within the next few days if she has not heard from them.  Dental prophylaxis reinforced.  Stitches removed and Steri-Strips applied.  Follow-up in 4 weeks for repeat evaluation and 2 view x-rays of the left knee.  Call with concerns or questions at any time.  Follow-Up Instructions: Return in about 4 weeks (around 10/08/2021).   Orders:  Orders Placed This Encounter  Procedures   Ambulatory referral to Physical Therapy   Meds ordered this encounter  Medications   tiZANidine (ZANAFLEX) 4 MG tablet    Sig: Take 1 tablet (4 mg total) by mouth every 8 (eight) hours as needed for muscle  spasms.    Dispense:  30 tablet    Refill:  0    Imaging: No new imaging  PMFS History: Patient Active Problem List   Diagnosis Date Noted   Status post total left knee replacement 08/26/2021   Primary osteoarthritis of left knee 05/16/2021   Total knee replacement status 07/13/2017   Unilateral primary osteoarthritis, left knee 10/07/2016   Chronic pain of both knees 10/07/2016   Unilateral primary osteoarthritis, right knee 03/18/2016   Closed fracture of tuft of distal phalanx of right thumb 08/31/2012   Past Medical History:  Diagnosis Date   Arthritis    Depression    Diabetes mellitus    DVT (deep venous thrombosis) (HCC)    High cholesterol    Hypertension     Family History  Problem Relation Age of Onset   Heart attack Mother    Diabetes Father    Hypertension Father    Hypertension Sister    Diabetes Sister    Hypertension Brother    Diabetes Brother    Hyperlipidemia Neg Hx    Sudden death Neg Hx     Past Surgical History:  Procedure Laterality Date   ABDOMINAL HYSTERECTOMY     partial   APPENDECTOMY     BACK SURGERY     KNEE SURGERY     TOTAL  KNEE ARTHROPLASTY Right 07/13/2017   TOTAL KNEE ARTHROPLASTY Right 07/13/2017   Procedure: RIGHT TOTAL KNEE ARTHROPLASTY;  Surgeon: Tarry Kos, MD;  Location: [facility] OR;  Service: Orthopedics;  Laterality: Right;   TOTAL KNEE ARTHROPLASTY Left 08/26/2021   Procedure: LEFT TOTAL KNEE ARTHROPLASTY;  Surgeon: Tarry Kos, MD;  Location: [facility] OR;  Service: Orthopedics;  Laterality: Left;   Social History   Occupational History   Not on file  Tobacco Use   Smoking status: Never   Smokeless tobacco: Never  Vaping Use   Vaping Use: Never used  Substance and Sexual Activity   Alcohol use: No   Drug use: No   Sexual activity: Not Currently

## 2021-09-11 ENCOUNTER — Telehealth: Payer: Self-pay

## 2021-09-11 NOTE — Telephone Encounter (Signed)
Patient called stating that she is still having some warmness to her left knee and wanted to know if this is normal?  Stated that she is using ice.  Cb# [phone].  Please advise.  Thank you.

## 2021-09-11 NOTE — Telephone Encounter (Signed)
Just warmth is normal.  As long as there's not any other symptoms.

## 2021-09-12 ENCOUNTER — Telehealth: Payer: Self-pay | Admitting: Radiology

## 2021-09-12 MED ORDER — KETOROLAC TROMETHAMINE 10 MG PO TABS: 10.0000 mg | ORAL_TABLET | Freq: Two times a day (BID) | ORAL | 0 refills | Status: AC | PRN

## 2021-09-12 NOTE — Therapy (Signed)
OUTPATIENT PHYSICAL THERAPY LOWER EXTREMITY EVALUATION   Patient Name: Kristin Anthony MRN: [id number] DOB:09-06-1950, 71 y.o., female Today's Date: 09/18/2021    PT End of Session - 09/18/21 0803     Visit Number 1    Date for PT Re-Evaluation 11/13/21    Authorization Type BC[employer] - VL: 30    PT Start Time 0803    PT Stop Time 0851    PT Time Calculation (min) 48 min    Activity Tolerance Patient tolerated treatment well    Behavior During Therapy WFL for tasks assessed/performed             Past Medical History:  Diagnosis Date   Arthritis    Depression    Diabetes mellitus    DVT (deep venous thrombosis) (HCC)    High cholesterol    Hypertension    Past Surgical History:  Procedure Laterality Date   ABDOMINAL HYSTERECTOMY     partial   APPENDECTOMY     BACK SURGERY     KNEE SURGERY     TOTAL KNEE ARTHROPLASTY Right 07/13/2017   TOTAL KNEE ARTHROPLASTY Right 07/13/2017   Procedure: RIGHT TOTAL KNEE ARTHROPLASTY;  Surgeon: Tarry Kos, MD;  Location: [facility] OR;  Service: Orthopedics;  Laterality: Right;   TOTAL KNEE ARTHROPLASTY Left 08/26/2021   Procedure: LEFT TOTAL KNEE ARTHROPLASTY;  Surgeon: Tarry Kos, MD;  Location: [facility] OR;  Service: Orthopedics;  Laterality: Left;   Patient Active Problem List   Diagnosis Date Noted   Status post total left knee replacement 08/26/2021   Primary osteoarthritis of left knee 05/16/2021   Total knee replacement status 07/13/2017   Unilateral primary osteoarthritis, left knee 10/07/2016   Chronic pain of both knees 10/07/2016   Unilateral primary osteoarthritis, right knee 03/18/2016   Closed fracture of tuft of distal phalanx of right thumb 08/31/2012    PCP: Creola Corn, MD  REFERRING PROVIDER: Cristie Hem, PA-C  REFERRING DIAG: [phone] (ICD-10-CM) - History of total knee replacement, left  THERAPY DIAG:  Acute pain of left knee  Stiffness of left knee, not elsewhere classified  Other abnormalities of gait  and mobility  Difficulty in walking, not elsewhere classified  Muscle weakness (generalized)  Localized edema  Other symptoms and signs involving the musculoskeletal system  RATIONALE FOR EVALUATION AND TREATMENT: Rehabilitation  ONSET DATE: 08/26/21 - L TKA  NEXT MD VISIT: 10/09/21   SUBJECTIVE:   SUBJECTIVE STATEMENT: Pt reports she only used the RW for ~2 days after surgery and used the cane for only 1 day, but still walks with a pronounced limp. Pain interferes with sleep at night. Still uses a CPM 2-3x/day for ~15 minutes. Pt reports she was supposed to have HH PT from [facility] but no one came so she has been doing her hospital prescribed HEP on her own.  PAIN:  Are you having pain? Yes: NPRS scale:  7/10 Pain location: L anterior knee Pain description: stiff, tight with occasional muscle spasms Aggravating factors: laying down in bed, walking (esp when first getting up) Relieving factors: pain meds, ice  PERTINENT HISTORY: L TKA 08/26/21; R TKA 07/13/17; back surgery; OA, DM; DVT; HTN; HLD; depression  PRECAUTIONS: None  WEIGHT BEARING RESTRICTIONS: No  FALLS:  Has patient fallen in last 6 months? No  LIVING ENVIRONMENT: Lives with: lives with their family and lives with their spouse Lives in: House/apartment Stairs: Yes: Internal: 6 steps; on right going up, on left going up, and can reach both  and External: 3 steps; on left going up Has following equipment at home: Single point cane and Walker - 2 wheeled  OCCUPATION: Disabled since 2009  PLOF: Independent and Leisure: mostly sedentary, reading, church  PATIENT GOALS: "To really bend this knee and stand up/walk taller w/o pain."   OBJECTIVE:   DIAGNOSTIC FINDINGS:  N/A  PATIENT SURVEYS:  LEFS 20/80 = 25.0%; 75.0% disability  COGNITION: Overall cognitive status: Within functional limits for tasks assessed     SENSATION: Numbness in B feet  EDEMA:  Mild/mod L knee and distal LE edema  MUSCLE  LENGTH: Hamstrings: mild/mod tight L ITB: mild/mod tight L Piriformis: NT Hip flexors: mod tight L Quads: mod tight L Heelcord: NT  POSTURE:  rounded shoulders, forward head, flexed trunk , and L knee flexed in stance  PALPATION: Patellar mobility: restricted all directions Increased muscle tension and TTP in L quads and HS  LOWER EXTREMITY ROM:  Active ROM Right eval Left eval  Hip flexion    Hip extension    Hip abduction    Hip adduction    Hip internal rotation    Hip external rotation    Knee flexion 101 79 seated 81 supine  Knee extension 1 16 seated 18 supine  Ankle dorsiflexion    Ankle plantarflexion    Ankle inversion    Ankle eversion     Passive ROM Right eval Left eval  Knee flexion    Knee extension  14   (Blank rows = not tested)  LOWER EXTREMITY MMT: (tested in sitting on eval as pt unable to lay down for more than 30 sec at a time)  MMT Right eval Left eval  Hip flexion 4+ 4  Hip extension 4 4  Hip abduction 4+ 4-  Hip adduction 4 4-  Hip internal rotation    Hip external rotation    Knee flexion 4 4-  Knee extension 4+ 4-  Ankle dorsiflexion 4 4-  Ankle plantarflexion    Ankle inversion    Ankle eversion     (Blank rows = not tested)  GAIT: Distance walked: 80 Assistive device utilized: Single point cane and None Level of assistance: Modified independence Gait pattern: step to pattern, decreased step length- Right, decreased stance time- Left, decreased stride length, decreased hip/knee flexion- Left, knee flexed in stance- Left, and antalgic Comments: decreased gait speed with absent heel strike and limited heel-toe progression   TODAY'S TREATMENT:  09/18/21 THERAPEUTIC EXERCISE: Instruction in initial HEP (see below) to improve flexibility, strength and mobility.  Verbal and tactile cues throughout for technique.  GAIT TRAINING: To normalize gait pattern and improve safety with SPC . 29 ft with SPC - cues for sequencing of  SPC on R in time with L foot as well as for heel strike to promote knee extension ROM and heel-toe progression with limb advancement    PATIENT EDUCATION:  Education details: PT eval findings, anticipated POC, progress with PT, and gait safety with SPC Person educated: Patient Education method: Explanation, Demonstration, Verbal cues, and Handouts Education comprehension: verbalized understanding, returned demonstration, verbal cues required, and needs further education   HOME EXERCISE PROGRAM: Access Code: [email or web address] URL: [email or web address] Date: 09/18/2021 Prepared by: Annie Paras  Exercises - Seated Hamstring Stretch with Strap  - 2-3 x daily - 7 x weekly - 3 reps - 30 sec hold - Seated Knee Flexion AAROM  - 2-3 x daily - 7 x weekly - 2 sets - 10 reps - 3 sec hold -  Seated Long Arc Quad  - 2-3 x daily - 7 x weekly - 2 sets - 10 reps - 3 sec hold - Seated Passive Knee Extension  - 3 x daily - 7 x weekly - 3 reps - 30-60 sec hold  ASSESSMENT:  CLINICAL IMPRESSION: Kristin Anthony is a 71 y.o. female who was seen today for physical therapy evaluation and treatment s/p L TKA on 08/26/21. She was supposed to have received HH PT but no one came so she has been performing the hospital issued HEP and using the CPM 15 min 2-3 x/day on her own. She self weaned from all ADs within the first few days post-op but walks with a pronounced limp, lacking good heel strike or knee extension in stance. Deficits include L knee pain, limited and painful L knee A/PROM, decreased L patellar mobility, limited proximal LE flexibility with increased muscle tension and muscle spasms, L>R LE weakness and abnormal gait. Deficits contribute to limited positional and activity tolerance and as well as impaired mobility and ADLs in home. Mistie will benefit  from skilled PT intervention to address the above listed deficits, reduce pain, and restore functional L knee ROM and strength to allow for improved  knee stability for improved balance and gait tolerance to maximize function and safety with mobility in home and community. Reviewed recommendations for positioning of knee to reduce risk of knee flexion contracture as well as ice, elevation and hospital issued HEP exercises to promote knee ROM and reduce post-op edema as well as risk for DVT, adding stretches and AAROM to encourage increased ROM.  OBJECTIVE IMPAIRMENTS: Abnormal gait, decreased activity tolerance, decreased balance, decreased endurance, decreased knowledge of condition, decreased knowledge of use of DME, decreased mobility, difficulty walking, decreased ROM, decreased strength, decreased safety awareness, increased edema, increased fascial restrictions, impaired perceived functional ability, increased muscle spasms, impaired flexibility, improper body mechanics, postural dysfunction, and pain.   ACTIVITY LIMITATIONS: carrying, lifting, bending, sitting, standing, squatting, sleeping, stairs, transfers, bed mobility, bathing, toileting, dressing, and locomotion level  PARTICIPATION LIMITATIONS: meal prep, cleaning, laundry, driving, shopping, community activity, yard work, and school  PERSONAL FACTORS: Age, Fitness, Past/current experiences, Time since onset of injury/illness/exacerbation, and 3+ comorbidities: L TKA 08/26/21; R TKA 07/13/17; back surgery; OA, DM; DVT; HTN; HLD; depression  are also affecting patient's functional outcome.   REHAB POTENTIAL: Good  CLINICAL DECISION MAKING: Stable/uncomplicated  EVALUATION COMPLEXITY: Low   GOALS: Goals reviewed with patient? Yes  SHORT TERM GOALS: Target date: 10/09/2021   Patient will be independent with initial HEP. Baseline:  Goal status: INITIAL  2.  Patient will demonstrate improved L knee AROM to >/= 10-90 deg to improve gait pattern. Baseline: L knee AROM 16-81 deg Goal status: INITIAL  LONG TERM GOALS: Target date: 11/13/2021   Patient will be independent with  advanced/ongoing HEP to improve outcomes and carryover.  Baseline:  Goal status: INITIAL  2.  Patient will report at least 75% improvement in L knee pain to improve QOL. Baseline: 7/10 Goal status: INITIAL  3.  Patient will demonstrate improved L knee AROM to >/= 3-110 deg to allow for normal gait and stair mechanics. Baseline: L knee AROM 16-81 deg Goal status: INITIAL  4.  Patient will demonstrate improved B LE strength to >/= 4+/5 for improved stability and ease of mobility. Baseline: Refer to above MMT table Goal status: INITIAL  5.  Patient will be able to ambulate 600' with or w/o LRAD and normal gait pattern without increased  pain to access community.  Baseline:  Goal status: INITIAL  6. Patient will be able to ascend/descend stairs with 1 HR and reciprocal step pattern safely to access home and community.  Baseline:  Goal status: INITIAL  7.  Patient will report >/= 30/80 on LEFS to demonstrate improved functional ability. Baseline: 20/80 = 25.0% (75.0% disability) Goal status: INITIAL  8.  Patient will demonstrate at least 19/24 on DGI to decrease risk of falls. Baseline: NT Goal status: INITIAL    PLAN: PT FREQUENCY: 2x/week  PT DURATION: 6-8 weeks  PLANNED INTERVENTIONS: Therapeutic exercises, Therapeutic activity, Neuromuscular re-education, Balance training, Gait training, Patient/Family education, Self Care, Joint mobilization, Stair training, DME instructions, Dry Needling, Electrical stimulation, Cryotherapy, Moist heat, scar mobilization, Taping, Vasopneumatic device, Ionotophoresis 4mg /ml Dexamethasone, Manual therapy, and Re-evaluation  PLAN FOR NEXT SESSION: Gait training with SPC to reduce antalgic gait and promote nomral gait pattern before weaning from AD; L knee ROM; stretching and MT to reduce muscle tension/spasm; patellar mobility; LE strengthening; HEP update as indicated   Percival Spanish, PT 09/18/2021, 1:58 PM

## 2021-09-12 NOTE — Addendum Note (Signed)
Addended by: Mayra Reel on: 09/12/2021 02:54 PM   Modules accepted: Orders

## 2021-09-12 NOTE — Telephone Encounter (Signed)
Tried to call and notify patient. No answer. No voicemail.

## 2021-09-12 NOTE — Telephone Encounter (Signed)
Done

## 2021-09-13 ENCOUNTER — Ambulatory Visit: Payer: Self-pay

## 2021-09-13 NOTE — Patient Instructions (Signed)
Visit Information  Thank you for taking time to visit with me today. Please don't hesitate to contact me if I can be of assistance to you.   Following are the goals we discussed today:   Goals Addressed             This Visit's Progress    Care Coordination Activities       Care Coordination Interventions: SDoH screening performed - no acute resource challenges identified at this time Determined the patient does not have concerns with medication costs and/or adherence at this time Discussed the patient recently underwent a knee replacement - spouse is in the home to assist with recovery needs Education provided on the role of the care coordination team - no follow up desired at this time Instructed the patient to contact her primary care provider as needed        Please call the care guide team at [phone] if you need to schedule an appointment with our care coordination team  If you are experiencing a Mental Health or Behavioral Health Crisis or need someone to talk to, please call 1-800-273-TALK (toll free, 24 hour hotline)  The patient verbalized understanding of instructions, educational materials, and care plan provided today and DECLINED offer to receive copy of patient instructions, educational materials, and care plan.   No further follow up required: Please contact your primary care provider as needed  Bevelyn Ngo, BSW, CDP Social Worker, Certified Dementia Practitioner Care Coordination [phone]

## 2021-09-13 NOTE — Patient Outreach (Signed)
  Care Coordination   Initial Visit Note   09/13/2021 Name: VEATRICE ECKSTEIN MRN: [id number] DOB: 1950-07-29  Cherly Anderson Langsam is a 71 y.o. year old female who sees Creola Corn, MD for primary care. I spoke with  Idamae Schuller by phone today.  What matters to the patients health and wellness today?  To recover from knee replacement surgery    Goals Addressed             This Visit's Progress    Care Coordination Activities       Care Coordination Interventions: SDoH screening performed - no acute resource challenges identified at this time Determined the patient does not have concerns with medication costs and/or adherence at this time Discussed the patient recently underwent a knee replacement - spouse is in the home to assist with recovery needs Education provided on the role of the care coordination team - no follow up desired at this time Instructed the patient to contact her primary care provider as needed        SDOH assessments and interventions completed:  Yes  SDOH Interventions Today    Flowsheet Row Most Recent Value  SDOH Interventions   Food Insecurity Interventions Intervention Not Indicated  Housing Interventions Intervention Not Indicated  Transportation Interventions Intervention Not Indicated        Care Coordination Interventions Activated:  Yes  Care Coordination Interventions:  Yes, provided   Follow up plan: No further intervention required.   Encounter Outcome:  Pt. Visit Completed   Bevelyn Ngo, BSW, CDP Social Worker, Certified Dementia Practitioner Care Coordination [phone]

## 2021-09-18 ENCOUNTER — Telehealth: Payer: Self-pay | Admitting: Orthopaedic Surgery

## 2021-09-18 ENCOUNTER — Other Ambulatory Visit: Payer: Self-pay | Admitting: Physician Assistant

## 2021-09-18 ENCOUNTER — Telehealth: Payer: Self-pay

## 2021-09-18 ENCOUNTER — Ambulatory Visit: Payer: BC Managed Care – PPO | Attending: Physician Assistant | Admitting: Physical Therapy

## 2021-09-18 ENCOUNTER — Other Ambulatory Visit: Payer: Self-pay

## 2021-09-18 ENCOUNTER — Encounter: Payer: Self-pay | Admitting: Physical Therapy

## 2021-09-18 DIAGNOSIS — M25662 Stiffness of left knee, not elsewhere classified: Secondary | ICD-10-CM | POA: Insufficient documentation

## 2021-09-18 DIAGNOSIS — M6281 Muscle weakness (generalized): Secondary | ICD-10-CM | POA: Insufficient documentation

## 2021-09-18 DIAGNOSIS — Z96652 Presence of left artificial knee joint: Secondary | ICD-10-CM | POA: Insufficient documentation

## 2021-09-18 DIAGNOSIS — R29898 Other symptoms and signs involving the musculoskeletal system: Secondary | ICD-10-CM | POA: Diagnosis not present

## 2021-09-18 DIAGNOSIS — R2689 Other abnormalities of gait and mobility: Secondary | ICD-10-CM | POA: Insufficient documentation

## 2021-09-18 DIAGNOSIS — R600 Localized edema: Secondary | ICD-10-CM | POA: Insufficient documentation

## 2021-09-18 DIAGNOSIS — M25562 Pain in left knee: Secondary | ICD-10-CM | POA: Diagnosis not present

## 2021-09-18 DIAGNOSIS — R262 Difficulty in walking, not elsewhere classified: Secondary | ICD-10-CM | POA: Diagnosis not present

## 2021-09-18 MED ORDER — TIZANIDINE HCL 4 MG PO TABS: 4.0000 mg | ORAL_TABLET | Freq: Two times a day (BID) | ORAL | 0 refills | Status: DC | PRN

## 2021-09-18 NOTE — Telephone Encounter (Signed)
Patient came into the office stating that she is having some burning in her left knee and some numbness and tingling in both feet, but she can wiggle her toes.  Having some spasms in her left knee cap.  Would like to know if this is normal? Would like a RX refill on Tizanidine sent to her pharmacy. CB# [phone].  Please advise.  Thank you.

## 2021-09-18 NOTE — Telephone Encounter (Signed)
Faxed note to [facility] [phone]

## 2021-09-18 NOTE — Telephone Encounter (Signed)
Normal to have some numbness to the lateral knee from surgery, but not into the feet.  Sounds like this could possibly be coming from her back.

## 2021-09-18 NOTE — Telephone Encounter (Signed)
Yes approve

## 2021-09-18 NOTE — Telephone Encounter (Signed)
Pt called requesting a letter of extension for her caregiver which is her husband to be out of work another 2 weeks to care for pt. Pt states she is still in pains and still unable to drive and still need help around. Please call pt when ready for pick up. Pt phone number is [phone].

## 2021-09-20 ENCOUNTER — Ambulatory Visit: Payer: BC Managed Care – PPO

## 2021-09-20 DIAGNOSIS — M25562 Pain in left knee: Secondary | ICD-10-CM

## 2021-09-20 DIAGNOSIS — M6281 Muscle weakness (generalized): Secondary | ICD-10-CM

## 2021-09-20 DIAGNOSIS — R29898 Other symptoms and signs involving the musculoskeletal system: Secondary | ICD-10-CM

## 2021-09-20 DIAGNOSIS — R262 Difficulty in walking, not elsewhere classified: Secondary | ICD-10-CM

## 2021-09-20 DIAGNOSIS — R600 Localized edema: Secondary | ICD-10-CM | POA: Diagnosis not present

## 2021-09-20 DIAGNOSIS — M25662 Stiffness of left knee, not elsewhere classified: Secondary | ICD-10-CM | POA: Diagnosis not present

## 2021-09-20 DIAGNOSIS — R2689 Other abnormalities of gait and mobility: Secondary | ICD-10-CM | POA: Diagnosis not present

## 2021-09-20 DIAGNOSIS — Z96652 Presence of left artificial knee joint: Secondary | ICD-10-CM | POA: Diagnosis not present

## 2021-09-20 NOTE — Therapy (Signed)
OUTPATIENT PHYSICAL THERAPY TREATMENT   Patient Name: Kristin Anthony MRN: [id number] DOB:06-14-1950, 71 y.o., female Today's Date: 09/20/2021    PT End of Session - 09/20/21 0845     Visit Number 2    Date for PT Re-Evaluation 11/13/21    Authorization Type [employer] - VL: 30    PT Start Time 0800    PT Stop Time 0840    PT Time Calculation (min) 40 min    Activity Tolerance Patient tolerated treatment well    Behavior During Therapy WFL for tasks assessed/performed              Past Medical History:  Diagnosis Date   Arthritis    Depression    Diabetes mellitus    DVT (deep venous thrombosis) (HCC)    High cholesterol    Hypertension    Past Surgical History:  Procedure Laterality Date   ABDOMINAL HYSTERECTOMY     partial   APPENDECTOMY     BACK SURGERY     KNEE SURGERY     TOTAL KNEE ARTHROPLASTY Right 07/13/2017   TOTAL KNEE ARTHROPLASTY Right 07/13/2017   Procedure: RIGHT TOTAL KNEE ARTHROPLASTY;  Surgeon: Tarry Kos, MD;  Location: [facility] OR;  Service: Orthopedics;  Laterality: Right;   TOTAL KNEE ARTHROPLASTY Left 08/26/2021   Procedure: LEFT TOTAL KNEE ARTHROPLASTY;  Surgeon: Tarry Kos, MD;  Location: [facility] OR;  Service: Orthopedics;  Laterality: Left;   Patient Active Problem List   Diagnosis Date Noted   Status post total left knee replacement 08/26/2021   Primary osteoarthritis of left knee 05/16/2021   Total knee replacement status 07/13/2017   Unilateral primary osteoarthritis, left knee 10/07/2016   Chronic pain of both knees 10/07/2016   Unilateral primary osteoarthritis, right knee 03/18/2016   Closed fracture of tuft of distal phalanx of right thumb 08/31/2012    PCP: Creola Corn, MD  REFERRING PROVIDER: Ivin Poot  REFERRING DIAG: [id number] (ICD-10-CM) - History of total knee replacement, left  THERAPY DIAG:  Acute pain of left knee  Stiffness of left knee, not elsewhere classified  Other abnormalities of gait and  mobility  Difficulty in walking, not elsewhere classified  Muscle weakness (generalized)  Localized edema  Other symptoms and signs involving the musculoskeletal system  RATIONALE FOR EVALUATION AND TREATMENT: Rehabilitation  ONSET DATE: 08/26/21 - L TKA  NEXT MD VISIT: 10/09/21   SUBJECTIVE:   SUBJECTIVE STATEMENT: Pt reports that pain is ok this morning, she reports that she took pain meds before she came.   PAIN:  Are you having pain? Yes: NPRS scale: 5/10 Pain location: L anterior knee Pain description: stiff, tight with occasional muscle spasms Aggravating factors: laying down in bed, walking (esp when first getting up) Relieving factors: pain meds, ice  PERTINENT HISTORY: L TKA 08/26/21; R TKA 07/13/17; back surgery; OA, DM; DVT; HTN; HLD; depression  PRECAUTIONS: None  WEIGHT BEARING RESTRICTIONS: No  FALLS:  Has patient fallen in last 6 months? No  LIVING ENVIRONMENT: Lives with: lives with their family and lives with their spouse Lives in: House/apartment Stairs: Yes: Internal: 6 steps; on right going up, on left going up, and can reach both and External: 3 steps; on left going up Has following equipment at home: Single point cane and Walker - 2 wheeled  OCCUPATION: Disabled since 2009  PLOF: Independent and Leisure: mostly sedentary, reading, church  PATIENT GOALS: "To really bend this knee and stand up/walk taller w/o pain."  OBJECTIVE:   DIAGNOSTIC FINDINGS:  N/A  PATIENT SURVEYS:  LEFS 20/80 = 25.0%; 75.0% disability  COGNITION: Overall cognitive status: Within functional limits for tasks assessed     SENSATION: Numbness in B feet  EDEMA:  Mild/mod L knee and distal LE edema  MUSCLE LENGTH: Hamstrings: mild/mod tight L ITB: mild/mod tight L Piriformis: NT Hip flexors: mod tight L Quads: mod tight L Heelcord: NT  POSTURE:  rounded shoulders, forward head, flexed trunk , and L knee flexed in stance  PALPATION: Patellar mobility:  restricted all directions Increased muscle tension and TTP in L quads and HS  LOWER EXTREMITY ROM:  Active ROM Right eval Left eval  Hip flexion    Hip extension    Hip abduction    Hip adduction    Hip internal rotation    Hip external rotation    Knee flexion 101 79 seated 81 supine  Knee extension 1 16 seated 18 supine  Ankle dorsiflexion    Ankle plantarflexion    Ankle inversion    Ankle eversion     Passive ROM Right eval Left eval  Knee flexion    Knee extension  14   (Blank rows = not tested)  LOWER EXTREMITY MMT: (tested in sitting on eval as pt unable to lay down for more than 30 sec at a time)  MMT Right eval Left eval  Hip flexion 4+ 4  Hip extension 4 4  Hip abduction 4+ 4-  Hip adduction 4 4-  Hip internal rotation    Hip external rotation    Knee flexion 4 4-  Knee extension 4+ 4-  Ankle dorsiflexion 4 4-  Ankle plantarflexion    Ankle inversion    Ankle eversion     (Blank rows = not tested)  GAIT: Distance walked: 80 Assistive device utilized: Single point cane and None Level of assistance: Modified independence Gait pattern: step to pattern, decreased step length- Right, decreased stance time- Left, decreased stride length, decreased hip/knee flexion- Left, knee flexed in stance- Left, and antalgic Comments: decreased gait speed with absent heel strike and limited heel-toe progression   TODAY'S TREATMENT: 09/20/21 Therapeutic Exercise: Nustep L2x69min Seated knee flexion AAROM (one leg in front of other) x 10 Seated LAQ x 10 LLE Seated hamstring + gastroc stretch with strap x 30 sec Sit to stand x 10 - pt had tendency to have LLE slightly forward  Gait Training: SPC 90 ft - good heel-toe progression, slow gait velocity, cues to restore proper gait sequence with cane  Manual Therapy:  Gentle PROM to L knee flexion + ext - pt requested to end session after 2 reps of knee ext PROM  09/18/21 THERAPEUTIC EXERCISE: Instruction in initial  HEP (see below) to improve flexibility, strength and mobility.  Verbal and tactile cues throughout for technique.  GAIT TRAINING: To normalize gait pattern and improve safety with SPC . 80 ft with SPC - cues for sequencing of SPC on R in time with L foot as well as for heel strike to promote knee extension ROM and heel-toe progression with limb advancement    PATIENT EDUCATION:  Education details: PT eval findings, anticipated POC, progress with PT, and gait safety with SPC Person educated: Patient Education method: Explanation, Demonstration, Verbal cues, and Handouts Education comprehension: verbalized understanding, returned demonstration, verbal cues required, and needs further education   HOME EXERCISE PROGRAM: Access Code: [email or web address] URL: [email or web address] Date: 09/18/2021 Prepared by: Glenetta Hew  Exercises - Seated Hamstring Stretch with  Strap  - 2-3 x daily - 7 x weekly - 3 reps - 30 sec hold - Seated Knee Flexion AAROM  - 2-3 x daily - 7 x weekly - 2 sets - 10 reps - 3 sec hold - Seated Long Arc Quad  - 2-3 x daily - 7 x weekly - 2 sets - 10 reps - 3 sec hold - Seated Passive Knee Extension  - 3 x daily - 7 x weekly - 3 reps - 30-60 sec hold  ASSESSMENT:  CLINICAL IMPRESSION: Pt demonstrated a fair response to treatment. She required cues to correct sequence with SPC, at times she would loose correct foot pattern. Reviewed HEP, pt required more instruction with seated AAROM knee flexion exercise. Tried gentle PROM to knee, flexion was fine but after extension pt requested to end session. Denied modalities today.  OBJECTIVE IMPAIRMENTS: Abnormal gait, decreased activity tolerance, decreased balance, decreased endurance, decreased knowledge of condition, decreased knowledge of use of DME, decreased mobility, difficulty walking, decreased ROM, decreased strength, decreased safety awareness, increased edema, increased fascial restrictions, impaired perceived  functional ability, increased muscle spasms, impaired flexibility, improper body mechanics, postural dysfunction, and pain.   ACTIVITY LIMITATIONS: carrying, lifting, bending, sitting, standing, squatting, sleeping, stairs, transfers, bed mobility, bathing, toileting, dressing, and locomotion level  PARTICIPATION LIMITATIONS: meal prep, cleaning, laundry, driving, shopping, community activity, yard work, and school  PERSONAL FACTORS: Age, Fitness, Past/current experiences, Time since onset of injury/illness/exacerbation, and 3+ comorbidities: L TKA 08/26/21; R TKA 07/13/17; back surgery; OA, DM; DVT; HTN; HLD; depression  are also affecting patient's functional outcome.   REHAB POTENTIAL: Good  CLINICAL DECISION MAKING: Stable/uncomplicated  EVALUATION COMPLEXITY: Low   GOALS: Goals reviewed with patient? Yes  SHORT TERM GOALS: Target date: 10/09/2021   Patient will be independent with initial HEP. Baseline:  Goal status: IN PROGRESS  2.  Patient will demonstrate improved L knee AROM to >/= 10-90 deg to improve gait pattern. Baseline: L knee AROM 16-81 deg Goal status: IN PROGRESS  LONG TERM GOALS: Target date: 11/13/2021   Patient will be independent with advanced/ongoing HEP to improve outcomes and carryover.  Baseline:  Goal status: IN PROGRESS  2.  Patient will report at least 75% improvement in L knee pain to improve QOL. Baseline: 7/10 Goal status: IN PROGRESS  3.  Patient will demonstrate improved L knee AROM to >/= 3-110 deg to allow for normal gait and stair mechanics. Baseline: L knee AROM 16-81 deg Goal status: IN PROGRESS  4.  Patient will demonstrate improved B LE strength to >/= 4+/5 for improved stability and ease of mobility. Baseline: Refer to above MMT table Goal status: IN PROGRESS  5.  Patient will be able to ambulate 600' with or w/o LRAD and normal gait pattern without increased pain to access community.  Baseline:  Goal status: IN PROGRESS  6.  Patient will be able to ascend/descend stairs with 1 HR and reciprocal step pattern safely to access home and community.  Baseline:  Goal status: IN PROGRESS  7.  Patient will report >/= 30/80 on LEFS to demonstrate improved functional ability. Baseline: 20/80 = 25.0% (75.0% disability) Goal status: IN PROGRESS  8.  Patient will demonstrate at least 19/24 on DGI to decrease risk of falls. Baseline: NT Goal status: IN PROGRESS    PLAN: PT FREQUENCY: 2x/week  PT DURATION: 6-8 weeks  PLANNED INTERVENTIONS: Therapeutic exercises, Therapeutic activity, Neuromuscular re-education, Balance training, Gait training, Patient/Family education, Self Care, Joint mobilization, Stair training, DME instructions,  Dry Needling, Electrical stimulation, Cryotherapy, Moist heat, scar mobilization, Taping, Vasopneumatic device, Ionotophoresis 4mg /ml Dexamethasone, Manual therapy, and Re-evaluation  PLAN FOR NEXT SESSION: Gait training with SPC to reduce antalgic gait and promote nomral gait pattern before weaning from AD; L knee ROM; stretching and MT to reduce muscle tension/spasm; patellar mobility; LE strengthening; HEP update as indicated   , PTA 09/20/2021, 8:45 AM

## 2021-09-24 ENCOUNTER — Encounter: Payer: BC Managed Care – PPO | Admitting: Physical Therapy

## 2021-09-25 ENCOUNTER — Ambulatory Visit: Payer: BC Managed Care – PPO | Admitting: Physical Therapy

## 2021-09-26 ENCOUNTER — Ambulatory Visit (INDEPENDENT_AMBULATORY_CARE_PROVIDER_SITE_OTHER): Payer: BC Managed Care – PPO

## 2021-09-26 ENCOUNTER — Ambulatory Visit (HOSPITAL_COMMUNITY)
Admission: RE | Admit: 2021-09-26 | Discharge: 2021-09-26 | Disposition: A | Discharge status: Home / Self Care | Payer: BC Managed Care – PPO | Source: Ambulatory Visit | Attending: Orthopaedic Surgery | Admitting: Orthopaedic Surgery

## 2021-09-26 ENCOUNTER — Ambulatory Visit: Payer: BC Managed Care – PPO | Admitting: Orthopaedic Surgery

## 2021-09-26 ENCOUNTER — Encounter: Payer: Self-pay | Admitting: Orthopaedic Surgery

## 2021-09-26 DIAGNOSIS — Z96652 Presence of left artificial knee joint: Secondary | ICD-10-CM | POA: Diagnosis not present

## 2021-09-26 MED ORDER — OXYCODONE-ACETAMINOPHEN 5-325 MG PO TABS: 1.0000 | ORAL_TABLET | Freq: Two times a day (BID) | ORAL | 0 refills | Status: AC | PRN

## 2021-09-26 MED ORDER — TIZANIDINE HCL 4 MG PO TABS: 4.0000 mg | ORAL_TABLET | Freq: Two times a day (BID) | ORAL | 0 refills | Status: AC | PRN

## 2021-09-26 NOTE — Progress Notes (Signed)
Lower extremity venous has been completed.   Preliminary results in CV Proc.   Kristin Anthony Josep Luviano 09/26/2021 10:39 AM

## 2021-09-26 NOTE — Progress Notes (Signed)
Post-Op Visit Note   Patient: Kristin Anthony           Date of Birth: 13-Aug-1950           MRN: [id number] Visit Date: 09/26/2021 PCP: Creola Corn, MD   Assessment & Plan:  Chief Complaint:  Chief Complaint  Patient presents with   Left Knee - Follow-up    Left total knee arthroplasty 08/26/2021   Visit Diagnoses:  1. History of total knee replacement, left     Plan: [name] is 4 weeks status post left total knee on 08/26/2021.  She is complaining of muscle spasms.  She was requesting refill on tizanidine and oxycodone.  Examination of the left knee shows healed surgical incision.  No signs of infection.  She has about 10 to 15 degree flexion contracture and 90 degrees of flexion.  Calf is mildly sore to palpation.  X-rays show stable implant without any complications.  [name] is on Xarelto but due to history of DVT I would like to get a Doppler to rule out DVT.  Oxycodone and tizanidine both refilled.  She will focus on getting better knee extension.  Continue outpatient PT.  Recheck in 6 weeks.  Follow-Up Instructions: Return in about 6 weeks (around 11/07/2021).   Orders:  Orders Placed This Encounter  Procedures   XR Knee 1-2 Views Left   Meds ordered this encounter  Medications   tiZANidine (ZANAFLEX) 4 MG tablet    Sig: Take 1 tablet (4 mg total) by mouth 2 (two) times daily as needed for muscle spasms.    Dispense:  60 tablet    Refill:  0   oxyCODONE-acetaminophen (PERCOCET) 5-325 MG tablet    Sig: Take 1-2 tablets by mouth 2 (two) times daily as needed for severe pain.    Dispense:  30 tablet    Refill:  0    Imaging: XR Knee 1-2 Views Left  Result Date: 09/26/2021 Stable total knee replacement in good alignment.    PMFS History: Patient Active Problem List   Diagnosis Date Noted   Status post total left knee replacement 08/26/2021   Primary osteoarthritis of left knee 05/16/2021   Total knee replacement status 07/13/2017   Unilateral primary  osteoarthritis, left knee 10/07/2016   Chronic pain of both knees 10/07/2016   Unilateral primary osteoarthritis, right knee 03/18/2016   Closed fracture of tuft of distal phalanx of right thumb 08/31/2012   Past Medical History:  Diagnosis Date   Arthritis    Depression    Diabetes mellitus    DVT (deep venous thrombosis) (HCC)    High cholesterol    Hypertension     Family History  Problem Relation Age of Onset   Heart attack Mother    Diabetes Father    Hypertension Father    Hypertension Sister    Diabetes Sister    Hypertension Brother    Diabetes Brother    Hyperlipidemia Neg Hx    Sudden death Neg Hx     Past Surgical History:  Procedure Laterality Date   ABDOMINAL HYSTERECTOMY     partial   APPENDECTOMY     BACK SURGERY     KNEE SURGERY     TOTAL KNEE ARTHROPLASTY Right 07/13/2017   TOTAL KNEE ARTHROPLASTY Right 07/13/2017   Procedure: RIGHT TOTAL KNEE ARTHROPLASTY;  Surgeon: Tarry Kos, MD;  Location: [facility] OR;  Service: Orthopedics;  Laterality: Right;   TOTAL KNEE ARTHROPLASTY Left 08/26/2021   Procedure: LEFT TOTAL  KNEE ARTHROPLASTY;  Surgeon: Tarry Kos, MD;  [facility];  Service: Orthopedics;  Laterality: Left;   Social History   Occupational History   Not on file  Tobacco Use   Smoking status: Never   Smokeless tobacco: Never  Vaping Use   Vaping Use: Never used  Substance and Sexual Activity   Alcohol use: No   Drug use: No   Sexual activity: Not Currently

## 2021-09-27 ENCOUNTER — Ambulatory Visit: Payer: BC Managed Care – PPO

## 2021-09-27 DIAGNOSIS — M25662 Stiffness of left knee, not elsewhere classified: Secondary | ICD-10-CM | POA: Diagnosis not present

## 2021-09-27 DIAGNOSIS — M6281 Muscle weakness (generalized): Secondary | ICD-10-CM | POA: Diagnosis not present

## 2021-09-27 DIAGNOSIS — R2689 Other abnormalities of gait and mobility: Secondary | ICD-10-CM | POA: Diagnosis not present

## 2021-09-27 DIAGNOSIS — R262 Difficulty in walking, not elsewhere classified: Secondary | ICD-10-CM | POA: Diagnosis not present

## 2021-09-27 DIAGNOSIS — M25562 Pain in left knee: Secondary | ICD-10-CM | POA: Diagnosis not present

## 2021-09-27 DIAGNOSIS — R29898 Other symptoms and signs involving the musculoskeletal system: Secondary | ICD-10-CM

## 2021-09-27 DIAGNOSIS — Z96652 Presence of left artificial knee joint: Secondary | ICD-10-CM | POA: Diagnosis not present

## 2021-09-27 DIAGNOSIS — R600 Localized edema: Secondary | ICD-10-CM

## 2021-09-27 NOTE — Therapy (Signed)
OUTPATIENT PHYSICAL THERAPY TREATMENT   Patient Name: Kristin Anthony MRN: [id number] DOB:23-Oct-1950, 71 y.o., female Today's Date: 09/27/2021    PT End of Session - 09/27/21 0914     Visit Number 3    Date for PT Re-Evaluation 11/13/21    Authorization Type [employer] - VL: 30    PT Start Time 0800    PT Stop Time 0844    PT Time Calculation (min) 44 min    Activity Tolerance Patient tolerated treatment well    Behavior During Therapy WFL for tasks assessed/performed               Past Medical History:  Diagnosis Date   Arthritis    Depression    Diabetes mellitus    DVT (deep venous thrombosis) (HCC)    High cholesterol    Hypertension    Past Surgical History:  Procedure Laterality Date   ABDOMINAL HYSTERECTOMY     partial   APPENDECTOMY     BACK SURGERY     KNEE SURGERY     TOTAL KNEE ARTHROPLASTY Right 07/13/2017   TOTAL KNEE ARTHROPLASTY Right 07/13/2017   Procedure: RIGHT TOTAL KNEE ARTHROPLASTY;  Surgeon: Tarry Kos, MD;  Location: [facility] OR;  Service: Orthopedics;  Laterality: Right;   TOTAL KNEE ARTHROPLASTY Left 08/26/2021   Procedure: LEFT TOTAL KNEE ARTHROPLASTY;  Surgeon: Tarry Kos, MD;  Location: [facility] OR;  Service: Orthopedics;  Laterality: Left;   Patient Active Problem List   Diagnosis Date Noted   Status post total left knee replacement 08/26/2021   Primary osteoarthritis of left knee 05/16/2021   Total knee replacement status 07/13/2017   Unilateral primary osteoarthritis, left knee 10/07/2016   Chronic pain of both knees 10/07/2016   Unilateral primary osteoarthritis, right knee 03/18/2016   Closed fracture of tuft of distal phalanx of right thumb 08/31/2012    PCP: Creola Corn, MD  REFERRING PROVIDER: Ivin Poot  REFERRING DIAG: [phone] (ICD-10-CM) - History of total knee replacement, left  THERAPY DIAG:  Acute pain of left knee  Stiffness of left knee, not elsewhere classified  Other abnormalities of gait and  mobility  Difficulty in walking, not elsewhere classified  Muscle weakness (generalized)  Localized edema  Other symptoms and signs involving the musculoskeletal system  RATIONALE FOR EVALUATION AND TREATMENT: Rehabilitation  ONSET DATE: 08/26/21 - L TKA  NEXT MD VISIT: 10/09/21   SUBJECTIVE:   SUBJECTIVE STATEMENT: Pt reports that she visited the doctor yesterday, everything went well.  PAIN:  Are you having pain? Yes: NPRS scale: 6/10 Pain location: L knee Pain description: ache, burning sometimes Aggravating factors: laying down in bed, walking (esp when first getting up) Relieving factors: pain meds, ice  PERTINENT HISTORY: L TKA 08/26/21; R TKA 07/13/17; back surgery; OA, DM; DVT; HTN; HLD; depression  PRECAUTIONS: None  WEIGHT BEARING RESTRICTIONS: No  FALLS:  Has patient fallen in last 6 months? No  LIVING ENVIRONMENT: Lives with: lives with their family and lives with their spouse Lives in: House/apartment Stairs: Yes: Internal: 6 steps; on right going up, on left going up, and can reach both and External: 3 steps; on left going up Has following equipment at home: Single point cane and Walker - 2 wheeled  OCCUPATION: Disabled since 2009  PLOF: Independent and Leisure: mostly sedentary, reading, church  PATIENT GOALS: "To really bend this knee and stand up/walk taller w/o pain."   OBJECTIVE:   DIAGNOSTIC FINDINGS:  N/A  PATIENT SURVEYS:  LEFS 20/80 = 25.0%; 75.0% disability  COGNITION: Overall cognitive status: Within functional limits for tasks assessed     SENSATION: Numbness in B feet  EDEMA:  Mild/mod L knee and distal LE edema  MUSCLE LENGTH: Hamstrings: mild/mod tight L ITB: mild/mod tight L Piriformis: NT Hip flexors: mod tight L Quads: mod tight L Heelcord: NT  POSTURE:  rounded shoulders, forward head, flexed trunk , and L knee flexed in stance  PALPATION: Patellar mobility: restricted all directions Increased muscle tension  and TTP in L quads and HS  LOWER EXTREMITY ROM:  Active ROM Right eval Left eval Left 09/27/21  Hip flexion     Hip extension     Hip abduction     Hip adduction     Hip internal rotation     Hip external rotation     Knee flexion 101 79 seated 81 supine 95  Knee extension 1 16 seated 18 supine 7  Ankle dorsiflexion     Ankle plantarflexion     Ankle inversion     Ankle eversion      Passive ROM Right eval Left eval  Knee flexion    Knee extension  14   (Blank rows = not tested)  LOWER EXTREMITY MMT: (tested in sitting on eval as pt unable to lay down for more than 30 sec at a time)  MMT Right eval Left eval  Hip flexion 4+ 4  Hip extension 4 4  Hip abduction 4+ 4-  Hip adduction 4 4-  Hip internal rotation    Hip external rotation    Knee flexion 4 4-  Knee extension 4+ 4-  Ankle dorsiflexion 4 4-  Ankle plantarflexion    Ankle inversion    Ankle eversion     (Blank rows = not tested)  GAIT: Distance walked: 80 Assistive device utilized: Single point cane and None Level of assistance: Modified independence Gait pattern: step to pattern, decreased step length- Right, decreased stance time- Left, decreased stride length, decreased hip/knee flexion- Left, knee flexed in stance- Left, and antalgic Comments: decreased gait speed with absent heel strike and limited heel-toe progression   TODAY'S TREATMENT: 09/27/21 Gait Training: With SPC - SBA - decreased hip/knee flexion, antalgic gait, trunk flexed 180 ft TherEx: Nustep L3x56min Seated hamstring + gastroc stretch with strap 3x20 sec hold Runner stretch 2x15 sec hold Seated LAQ x 10 Seated knee flexion AAROM with foot on peanut ball 5 reps alone  - 5 reps with overpressure from therapist  09/20/21 Therapeutic Exercise: Nustep L2x83min Seated knee flexion AAROM (one leg in front of other) x 10 Seated LAQ x 10 LLE Seated hamstring + gastroc stretch with strap x 30 sec Sit to stand x 10 - pt had tendency  to have LLE slightly forward  Gait Training: SPC 90 ft - good heel-toe progression, slow gait velocity, cues to restore proper gait sequence with cane  Manual Therapy:  Gentle PROM to L knee flexion + ext - pt requested to end session after 2 reps of knee ext PROM  09/18/21 THERAPEUTIC EXERCISE: Instruction in initial HEP (see below) to improve flexibility, strength and mobility.  Verbal and tactile cues throughout for technique.  GAIT TRAINING: To normalize gait pattern and improve safety with SPC . 12 ft with SPC - cues for sequencing of SPC on R in time with L foot as well as for heel strike to promote knee extension ROM and heel-toe progression with limb advancement    PATIENT EDUCATION:  Education details: HEP update Person educated: Patient Education method: Explanation, Demonstration, Verbal cues, and Handouts Education comprehension: verbalized understanding, returned demonstration, verbal cues required, and needs further education   HOME EXERCISE PROGRAM: Access Code: [email or web address] URL: [email or web address] Date: 09/27/2021 Prepared by: Verta Ellen  Exercises - Seated Hamstring Stretch with Strap  - 2-3 x daily - 7 x weekly - 3 reps - 30 sec hold - Seated Knee Flexion AAROM  - 2-3 x daily - 7 x weekly - 2 sets - 10 reps - 3 sec hold - Seated Long Arc Quad  - 2-3 x daily - 7 x weekly - 2 sets - 10 reps - 3 sec hold - Seated Passive Knee Extension  - 3 x daily - 7 x weekly - 3 reps - 30-60 sec hold - Gastroc Stretch on Wall  - 1 x daily - 7 x weekly - 3 sets - 30 sec hold - Sit to Stand with Armchair  - 1 x daily - 7 x weekly - 2 sets - 10 reps  ASSESSMENT:  CLINICAL IMPRESSION: Pt visited MD yesterday, states that everything went well, they also did Doppler to rule DVT and there was no sign of blood clot. Good response to the progression of interventions. Pt is showing good stability with SPC but still in favor of R LE and lack of hip/knee flexion. Trialed  runner stretch today to increase knee ext, this went well with clarification provided for neutral ankle position and also added to HEP. Knee ROM is showing good improvements both ways but would benefit from continue progression of ROM exercises to get full ROM of L knee.  OBJECTIVE IMPAIRMENTS: Abnormal gait, decreased activity tolerance, decreased balance, decreased endurance, decreased knowledge of condition, decreased knowledge of use of DME, decreased mobility, difficulty walking, decreased ROM, decreased strength, decreased safety awareness, increased edema, increased fascial restrictions, impaired perceived functional ability, increased muscle spasms, impaired flexibility, improper body mechanics, postural dysfunction, and pain.   ACTIVITY LIMITATIONS: carrying, lifting, bending, sitting, standing, squatting, sleeping, stairs, transfers, bed mobility, bathing, toileting, dressing, and locomotion level  PARTICIPATION LIMITATIONS: meal prep, cleaning, laundry, driving, shopping, community activity, yard work, and school  PERSONAL FACTORS: Age, Fitness, Past/current experiences, Time since onset of injury/illness/exacerbation, and 3+ comorbidities: L TKA 08/26/21; R TKA 07/13/17; back surgery; OA, DM; DVT; HTN; HLD; depression  are also affecting patient's functional outcome.   REHAB POTENTIAL: Good  CLINICAL DECISION MAKING: Stable/uncomplicated  EVALUATION COMPLEXITY: Low   GOALS: Goals reviewed with patient? Yes  SHORT TERM GOALS: Target date: 10/09/2021   Patient will be independent with initial HEP. Baseline:  Goal status: IN PROGRESS  2.  Patient will demonstrate improved L knee AROM to >/= 10-90 deg to improve gait pattern. Baseline: L knee AROM 16-81 deg Goal status: IN PROGRESS  LONG TERM GOALS: Target date: 11/13/2021   Patient will be independent with advanced/ongoing HEP to improve outcomes and carryover.  Baseline:  Goal status: IN PROGRESS  2.  Patient will report at  least 75% improvement in L knee pain to improve QOL. Baseline: 7/10 Goal status: IN PROGRESS  3.  Patient will demonstrate improved L knee AROM to >/= 3-110 deg to allow for normal gait and stair mechanics. Baseline: L knee AROM 16-81 deg Goal status: IN PROGRESS  4.  Patient will demonstrate improved B LE strength to >/= 4+/5 for improved stability and ease of mobility. Baseline: Refer to above MMT table Goal status: IN PROGRESS  5.  Patient will be  able to ambulate 600' with or w/o LRAD and normal gait pattern without increased pain to access community.  Baseline:  Goal status: IN PROGRESS  6. Patient will be able to ascend/descend stairs with 1 HR and reciprocal step pattern safely to access home and community.  Baseline:  Goal status: IN PROGRESS  7.  Patient will report >/= 30/80 on LEFS to demonstrate improved functional ability. Baseline: 20/80 = 25.0% (75.0% disability) Goal status: IN PROGRESS  8.  Patient will demonstrate at least 19/24 on DGI to decrease risk of falls. Baseline: NT Goal status: IN PROGRESS    PLAN: PT FREQUENCY: 2x/week  PT DURATION: 6-8 weeks  PLANNED INTERVENTIONS: Therapeutic exercises, Therapeutic activity, Neuromuscular re-education, Balance training, Gait training, Patient/Family education, Self Care, Joint mobilization, Stair training, DME instructions, Dry Needling, Electrical stimulation, Cryotherapy, Moist heat, scar mobilization, Taping, Vasopneumatic device, Ionotophoresis 4mg /ml Dexamethasone, Manual therapy, and Re-evaluation  PLAN FOR NEXT SESSION: Gait training with SPC to reduce antalgic gait and promote nomral gait pattern before weaning from AD; L knee ROM; stretching and MT to reduce muscle tension/spasm; patellar mobility; LE strengthening; HEP update as indicated   Artist Pais, PTA 09/27/2021, 9:14 AM

## 2021-10-01 ENCOUNTER — Encounter: Payer: BC Managed Care – PPO | Admitting: Physical Therapy

## 2021-10-01 DIAGNOSIS — E1140 Type 2 diabetes mellitus with diabetic neuropathy, unspecified: Secondary | ICD-10-CM | POA: Diagnosis not present

## 2021-10-01 DIAGNOSIS — E1122 Type 2 diabetes mellitus with diabetic chronic kidney disease: Secondary | ICD-10-CM | POA: Diagnosis not present

## 2021-10-02 ENCOUNTER — Ambulatory Visit: Payer: BC Managed Care – PPO | Admitting: Physical Therapy

## 2021-10-04 ENCOUNTER — Other Ambulatory Visit: Payer: Self-pay

## 2021-10-04 ENCOUNTER — Encounter (HOSPITAL_BASED_OUTPATIENT_CLINIC_OR_DEPARTMENT_OTHER): Payer: Self-pay

## 2021-10-04 ENCOUNTER — Ambulatory Visit: Payer: BC Managed Care – PPO

## 2021-10-04 ENCOUNTER — Emergency Department (HOSPITAL_BASED_OUTPATIENT_CLINIC_OR_DEPARTMENT_OTHER)
Admission: EM | Admit: 2021-10-04 | Discharge: 2021-10-04 | Disposition: A | Discharge status: Home / Self Care | Payer: BC Managed Care – PPO | Attending: Emergency Medicine | Admitting: Emergency Medicine

## 2021-10-04 DIAGNOSIS — M25662 Stiffness of left knee, not elsewhere classified: Secondary | ICD-10-CM

## 2021-10-04 DIAGNOSIS — R2689 Other abnormalities of gait and mobility: Secondary | ICD-10-CM

## 2021-10-04 DIAGNOSIS — R262 Difficulty in walking, not elsewhere classified: Secondary | ICD-10-CM

## 2021-10-04 DIAGNOSIS — I8392 Asymptomatic varicose veins of left lower extremity: Secondary | ICD-10-CM | POA: Insufficient documentation

## 2021-10-04 DIAGNOSIS — M79662 Pain in left lower leg: Secondary | ICD-10-CM | POA: Diagnosis not present

## 2021-10-04 DIAGNOSIS — R29898 Other symptoms and signs involving the musculoskeletal system: Secondary | ICD-10-CM | POA: Diagnosis not present

## 2021-10-04 DIAGNOSIS — Z96652 Presence of left artificial knee joint: Secondary | ICD-10-CM | POA: Diagnosis not present

## 2021-10-04 DIAGNOSIS — M6281 Muscle weakness (generalized): Secondary | ICD-10-CM | POA: Diagnosis not present

## 2021-10-04 DIAGNOSIS — R600 Localized edema: Secondary | ICD-10-CM

## 2021-10-04 DIAGNOSIS — Z7982 Long term (current) use of aspirin: Secondary | ICD-10-CM | POA: Diagnosis not present

## 2021-10-04 DIAGNOSIS — M25562 Pain in left knee: Secondary | ICD-10-CM

## 2021-10-04 DIAGNOSIS — I83812 Varicose veins of left lower extremities with pain: Secondary | ICD-10-CM | POA: Diagnosis not present

## 2021-10-04 MED ORDER — DICLOFENAC SODIUM 1.6 % EX GEL: 1.0000 | Freq: Two times a day (BID) | CUTANEOUS | 2 refills | Status: AC | PRN

## 2021-10-04 NOTE — ED Triage Notes (Signed)
Pt reports Left TKR on 08/26/21. Varicose veins have become sore since sx. Stooped wearing TED hose 1 week after sx. Negative for blood clot last week. Still going to therapy. Using tiger balm ointment

## 2021-10-04 NOTE — ED Provider Notes (Signed)
[facility] EMERGENCY DEPARTMENT Provider Note   CSN: [id number] Arrival date & time: 10/04/21  0847     History  Chief Complaint  Patient presents with   Varicose Veins    Kristin Anthony is a 71 y.o. female with history of left total knee replacement 1 month ago in August, presenting to ED with complaint of varicose vein swelling and pain in her left lower leg.  Of note, the patient had a DVT ultrasound that was -1-week ago for similar symptoms.  She reports that her postoperative knee pain and swelling has significantly improved, but she has noted the varicose veins bulging and are tender on the side of her left leg.  She has been applying ice to this.  Also Bengay and IcyHot.  She has not seen a specialist for her varicose veins.  HPI     Home Medications Prior to Admission medications   Medication Sig Start Date End Date Taking? Authorizing Provider  amLODipine (NORVASC) 10 MG tablet Take 10 mg by mouth at bedtime.   Yes [provider]  aspirin EC 81 MG tablet Take 1 tablet (81 mg total) by mouth 2 (two) times daily. Do not start taking until finished with xarelto 08/26/21 08/26/22 Yes Cristie Hem, PA-C  atorvastatin (LIPITOR) 40 MG tablet Take 40 mg by mouth at bedtime.   Yes [provider]  Diclofenac Sodium 1.6 % GEL Apply 1 Dose topically 2 (two) times daily as needed. Apply gently over varicose veins, avoiding hard rubbing 10/04/21  Yes Bonnee Zertuche, Kermit Balo, MD  hydrALAZINE (APRESOLINE) 25 MG tablet Take 25 mg by mouth daily. 03/25/21  Yes [provider]  insulin lispro (HUMALOG KWIKPEN) 100 UNIT/ML KiwkPen Inject 32 Units into the skin 2 (two) times daily.   Yes [provider]  methocarbamol (ROBAXIN) 500 MG tablet Take 1 tablet (500 mg total) by mouth 2 (two) times daily as needed. 08/18/21  Yes Cristie Hem, PA-C  olmesartan (BENICAR) 40 MG tablet Take 40 mg by mouth daily.   Yes [provider]   oxyCODONE-acetaminophen (PERCOCET) 5-325 MG tablet Take 1-2 tablets by mouth 2 (two) times daily as needed for severe pain. 09/26/21  Yes Tarry Kos, MD  pregabalin (LYRICA) 50 MG capsule Take 50 mg by mouth in the morning and at bedtime. 08/06/21  Yes [provider]  cephALEXin (KEFLEX) 500 MG capsule Take 1 capsule (500 mg total) by mouth 4 (four) times daily. To be taken after surgery 08/20/21   Cristie Hem, PA-C  cholecalciferol (VITAMIN D3) 25 MCG (1000 UNIT) tablet Take 1,000 Units by mouth every other day.    [provider]  docusate sodium (COLACE) 100 MG capsule Take 1 capsule (100 mg total) by mouth daily as needed. 08/18/21 08/18/22  Cristie Hem, PA-C  ketorolac (TORADOL) 10 MG tablet Take 1 tablet (10 mg total) by mouth 2 (two) times daily as needed. 09/12/21   Tarry Kos, MD  LORazepam (ATIVAN) 0.5 MG tablet Take 0.5 mg by mouth in the morning and at bedtime. 07/25/21   [provider]  montelukast (SINGULAIR) 10 MG tablet Take 10 mg by mouth at bedtime.    [provider]  ondansetron (ZOFRAN) 4 MG tablet Take 1 tablet (4 mg total) by mouth every 8 (eight) hours as needed for nausea or vomiting. 08/18/21   Cristie Hem, PA-C  oxyCODONE-acetaminophen (PERCOCET) 5-325 MG tablet Take 1-2 tablets by mouth every 6 (six) hours  as needed. To be taken after surgery 08/18/21   Aundra Dubin, PA-C  rivaroxaban (XARELTO) 10 MG TABS tablet Take 1 tablet (10 mg total) by mouth daily. Take this medication daily x 30 days INSTEAD of aspirin.  Once finished with this medication, take aspirin prescription as prescribed 08/26/21   Aundra Dubin, PA-C  tiZANidine (ZANAFLEX) 4 MG tablet Take 1 tablet (4 mg total) by mouth 2 (two) times daily as needed for muscle spasms. 09/26/21   Leandrew Koyanagi, MD  TOUJEO SOLOSTAR 300 UNIT/ML SOPN Inject 32 Units into the skin 2 (two) times daily.  11/21/15   [provider]      Allergies    Codeine    Review of  Systems   Review of Systems  Physical Exam Updated Vital Signs BP (!) 192/94   Pulse (!) 122   Temp 98.2 F (36.8 C) (Oral)   Resp 20   Ht 5\' 6"  (1.676 m)   Wt 87.5 kg   SpO2 99%   BMI 31.15 kg/m  Physical Exam Constitutional:      General: She is not in acute distress. HENT:     Head: Normocephalic and atraumatic.  Eyes:     Conjunctiva/sclera: Conjunctivae normal.     Pupils: Pupils are equal, round, and reactive to light.  Cardiovascular:     Rate and Rhythm: Normal rate and regular rhythm.  Pulmonary:     Effort: Pulmonary effort is normal. No respiratory distress.  Musculoskeletal:     Comments: Postoperative incision site over the left knee appears clean and intact, good range of motion of the left knee, no patellar effusion, warmth or tenderness Lower extremities are grossly symmetrical, perhaps some mild swelling of the left lower extremity below the operative site. Patient does have 2 notable varicose veins on the lateral aspect of her left leg which are tender to exam, with no nodular foci  Skin:    General: Skin is warm and dry.  Neurological:     General: No focal deficit present.     Mental Status: She is alert. Mental status is at baseline.  Psychiatric:        Mood and Affect: Mood normal.        Behavior: Behavior normal.     ED Results / Procedures / Treatments   Labs (all labs ordered are listed, but only abnormal results are displayed) Labs Reviewed - No data to display  EKG None  Radiology No results found.  Procedures Procedures    Medications Ordered in ED Medications - No data to display  ED Course/ Medical Decision Making/ A&P                           Medical Decision Making  Patient is here with left lower extremity leg pain, now 1 month postoperative.  I suspect this is related to her varicose veins which are likely small in the setting of her large operation.  With a negative DVT ultrasound a week ago have a lower suspicion  for deep venous thrombosis.  I do not see evidence of infection, less likely superficial thrombophlebitis as well.  I did recommend that she begin applying heat instead of ice, which may improve the blood flow into her legs.  She also has a compression stocking which she can try at home.  I will give her the phone number for vascular vein clinic if she continues to have pain from  this issue.  She can keep her legs elevated at home.  Also recommended trying Voltaren gel        Final Clinical Impression(s) / ED Diagnoses Final diagnoses:  Varicose veins of left lower extremity, unspecified whether complicated    Rx / DC Orders ED Discharge Orders          Ordered    Diclofenac Sodium 1.6 % GEL  2 times daily PRN        10/04/21 0927              Terald Sleeper, MD 10/04/21 [phone]

## 2021-10-04 NOTE — ED Notes (Signed)
Discharge instructions and recommendations reviewed with pt. States understanding. Ambulatory for discharge

## 2021-10-04 NOTE — Therapy (Addendum)
OUTPATIENT PHYSICAL THERAPY TREATMENT / DISCHARGE SUMMARY   Patient Name: Kristin Anthony MRN: [id number] DOB:Apr 24, 1950, 71 y.o., female Today's Date: 10/04/2021    PT End of Session - 10/04/21 0844     Visit Number 4    Date for PT Re-Evaluation 11/13/21    Authorization Type [employer] - VL: 23    PT Start Time 0801    PT Stop Time 0841    PT Time Calculation (min) 40 min    Activity Tolerance Patient tolerated treatment well    Behavior During Therapy WFL for tasks assessed/performed                Past Medical History:  Diagnosis Date   Arthritis    Depression    Diabetes mellitus    DVT (deep venous thrombosis) (HCC)    High cholesterol    Hypertension    Past Surgical History:  Procedure Laterality Date   ABDOMINAL HYSTERECTOMY     partial   APPENDECTOMY     BACK SURGERY     KNEE SURGERY     TOTAL KNEE ARTHROPLASTY Right 07/13/2017   TOTAL KNEE ARTHROPLASTY Right 07/13/2017   Procedure: RIGHT TOTAL KNEE ARTHROPLASTY;  Surgeon: Leandrew Koyanagi, MD;  Location: [facility];  Service: Orthopedics;  Laterality: Right;   TOTAL KNEE ARTHROPLASTY Left 08/26/2021   Procedure: LEFT TOTAL KNEE ARTHROPLASTY;  Surgeon: Leandrew Koyanagi, MD;  Location: [facility];  Service: Orthopedics;  Laterality: Left;   Patient Active Problem List   Diagnosis Date Noted   Status post total left knee replacement 08/26/2021   Primary osteoarthritis of left knee 05/16/2021   Total knee replacement status 07/13/2017   Unilateral primary osteoarthritis, left knee 10/07/2016   Chronic pain of both knees 10/07/2016   Unilateral primary osteoarthritis, right knee 03/18/2016   Closed fracture of tuft of distal phalanx of right thumb 08/31/2012    PCP: Shon Baton, MD  REFERRING PROVIDER: Nathaniel Man  REFERRING DIAG: [id number] (ICD-10-CM) - History of total knee replacement, left  THERAPY DIAG:  Acute pain of left knee  Stiffness of left knee, not elsewhere classified  Other  abnormalities of gait and mobility  Difficulty in walking, not elsewhere classified  Muscle weakness (generalized)  Localized edema  Other symptoms and signs involving the musculoskeletal system  RATIONALE FOR EVALUATION AND TREATMENT: Rehabilitation  ONSET DATE: 08/26/21 - L TKA  NEXT MD VISIT: 10/09/21   SUBJECTIVE:   SUBJECTIVE STATEMENT: Pt reports being worried about varicose veins, that she has only noticed since last MD appointment. Knee still remains painful.  PAIN:  Are you having pain? Yes: NPRS scale: 4/10 Pain location: L knee Pain description: ache Aggravating factors: laying down in bed, walking (esp when first getting up) Relieving factors: pain meds, ice  PERTINENT HISTORY: L TKA 08/26/21; R TKA 07/13/17; back surgery; OA, DM; DVT; HTN; HLD; depression  PRECAUTIONS: None  WEIGHT BEARING RESTRICTIONS: No  FALLS:  Has patient fallen in last 6 months? No  LIVING ENVIRONMENT: Lives with: lives with their family and lives with their spouse Lives in: House/apartment Stairs: Yes: Internal: 6 steps; on right going up, on left going up, and can reach both and External: 3 steps; on left going up Has following equipment at home: Single point cane and Walker - 2 wheeled  OCCUPATION: Disabled since 2009  PLOF: Independent and Leisure: mostly sedentary, reading, church  PATIENT GOALS: "To really bend this knee and stand up/walk taller w/o pain."  OBJECTIVE:   DIAGNOSTIC FINDINGS:  N/A  PATIENT SURVEYS:  LEFS 20/80 = 25.0%; 75.0% disability  COGNITION: Overall cognitive status: Within functional limits for tasks assessed     SENSATION: Numbness in B feet  EDEMA:  Mild/mod L knee and distal LE edema  MUSCLE LENGTH: Hamstrings: mild/mod tight L ITB: mild/mod tight L Piriformis: NT Hip flexors: mod tight L Quads: mod tight L Heelcord: NT  POSTURE:  rounded shoulders, forward head, flexed trunk , and L knee flexed in  stance  PALPATION: Patellar mobility: restricted all directions Increased muscle tension and TTP in L quads and HS  LOWER EXTREMITY ROM:  Active ROM Right eval Left eval Left 09/27/21 Left 10/04/21  Hip flexion      Hip extension      Hip abduction      Hip adduction      Hip internal rotation      Hip external rotation      Knee flexion 101 79 seated 81 supine 95 95  Knee extension 1 16 seated 18 supine 7 5  Ankle dorsiflexion      Ankle plantarflexion      Ankle inversion      Ankle eversion       Passive ROM Right eval Left eval  Knee flexion    Knee extension  14   (Blank rows = not tested)  LOWER EXTREMITY MMT: (tested in sitting on eval as pt unable to lay down for more than 30 sec at a time)  MMT Right eval Left eval  Hip flexion 4+ 4  Hip extension 4 4  Hip abduction 4+ 4-  Hip adduction 4 4-  Hip internal rotation    Hip external rotation    Knee flexion 4 4-  Knee extension 4+ 4-  Ankle dorsiflexion 4 4-  Ankle plantarflexion    Ankle inversion    Ankle eversion     (Blank rows = not tested)  GAIT: Distance walked: 80 Assistive device utilized: Single point cane and None Level of assistance: Modified independence Gait pattern: step to pattern, decreased step length- Right, decreased stance time- Left, decreased stride length, decreased hip/knee flexion- Left, knee flexed in stance- Left, and antalgic Comments: decreased gait speed with absent heel strike and limited heel-toe progression   TODAY'S TREATMENT: 10/04/21 Nustep L4x45mn Seated L LAQ 2x10 Seated L march 2x10 Standing L runner stretch 3x15" Standing WS onto L LE fwd and back x 10 Standing hip abduction x 10 bil - 2HA Standing hip extension x 10 bil - 2HA Seated L hamstring curl on peanut ball 2x10  Manual Therapy: L knee PROM into flexion and extension  09/27/21 Gait Training: With SPC - SBA - decreased hip/knee flexion, antalgic gait, trunk flexed 180 ft TherEx: Nustep  L3x675m Seated hamstring + gastroc stretch with strap 3x20 sec hold Runner stretch 2x15 sec hold Seated LAQ x 10 Seated knee flexion AAROM with foot on peanut ball 5 reps alone  - 5 reps with overpressure from therapist  09/20/21 Therapeutic Exercise: Nustep L2x6m45mSeated knee flexion AAROM (one leg in front of other) x 10 Seated LAQ x 10 LLE Seated hamstring + gastroc stretch with strap x 30 sec Sit to stand x 10 - pt had tendency to have LLE slightly forward  Gait Training: SPC 90 ft - good heel-toe progression, slow gait velocity, cues to restore proper gait sequence with cane  Manual Therapy:  Gentle PROM to L knee flexion + ext - pt requested  to end session after 2 reps of knee ext PROM  09/18/21 THERAPEUTIC EXERCISE: Instruction in initial HEP (see below) to improve flexibility, strength and mobility.  Verbal and tactile cues throughout for technique.  GAIT TRAINING: To normalize gait pattern and improve safety with SPC . 40 ft with SPC - cues for sequencing of SPC on R in time with L foot as well as for heel strike to promote knee extension ROM and heel-toe progression with limb advancement    PATIENT EDUCATION:  Education details: HEP update Person educated: Patient Education method: Consulting civil engineer, Demonstration, Verbal cues, and Handouts Education comprehension: verbalized understanding, returned demonstration, verbal cues required, and needs further education   HOME EXERCISE PROGRAM: Access Code: [email or web address] URL: [email or web address] Date: 10/04/2021 Prepared by: Clarene Essex  Exercises - Seated Hamstring Stretch with Strap  - 2-3 x daily - 7 x weekly - 3 reps - 30 sec hold - Seated Knee Flexion AAROM  - 2-3 x daily - 7 x weekly - 2 sets - 10 reps - 3 sec hold - Seated Long Arc Quad  - 2-3 x daily - 7 x weekly - 2 sets - 10 reps - 3 sec hold - Seated Passive Knee Extension  - 3 x daily - 7 x weekly - 3 reps - 30-60 sec hold - Gastroc Stretch on Wall  - 1  x daily - 7 x weekly - 3 sets - 30 sec hold - Sit to Stand with Armchair  - 1 x daily - 7 x weekly - 2 sets - 10 reps - Standing Hip Abduction with Counter Support  - 1 x daily - 7 x weekly - 2 sets - 10 reps - Standing Hip Extension with Counter Support  - 1 x daily - 7 x weekly - 2 sets - 10 reps  ASSESSMENT:  CLINICAL IMPRESSION: Inspected patient's L knee, found no increased swelling other than expected with TKA. She denied any pain, tenderness in calf and showed no discoloration of leg. She was tested for DVT at her last MD visit which was negative. We were able to keep progressing exercises, although patient becoming moderately fatigued today. Frequent postural cues required today in standing and to avoid leaning posteriorly with LAQ. Pt shows some improvement in knee ext ROM, flexion remains the same. Pt has met all STGs as of today.  OBJECTIVE IMPAIRMENTS: Abnormal gait, decreased activity tolerance, decreased balance, decreased endurance, decreased knowledge of condition, decreased knowledge of use of DME, decreased mobility, difficulty walking, decreased ROM, decreased strength, decreased safety awareness, increased edema, increased fascial restrictions, impaired perceived functional ability, increased muscle spasms, impaired flexibility, improper body mechanics, postural dysfunction, and pain.   ACTIVITY LIMITATIONS: carrying, lifting, bending, sitting, standing, squatting, sleeping, stairs, transfers, bed mobility, bathing, toileting, dressing, and locomotion level  PARTICIPATION LIMITATIONS: meal prep, cleaning, laundry, driving, shopping, community activity, yard work, and school  PERSONAL FACTORS: Age, Fitness, Past/current experiences, Time since onset of injury/illness/exacerbation, and 3+ comorbidities: L TKA 08/26/21; R TKA 07/13/17; back surgery; OA, DM; DVT; HTN; HLD; depression  are also affecting patient's functional outcome.   REHAB POTENTIAL: Good  CLINICAL DECISION MAKING:  Stable/uncomplicated  EVALUATION COMPLEXITY: Low   GOALS: Goals reviewed with patient? Yes  SHORT TERM GOALS: Target date: 10/09/2021   Patient will be independent with initial HEP. Baseline:  Goal status: MET - 10/04/21  2.  Patient will demonstrate improved L knee AROM to >/= 10-90 deg to improve gait pattern. Baseline: L knee AROM 16-81 deg Goal status:  MET - 10/04/21  LONG TERM GOALS: Target date: 11/13/2021   Patient will be independent with advanced/ongoing HEP to improve outcomes and carryover.  Baseline:  Goal status: IN PROGRESS  2.  Patient will report at least 75% improvement in L knee pain to improve QOL. Baseline: 7/10 Goal status: IN PROGRESS  3.  Patient will demonstrate improved L knee AROM to >/= 3-110 deg to allow for normal gait and stair mechanics. Baseline: L knee AROM 16-81 deg Goal status: IN PROGRESS  4.  Patient will demonstrate improved B LE strength to >/= 4+/5 for improved stability and ease of mobility. Baseline: Refer to above MMT table Goal status: IN PROGRESS  5.  Patient will be able to ambulate 600' with or w/o LRAD and normal gait pattern without increased pain to access community.  Baseline:  Goal status: IN PROGRESS  6. Patient will be able to ascend/descend stairs with 1 HR and reciprocal step pattern safely to access home and community.  Baseline:  Goal status: IN PROGRESS  7.  Patient will report >/= 30/80 on LEFS to demonstrate improved functional ability. Baseline: 20/80 = 25.0% (75.0% disability) Goal status: IN PROGRESS  8.  Patient will demonstrate at least 19/24 on DGI to decrease risk of falls. Baseline: NT Goal status: IN PROGRESS    PLAN: PT FREQUENCY: 2x/week  PT DURATION: 6-8 weeks  PLANNED INTERVENTIONS: Therapeutic exercises, Therapeutic activity, Neuromuscular re-education, Balance training, Gait training, Patient/Family education, Self Care, Joint mobilization, Stair training, DME instructions, Dry Needling,  Electrical stimulation, Cryotherapy, Moist heat, scar mobilization, Taping, Vasopneumatic device, Ionotophoresis 68m/ml Dexamethasone, Manual therapy, and Re-evaluation  PLAN FOR NEXT SESSION: Gait training with SPC to reduce antalgic gait and promote nomral gait pattern before weaning from AD; L knee ROM; stretching and MT to reduce muscle tension/spasm; patellar mobility; LE strengthening; HEP update as indicated   BArtist Pais PTA 10/04/2021, 8:44 AM   PHYSICAL THERAPY DISCHARGE SUMMARY  Visits from Start of Care: 4  Current functional level related to goals / functional outcomes:   Refer to above clinical impression and goal assessment for status as of last visit on 10/04/2021. Patient called today and cancelled all further stating she felt like was not progressing with  PT. When I called to find out what her concerns were, she stated that she felt like she was doing well with just her HEP and did not need to come back to PT. When I explained that she was not at the degree of L knee ROM that the MD is expecting post-op, she remained disinclined to come back to PT and still wanted to proceed with discharge.   Remaining deficits:   As above. Unable to formally assess status at discharge due to failure to return to PT.   Education / Equipment:   HEP   Patient agrees to discharge. Patient goals were partially met. Patient is being discharged due to the patient's request.  JPercival Spanish PT, MPT 10/11/21, 10:43 AM  [facility]ranHCarman [place] [address]Phone: [phone]  Fax:  [phone]

## 2021-10-08 ENCOUNTER — Encounter: Payer: BC Managed Care – PPO | Admitting: Physical Therapy

## 2021-10-09 ENCOUNTER — Encounter: Payer: BC Managed Care – PPO | Admitting: Orthopaedic Surgery

## 2021-10-09 ENCOUNTER — Ambulatory Visit: Payer: BC Managed Care – PPO

## 2021-10-11 ENCOUNTER — Ambulatory Visit: Payer: BC Managed Care – PPO

## 2021-10-15 ENCOUNTER — Telehealth: Payer: Self-pay | Admitting: Orthopaedic Surgery

## 2021-10-15 ENCOUNTER — Encounter: Payer: BC Managed Care – PPO | Admitting: Physical Therapy

## 2021-10-15 NOTE — Telephone Encounter (Signed)
Patient called. Would like to know if she could wear a knee brace or support stockings during the day? Her call back number is [phone]

## 2021-10-16 ENCOUNTER — Encounter: Payer: BC Managed Care – PPO | Admitting: Physical Therapy

## 2021-10-16 NOTE — Telephone Encounter (Signed)
Called and spoke with patient.

## 2021-10-16 NOTE — Telephone Encounter (Signed)
I would recommend a compressions sleeve

## 2021-10-22 ENCOUNTER — Encounter: Payer: BC Managed Care – PPO | Admitting: Physical Therapy

## 2021-10-30 ENCOUNTER — Encounter: Payer: BC Managed Care – PPO | Admitting: Physical Therapy

## 2021-11-01 NOTE — Telephone Encounter (Signed)
Done

## 2021-11-07 ENCOUNTER — Ambulatory Visit (INDEPENDENT_AMBULATORY_CARE_PROVIDER_SITE_OTHER): Payer: BC Managed Care – PPO

## 2021-11-07 ENCOUNTER — Encounter: Payer: Self-pay | Admitting: Orthopaedic Surgery

## 2021-11-07 ENCOUNTER — Ambulatory Visit: Payer: Self-pay

## 2021-11-07 ENCOUNTER — Ambulatory Visit (INDEPENDENT_AMBULATORY_CARE_PROVIDER_SITE_OTHER): Payer: BC Managed Care – PPO | Admitting: Orthopaedic Surgery

## 2021-11-07 DIAGNOSIS — Z96652 Presence of left artificial knee joint: Secondary | ICD-10-CM

## 2021-11-07 DIAGNOSIS — M25561 Pain in right knee: Secondary | ICD-10-CM

## 2021-11-07 DIAGNOSIS — M25562 Pain in left knee: Secondary | ICD-10-CM | POA: Diagnosis not present

## 2021-11-07 NOTE — Progress Notes (Signed)
Post-Op Visit Note   Patient: Kristin Anthony           Date of Birth: 1950-12-30           MRN: [id number] Visit Date: 11/07/2021 PCP: Shon Baton, MD   Assessment & Plan:  Chief Complaint:  Chief Complaint  Patient presents with   Left Knee - Follow-up    Left total knee arthroplasty 08/26/2021   Visit Diagnoses:  1. History of total knee replacement, left   2. Acute pain of right knee     Plan: Kristin Anthony returns today for follow-up from her left total knee replacement on 08/26/2021.  She is approximately 10 to 11 weeks postop.  She feels shin pain towards the end of the day.  Overall she has felt improvement since she has had the surgery.  She requested evaluation of the right knee as well.  Examination of the right knee shows a fully healed surgical scar.  She has painless fluid range of motion.  Flexion is limited to about 95 to 100 degrees.  Good varus valgus stability.  X-rays show implant in good alignment without any evidence of loosening or complications.  Examination of the left knee shows a healed surgical scar.  Range of motion is about 8 to 90 degrees with mild pain.  She does have diffuse edema.  No calf tenderness.  X-rays demonstrate implant in good alignment without any evidence of loosening or complications.  In regards to the right knee she is doing well and reassurance was provided that the implant is stable and not loose.  She is happy with that.  In regards to the left knee we will order compression socks as I think the majority of her symptoms stem from the edema.  She will continue to do physical therapy and I would like to recheck her in 3 months with two-view x-rays of the left knee.  Dental prophylaxis reinforced.  Follow-Up Instructions: Return in about 3 months (around 02/07/2022).   Orders:  Orders Placed This Encounter  Procedures   XR Knee 1-2 Views Right   XR Knee 1-2 Views Left   No orders of the defined types were placed in this  encounter.   Imaging: XR Knee 1-2 Views Right  Result Date: 11/07/2021 Stable total knee replacement in good alignment   XR Knee 1-2 Views Left  Result Date: 11/07/2021 Stable total knee replacement in good alignment.    PMFS History: Patient Active Problem List   Diagnosis Date Noted   Status post total left knee replacement 08/26/2021   Primary osteoarthritis of left knee 05/16/2021   Total knee replacement status 07/13/2017   Unilateral primary osteoarthritis, left knee 10/07/2016   Chronic pain of both knees 10/07/2016   Unilateral primary osteoarthritis, right knee 03/18/2016   Closed fracture of tuft of distal phalanx of right thumb 08/31/2012   Past Medical History:  Diagnosis Date   Arthritis    Depression    Diabetes mellitus    DVT (deep venous thrombosis) (HCC)    High cholesterol    Hypertension     Family History  Problem Relation Age of Onset   Heart attack Mother    Diabetes Father    Hypertension Father    Hypertension Sister    Diabetes Sister    Hypertension Brother    Diabetes Brother    Hyperlipidemia Neg Hx    Sudden death Neg Hx     Past Surgical History:  Procedure Laterality Date  ABDOMINAL HYSTERECTOMY     partial   APPENDECTOMY     BACK SURGERY     KNEE SURGERY     TOTAL KNEE ARTHROPLASTY Right 07/13/2017   TOTAL KNEE ARTHROPLASTY Right 07/13/2017   Procedure: RIGHT TOTAL KNEE ARTHROPLASTY;  Surgeon: Tarry Kos, MD;  Location: [facility];  Service: Orthopedics;  Laterality: Right;   TOTAL KNEE ARTHROPLASTY Left 08/26/2021   Procedure: LEFT TOTAL KNEE ARTHROPLASTY;  Surgeon: Tarry Kos, MD;  Location: [facility];  Service: Orthopedics;  Laterality: Left;   Social History   Occupational History   Not on file  Tobacco Use   Smoking status: Never   Smokeless tobacco: Never  Vaping Use   Vaping Use: Never used  Substance and Sexual Activity   Alcohol use: No   Drug use: No   Sexual activity: Not Currently

## 2021-12-24 DIAGNOSIS — H35033 Hypertensive retinopathy, bilateral: Secondary | ICD-10-CM | POA: Diagnosis not present

## 2021-12-24 DIAGNOSIS — H43811 Vitreous degeneration, right eye: Secondary | ICD-10-CM | POA: Diagnosis not present

## 2021-12-24 DIAGNOSIS — Z961 Presence of intraocular lens: Secondary | ICD-10-CM | POA: Diagnosis not present

## 2021-12-24 DIAGNOSIS — H35373 Puckering of macula, bilateral: Secondary | ICD-10-CM | POA: Diagnosis not present

## 2021-12-24 DIAGNOSIS — H52223 Regular astigmatism, bilateral: Secondary | ICD-10-CM | POA: Diagnosis not present

## 2021-12-24 DIAGNOSIS — E119 Type 2 diabetes mellitus without complications: Secondary | ICD-10-CM | POA: Diagnosis not present

## 2021-12-24 DIAGNOSIS — H524 Presbyopia: Secondary | ICD-10-CM | POA: Diagnosis not present

## 2021-12-24 DIAGNOSIS — H5213 Myopia, bilateral: Secondary | ICD-10-CM | POA: Diagnosis not present

## 2022-01-16 DIAGNOSIS — E1122 Type 2 diabetes mellitus with diabetic chronic kidney disease: Secondary | ICD-10-CM | POA: Diagnosis not present

## 2022-01-23 DIAGNOSIS — B372 Candidiasis of skin and nail: Secondary | ICD-10-CM | POA: Diagnosis not present

## 2022-01-23 DIAGNOSIS — R21 Rash and other nonspecific skin eruption: Secondary | ICD-10-CM | POA: Diagnosis not present

## 2022-01-29 DIAGNOSIS — I10 Essential (primary) hypertension: Secondary | ICD-10-CM | POA: Diagnosis not present

## 2022-01-29 DIAGNOSIS — Z794 Long term (current) use of insulin: Secondary | ICD-10-CM | POA: Diagnosis not present

## 2022-01-29 DIAGNOSIS — E119 Type 2 diabetes mellitus without complications: Secondary | ICD-10-CM | POA: Diagnosis not present

## 2022-01-29 DIAGNOSIS — E7800 Pure hypercholesterolemia, unspecified: Secondary | ICD-10-CM | POA: Diagnosis not present

## 2022-02-05 ENCOUNTER — Ambulatory Visit: Payer: BC Managed Care – PPO | Admitting: Orthopaedic Surgery

## 2022-02-05 ENCOUNTER — Ambulatory Visit (INDEPENDENT_AMBULATORY_CARE_PROVIDER_SITE_OTHER): Payer: BC Managed Care – PPO

## 2022-02-05 DIAGNOSIS — Z96652 Presence of left artificial knee joint: Secondary | ICD-10-CM

## 2022-02-05 NOTE — Progress Notes (Signed)
Office Visit Note   Patient: Kristin Anthony           Date of Birth: May 25, 1950           MRN: [id number] Visit Date: 02/05/2022              Requested by: Shon Baton, [place],  [place] [id number] PCP: Shon Baton, MD   Assessment & Plan: Visit Diagnoses:  1. History of total knee replacement, left     Plan: Kristin Anthony is doing well 6 months status post left total knee replacement.  Dental prophylaxis reinforced.  Recheck in 6 months with two-view x-rays of the left knee.  Follow-Up Instructions: Return in about 6 months (around 08/06/2022).   Orders:  Orders Placed This Encounter  Procedures   XR Knee 1-2 Views Left   No orders of the defined types were placed in this encounter.     Procedures: No procedures performed   Clinical Data: No additional findings.   Subjective: Chief Complaint  Patient presents with   Left Knee - Follow-up    HPI Kristin Anthony is's almost 6 months status post left total knee replacement.  She has been very happy overall and has been doing great.  She has no pain or complaints. Review of Systems   Objective: Vital Signs: There were no vitals taken for this visit.  Physical Exam  Ortho Exam Examination of the left knee shows fully healed surgical scar.  Good normal painless range of motion.  Good varus valgus stability. Specialty Comments:  No specialty comments available.  Imaging: No results found.   PMFS History: Patient Active Problem List   Diagnosis Date Noted   Status post total left knee replacement 08/26/2021   Primary osteoarthritis of left knee 05/16/2021   Total knee replacement status 07/13/2017   Unilateral primary osteoarthritis, left knee 10/07/2016   Chronic pain of both knees 10/07/2016   Unilateral primary osteoarthritis, right knee 03/18/2016   Closed fracture of tuft of distal phalanx of right thumb 08/31/2012   Past Medical History:  Diagnosis Date   Arthritis    Depression    Diabetes  mellitus    DVT (deep venous thrombosis) (HCC)    High cholesterol    Hypertension     Family History  Problem Relation Age of Onset   Heart attack Mother    Diabetes Father    Hypertension Father    Hypertension Sister    Diabetes Sister    Hypertension Brother    Diabetes Brother    Hyperlipidemia Neg Hx    Sudden death Neg Hx     Past Surgical History:  Procedure Laterality Date   ABDOMINAL HYSTERECTOMY     partial   APPENDECTOMY     BACK SURGERY     KNEE SURGERY     TOTAL KNEE ARTHROPLASTY Right 07/13/2017   TOTAL KNEE ARTHROPLASTY Right 07/13/2017   Procedure: RIGHT TOTAL KNEE ARTHROPLASTY;  Surgeon: Leandrew Koyanagi, MD;  Location: [facility];  Service: Orthopedics;  Laterality: Right;   TOTAL KNEE ARTHROPLASTY Left 08/26/2021   Procedure: LEFT TOTAL KNEE ARTHROPLASTY;  Surgeon: Leandrew Koyanagi, MD;  Location: [facility];  Service: Orthopedics;  Laterality: Left;   Social History   Occupational History   Not on file  Tobacco Use   Smoking status: Never   Smokeless tobacco: Never  Vaping Use   Vaping Use: Never used  Substance and Sexual Activity   Alcohol use: No   Drug  use: No   Sexual activity: Not Currently

## 2022-02-07 ENCOUNTER — Ambulatory Visit: Payer: BC Managed Care – PPO | Admitting: Orthopaedic Surgery

## 2022-02-14 ENCOUNTER — Telehealth: Payer: Self-pay | Admitting: Orthopaedic Surgery

## 2022-02-14 NOTE — Telephone Encounter (Signed)
Appt for 07/24 needs to be rescheduled called pt 02/14/22 no answer VM not set up

## 2022-03-27 ENCOUNTER — Ambulatory Visit: Payer: BC Managed Care – PPO | Admitting: Orthopaedic Surgery

## 2022-03-27 DIAGNOSIS — M7052 Other bursitis of knee, left knee: Secondary | ICD-10-CM

## 2022-03-27 DIAGNOSIS — Z96652 Presence of left artificial knee joint: Secondary | ICD-10-CM | POA: Diagnosis not present

## 2022-03-27 MED ORDER — BUPIVACAINE HCL 0.5 % IJ SOLN
2.0000 mL | INTRAMUSCULAR | Status: AC | PRN
  Administered 2022-03-27: 2 mL via INTRA_ARTICULAR

## 2022-03-27 MED ORDER — LIDOCAINE HCL 1 % IJ SOLN
2.0000 mL | INTRAMUSCULAR | Status: AC | PRN
  Administered 2022-03-27: 2 mL

## 2022-03-27 MED ORDER — METHYLPREDNISOLONE ACETATE 40 MG/ML IJ SUSP
40.0000 mg | INTRAMUSCULAR | Status: AC | PRN
  Administered 2022-03-27: 40 mg via INTRA_ARTICULAR

## 2022-03-27 NOTE — Progress Notes (Signed)
Procedure Note  Patient: Kristin Anthony             Date of Birth: 08-25-1950           MRN: [id number]             Visit Date: 03/27/2022  Procedures: Visit Diagnoses:  1. Status post total left knee replacement   2. Pes anserinus bursitis of left knee     Large Joint Inj: L knee on 03/27/2022 9:40 PM Details: 22 G needle Medications: 2 mL bupivacaine 0.5 %; 2 mL lidocaine 1 %; 40 mg methylPREDNISolone acetate 40 MG/ML Outcome: tolerated well, no immediate complications Patient was prepped and draped in the usual sterile fashion.         Post-Op Visit Note   Patient: Kristin Anthony           Date of Birth: 09-09-1950           MRN: [id number] Visit Date: 03/27/2022 PCP: Shon Baton, MD   Assessment & Plan:  Chief Complaint:  Chief Complaint  Patient presents with   Left Knee - Pain   Visit Diagnoses:  1. Status post total left knee replacement   2. Pes anserinus bursitis of left knee     Plan: Patient is a pleasant 72 year old female who comes in today with pain to the anterior aspect of the left knee.  This began about a week ago.  No injury or change in activity.  The pain is located to the pes bursa.  She has noticed some fluctuation and swelling around the area.  Symptoms occur with walking, sitting or lying down.  She has tried Tylenol without relief.  She denies any calf pain.  Examination of her left knee reveals a fully healed surgical scar without complication.  Range of motion 0 to 120 degrees.  She is stable to valgus and varus stress.  She does have moderate tenderness to the pes bursa.  There is associated swelling.  She is neurovascular intact distally.  Impression is left knee pes bursitis.  We discussed proceeding with cortisone injection of the pes bursa for which she would like to proceed.  I am not concerned at all for DVT.  She will follow-up with [place] in July when she is 1 year out from her left total knee replacement.  Call with concerns or  questions in the meantime.  Follow-Up Instructions: Return if symptoms worsen or fail to improve.   Orders:  No orders of the defined types were placed in this encounter.  No orders of the defined types were placed in this encounter.   Imaging: No new imaging  PMFS History: Patient Active Problem List   Diagnosis Date Noted   Status post total left knee replacement 08/26/2021   Primary osteoarthritis of left knee 05/16/2021   Total knee replacement status 07/13/2017   Unilateral primary osteoarthritis, left knee 10/07/2016   Chronic pain of both knees 10/07/2016   Unilateral primary osteoarthritis, right knee 03/18/2016   Closed fracture of tuft of distal phalanx of right thumb 08/31/2012   Past Medical History:  Diagnosis Date   Arthritis    Depression    Diabetes mellitus    DVT (deep venous thrombosis) (HCC)    High cholesterol    Hypertension     Family History  Problem Relation Age of Onset   Heart attack Mother    Diabetes Father    Hypertension Father    Hypertension Sister  Diabetes Sister    Hypertension Brother    Diabetes Brother    Hyperlipidemia Neg Hx    Sudden death Neg Hx     Past Surgical History:  Procedure Laterality Date   ABDOMINAL HYSTERECTOMY     partial   APPENDECTOMY     BACK SURGERY     KNEE SURGERY     TOTAL KNEE ARTHROPLASTY Right 07/13/2017   TOTAL KNEE ARTHROPLASTY Right 07/13/2017   Procedure: RIGHT TOTAL KNEE ARTHROPLASTY;  Surgeon: Leandrew Koyanagi, MD;  Location: [facility];  Service: Orthopedics;  Laterality: Right;   TOTAL KNEE ARTHROPLASTY Left 08/26/2021   Procedure: LEFT TOTAL KNEE ARTHROPLASTY;  Surgeon: Leandrew Koyanagi, MD;  Location: [facility];  Service: Orthopedics;  Laterality: Left;   Social History   Occupational History   Not on file  Tobacco Use   Smoking status: Never   Smokeless tobacco: Never  Vaping Use   Vaping Use: Never used  Substance and Sexual Activity   Alcohol use: No   Drug use: No   Sexual activity:  Not Currently

## 2022-04-02 DIAGNOSIS — Z1231 Encounter for screening mammogram for malignant neoplasm of breast: Secondary | ICD-10-CM | POA: Diagnosis not present

## 2022-05-12 ENCOUNTER — Telehealth: Payer: Self-pay

## 2022-05-12 DIAGNOSIS — Z1330 Encounter for screening examination for mental health and behavioral disorders, unspecified: Secondary | ICD-10-CM | POA: Diagnosis not present

## 2022-05-12 DIAGNOSIS — Z794 Long term (current) use of insulin: Secondary | ICD-10-CM | POA: Diagnosis not present

## 2022-05-12 DIAGNOSIS — I10 Essential (primary) hypertension: Secondary | ICD-10-CM | POA: Diagnosis not present

## 2022-05-12 DIAGNOSIS — R072 Precordial pain: Secondary | ICD-10-CM | POA: Diagnosis not present

## 2022-05-12 DIAGNOSIS — E119 Type 2 diabetes mellitus without complications: Secondary | ICD-10-CM | POA: Diagnosis not present

## 2022-05-12 NOTE — Telephone Encounter (Signed)
Pt called and states that she is s/p a left TKR 08/2021 and siad that over the past few weeks she has developed a new symptom. She said that it feels like she is " urinating on myself but my leg is not wet" I asked the pt and she did have a L spine surgery with Dr. Yetta Barre in 2009. She wants to know if you think this could be related to her knee cb [phone]

## 2022-05-12 NOTE — Telephone Encounter (Signed)
No it's not related to the knee.  Definitely make appt to see Jones . Thanks.

## 2022-05-13 NOTE — Telephone Encounter (Signed)
Called and spoke with patient. She will contact Dr.Jones.

## 2022-05-23 DIAGNOSIS — E785 Hyperlipidemia, unspecified: Secondary | ICD-10-CM | POA: Diagnosis not present

## 2022-05-23 DIAGNOSIS — Z1212 Encounter for screening for malignant neoplasm of rectum: Secondary | ICD-10-CM | POA: Diagnosis not present

## 2022-05-23 DIAGNOSIS — E1122 Type 2 diabetes mellitus with diabetic chronic kidney disease: Secondary | ICD-10-CM | POA: Diagnosis not present

## 2022-05-23 DIAGNOSIS — R946 Abnormal results of thyroid function studies: Secondary | ICD-10-CM | POA: Diagnosis not present

## 2022-05-23 DIAGNOSIS — D649 Anemia, unspecified: Secondary | ICD-10-CM | POA: Diagnosis not present

## 2022-05-23 DIAGNOSIS — E559 Vitamin D deficiency, unspecified: Secondary | ICD-10-CM | POA: Diagnosis not present
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
## 2022-05-30 DIAGNOSIS — E1140 Type 2 diabetes mellitus with diabetic neuropathy, unspecified: Secondary | ICD-10-CM | POA: Diagnosis not present

## 2022-05-30 DIAGNOSIS — I129 Hypertensive chronic kidney disease with stage 1 through stage 4 chronic kidney disease, or unspecified chronic kidney disease: Secondary | ICD-10-CM | POA: Diagnosis not present

## 2022-05-30 DIAGNOSIS — Z1331 Encounter for screening for depression: Secondary | ICD-10-CM | POA: Diagnosis not present

## 2022-05-30 DIAGNOSIS — R82998 Other abnormal findings in urine: Secondary | ICD-10-CM | POA: Diagnosis not present

## 2022-05-30 DIAGNOSIS — Z794 Long term (current) use of insulin: Secondary | ICD-10-CM | POA: Diagnosis not present

## 2022-05-30 DIAGNOSIS — E1122 Type 2 diabetes mellitus with diabetic chronic kidney disease: Secondary | ICD-10-CM | POA: Diagnosis not present

## 2022-05-30 DIAGNOSIS — Z0000 Encounter for general adult medical examination without abnormal findings: Secondary | ICD-10-CM | POA: Diagnosis not present

## 2022-08-06 ENCOUNTER — Ambulatory Visit: Payer: BC Managed Care – PPO | Admitting: Orthopaedic Surgery

## 2022-10-05 DIAGNOSIS — M62838 Other muscle spasm: Secondary | ICD-10-CM | POA: Diagnosis not present

## 2022-12-05 DIAGNOSIS — D649 Anemia, unspecified: Secondary | ICD-10-CM | POA: Diagnosis not present

## 2022-12-05 DIAGNOSIS — N1831 Chronic kidney disease, stage 3a: Secondary | ICD-10-CM | POA: Diagnosis not present

## 2022-12-05 DIAGNOSIS — E1122 Type 2 diabetes mellitus with diabetic chronic kidney disease: Secondary | ICD-10-CM | POA: Diagnosis not present

## 2022-12-05 DIAGNOSIS — I129 Hypertensive chronic kidney disease with stage 1 through stage 4 chronic kidney disease, or unspecified chronic kidney disease: Secondary | ICD-10-CM | POA: Diagnosis not present

## 2023-01-28 DIAGNOSIS — D649 Anemia, unspecified: Secondary | ICD-10-CM | POA: Diagnosis not present

## 2023-01-28 DIAGNOSIS — K5900 Constipation, unspecified: Secondary | ICD-10-CM | POA: Diagnosis not present

## 2023-01-28 DIAGNOSIS — K921 Melena: Secondary | ICD-10-CM | POA: Diagnosis not present

## 2023-01-29 ENCOUNTER — Ambulatory Visit
Admission: RE | Admit: 2023-01-29 | Discharge: 2023-01-29 | Disposition: A | Discharge status: Home / Self Care | Payer: BC Managed Care – PPO | Source: Ambulatory Visit | Attending: Nurse Practitioner | Admitting: Nurse Practitioner

## 2023-01-29 ENCOUNTER — Other Ambulatory Visit: Payer: Self-pay | Admitting: Nurse Practitioner

## 2023-01-29 DIAGNOSIS — K5900 Constipation, unspecified: Secondary | ICD-10-CM | POA: Diagnosis not present

## 2023-03-09 DIAGNOSIS — D128 Benign neoplasm of rectum: Secondary | ICD-10-CM | POA: Diagnosis not present

## 2023-03-09 DIAGNOSIS — Z09 Encounter for follow-up examination after completed treatment for conditions other than malignant neoplasm: Secondary | ICD-10-CM | POA: Diagnosis not present

## 2023-03-09 DIAGNOSIS — K317 Polyp of stomach and duodenum: Secondary | ICD-10-CM | POA: Diagnosis not present

## 2023-03-09 DIAGNOSIS — K648 Other hemorrhoids: Secondary | ICD-10-CM | POA: Diagnosis not present

## 2023-03-09 DIAGNOSIS — K2930 Chronic superficial gastritis without bleeding: Secondary | ICD-10-CM | POA: Diagnosis not present

## 2023-03-09 DIAGNOSIS — Z860100 Personal history of colon polyps, unspecified: Secondary | ICD-10-CM | POA: Diagnosis not present

## 2023-03-09 DIAGNOSIS — D509 Iron deficiency anemia, unspecified: Secondary | ICD-10-CM | POA: Diagnosis not present

## 2023-05-29 DIAGNOSIS — D649 Anemia, unspecified: Secondary | ICD-10-CM | POA: Diagnosis not present

## 2023-05-29 DIAGNOSIS — E559 Vitamin D deficiency, unspecified: Secondary | ICD-10-CM | POA: Diagnosis not present

## 2023-05-29 DIAGNOSIS — E1122 Type 2 diabetes mellitus with diabetic chronic kidney disease: Secondary | ICD-10-CM | POA: Diagnosis not present

## 2023-05-29 DIAGNOSIS — R946 Abnormal results of thyroid function studies: Secondary | ICD-10-CM | POA: Diagnosis not present

## 2023-05-29 DIAGNOSIS — I129 Hypertensive chronic kidney disease with stage 1 through stage 4 chronic kidney disease, or unspecified chronic kidney disease: Secondary | ICD-10-CM | POA: Diagnosis not present

## 2023-06-05 DIAGNOSIS — E1122 Type 2 diabetes mellitus with diabetic chronic kidney disease: Secondary | ICD-10-CM | POA: Diagnosis not present

## 2023-06-05 DIAGNOSIS — Z1331 Encounter for screening for depression: Secondary | ICD-10-CM | POA: Diagnosis not present

## 2023-06-05 DIAGNOSIS — Z23 Encounter for immunization: Secondary | ICD-10-CM | POA: Diagnosis not present

## 2023-06-05 DIAGNOSIS — Z0000 Encounter for general adult medical examination without abnormal findings: Secondary | ICD-10-CM | POA: Diagnosis not present

## 2023-06-05 DIAGNOSIS — Z1389 Encounter for screening for other disorder: Secondary | ICD-10-CM | POA: Diagnosis not present

## 2023-07-14 DIAGNOSIS — L603 Nail dystrophy: Secondary | ICD-10-CM | POA: Diagnosis not present

## 2023-07-14 DIAGNOSIS — L280 Lichen simplex chronicus: Secondary | ICD-10-CM | POA: Diagnosis not present

## 2023-07-14 DIAGNOSIS — L821 Other seborrheic keratosis: Secondary | ICD-10-CM | POA: Diagnosis not present

## 2023-11-18 ENCOUNTER — Ambulatory Visit: Admitting: Orthopaedic Surgery

## 2024-01-08 DIAGNOSIS — Z1231 Encounter for screening mammogram for malignant neoplasm of breast: Secondary | ICD-10-CM | POA: Diagnosis not present

## 2024-01-27 ENCOUNTER — Other Ambulatory Visit (HOSPITAL_COMMUNITY): Payer: Self-pay
